# Patient Record
Sex: Male | Born: 1935 | Race: White | Hispanic: No | Marital: Married | Smoking: Former smoker
Health system: Southern US, Community
[De-identification: ages and names within clinical notes are randomized; demographics above are authoritative.]

## PROBLEM LIST (undated history)

## (undated) DIAGNOSIS — R410 Disorientation, unspecified: Secondary | ICD-10-CM

## (undated) DIAGNOSIS — J9811 Atelectasis: Secondary | ICD-10-CM

## (undated) DIAGNOSIS — I214 Non-ST elevation (NSTEMI) myocardial infarction: Secondary | ICD-10-CM

## (undated) DIAGNOSIS — R55 Syncope and collapse: Secondary | ICD-10-CM

## (undated) DIAGNOSIS — J45909 Unspecified asthma, uncomplicated: Secondary | ICD-10-CM

## (undated) DIAGNOSIS — F0390 Unspecified dementia without behavioral disturbance: Secondary | ICD-10-CM

## (undated) DIAGNOSIS — F419 Anxiety disorder, unspecified: Secondary | ICD-10-CM

## (undated) DIAGNOSIS — R0602 Shortness of breath: Secondary | ICD-10-CM

## (undated) DIAGNOSIS — Z9981 Dependence on supplemental oxygen: Secondary | ICD-10-CM

## (undated) DIAGNOSIS — J189 Pneumonia, unspecified organism: Secondary | ICD-10-CM

## (undated) DIAGNOSIS — J449 Chronic obstructive pulmonary disease, unspecified: Secondary | ICD-10-CM

## (undated) DIAGNOSIS — I10 Essential (primary) hypertension: Secondary | ICD-10-CM

## (undated) HISTORY — PX: INGUINAL HERNIA REPAIR: SUR1180

## (undated) HISTORY — DX: Essential (primary) hypertension: I10

## (undated) HISTORY — DX: Disorientation, unspecified: R41.0

## (undated) HISTORY — DX: Syncope and collapse: R55

## (undated) HISTORY — DX: Atelectasis: J98.11

## (undated) HISTORY — DX: Non-ST elevation (NSTEMI) myocardial infarction: I21.4

## (undated) HISTORY — PX: CARDIAC CATHETERIZATION: SHX172

## (undated) HISTORY — DX: Pneumonia, unspecified organism: J18.9

---

## 2003-12-18 ENCOUNTER — Inpatient Hospital Stay (HOSPITAL_COMMUNITY): Admission: EM | Admit: 2003-12-18 | Discharge: 2003-12-20 | Payer: Self-pay | Admitting: Emergency Medicine

## 2003-12-19 ENCOUNTER — Encounter: Payer: Self-pay | Admitting: Cardiology

## 2004-01-04 ENCOUNTER — Inpatient Hospital Stay (HOSPITAL_COMMUNITY): Admission: EM | Admit: 2004-01-04 | Discharge: 2004-01-11 | Payer: Self-pay | Admitting: Emergency Medicine

## 2004-01-13 ENCOUNTER — Emergency Department (HOSPITAL_COMMUNITY): Admission: AD | Admit: 2004-01-13 | Discharge: 2004-01-13 | Payer: Self-pay | Admitting: Family Medicine

## 2004-02-11 ENCOUNTER — Inpatient Hospital Stay (HOSPITAL_COMMUNITY): Admission: EM | Admit: 2004-02-11 | Discharge: 2004-02-13 | Payer: Self-pay

## 2004-06-28 ENCOUNTER — Encounter: Admission: RE | Admit: 2004-06-28 | Discharge: 2004-06-28 | Payer: Self-pay | Admitting: Family Medicine

## 2004-07-14 ENCOUNTER — Encounter: Admission: RE | Admit: 2004-07-14 | Discharge: 2004-07-14 | Payer: Self-pay | Admitting: Family Medicine

## 2004-12-06 ENCOUNTER — Encounter: Admission: RE | Admit: 2004-12-06 | Discharge: 2004-12-06 | Payer: Self-pay | Admitting: Family Medicine

## 2006-01-19 ENCOUNTER — Encounter: Admission: RE | Admit: 2006-01-19 | Discharge: 2006-01-19 | Payer: Self-pay | Admitting: Family Medicine

## 2006-06-21 ENCOUNTER — Encounter: Admission: RE | Admit: 2006-06-21 | Discharge: 2006-06-21 | Payer: Self-pay | Admitting: Family Medicine

## 2008-02-10 ENCOUNTER — Emergency Department (HOSPITAL_COMMUNITY): Admission: EM | Admit: 2008-02-10 | Discharge: 2008-02-11 | Payer: Self-pay | Admitting: Emergency Medicine

## 2008-02-16 ENCOUNTER — Ambulatory Visit: Payer: Self-pay | Admitting: Internal Medicine

## 2008-02-16 ENCOUNTER — Inpatient Hospital Stay (HOSPITAL_COMMUNITY): Admission: EM | Admit: 2008-02-16 | Discharge: 2008-02-18 | Payer: Self-pay | Admitting: Emergency Medicine

## 2009-02-03 IMAGING — CT CT ANGIO CHEST
2 of 4 series · 18 of 36 positions shown · IV contrast (100 ML OMNI 300)
Comparison: Today's plain film.  Chest CT 01/05/2004.

CLINICAL DATA: *Fever; ; DYSPNEA.  WHEEZING.  COPD.  FEVER.
PNEUMONIA.

CT Angiography of the Chest.
TECHNIQUE: Multidetector CT angiography of the chest was performed
after contrast with bolus timed to evaluate the pulmonary arteries.
Contrast:   100 ml Imnipaque-QMM

[Series 2: pe · axial · 0.70mm/px · z∈[-322,-40]mm · 15 of 256 slices shown]
[im 15/256  lung]
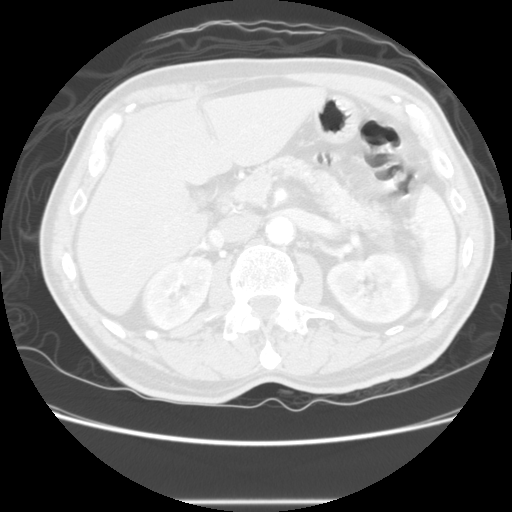
[im 29/256  mediastinal]
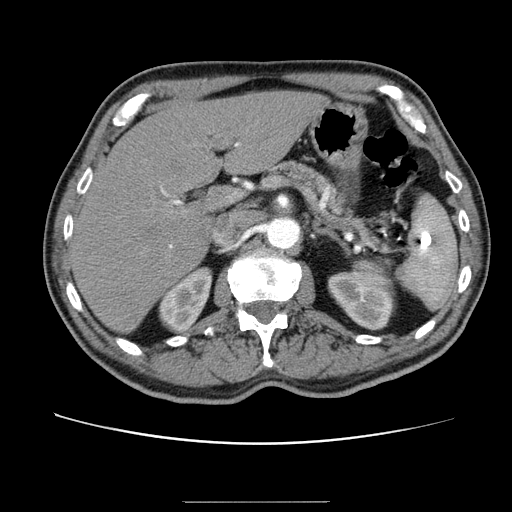
[im 43/256  lung]
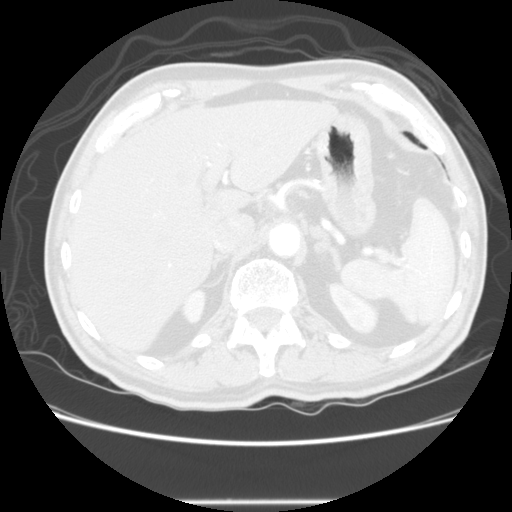
[im 57/256  mediastinal]
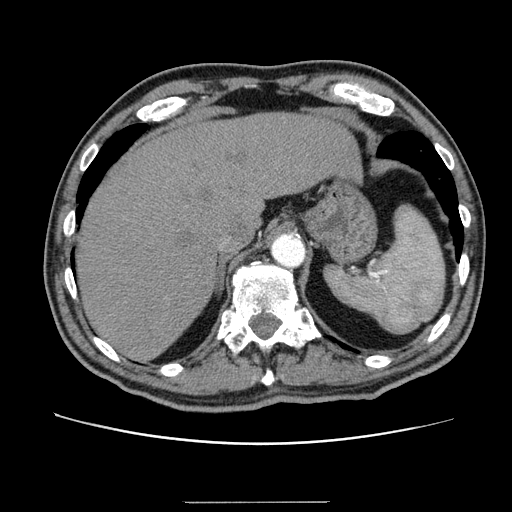
[im 86/256  lung]
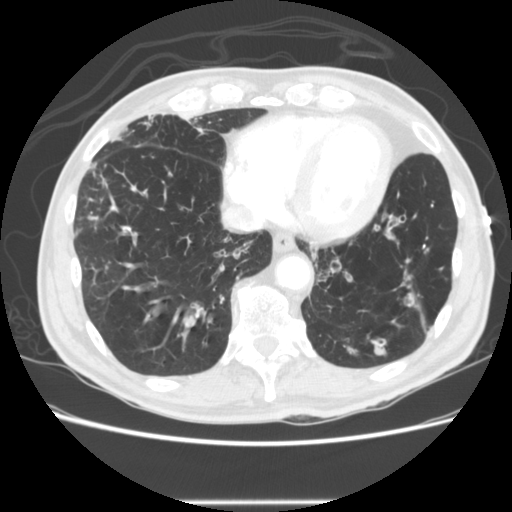
[im 100/256  mediastinal]
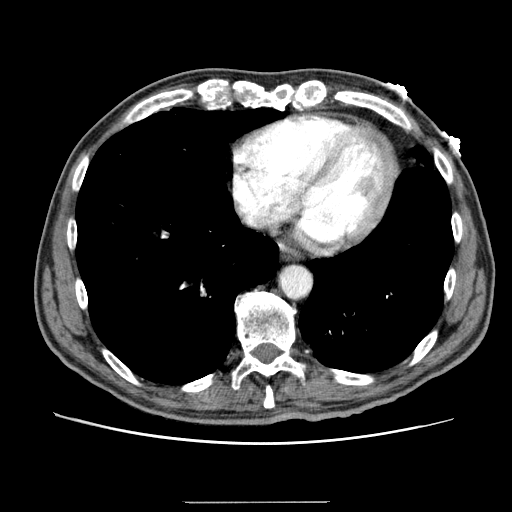
[im 114/256  lung]
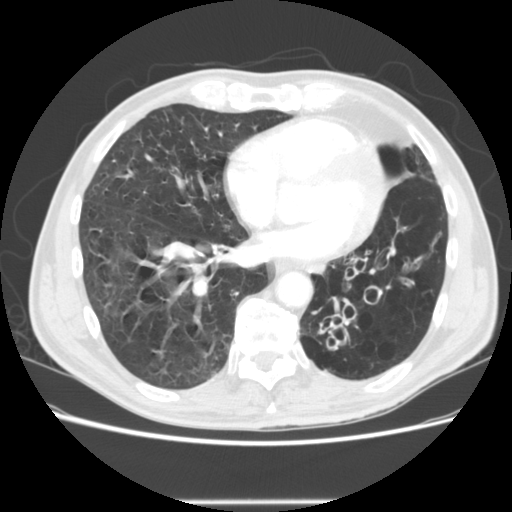
[im 128/256  mediastinal]
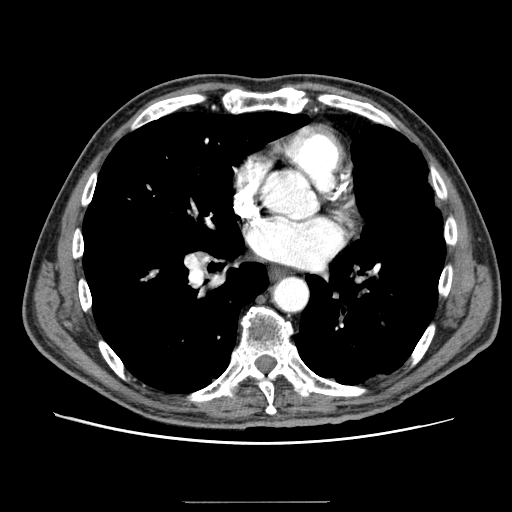
[im 142/256  lung]
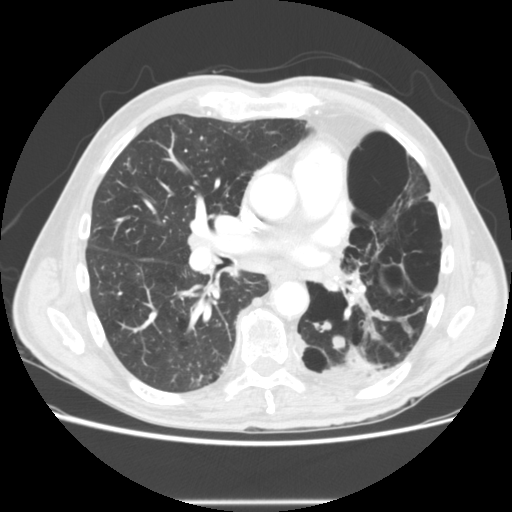
[im 156/256  mediastinal]
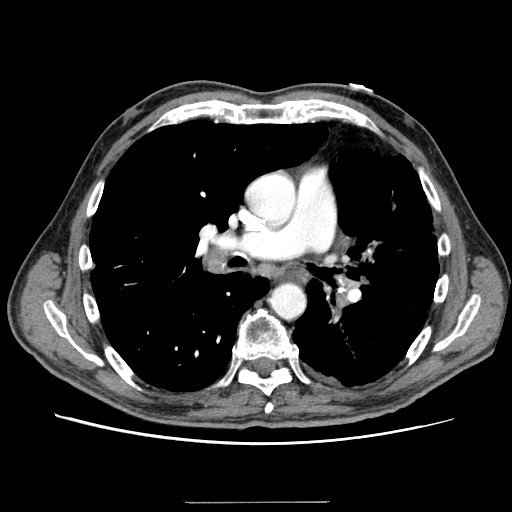
[im 171/256  lung]
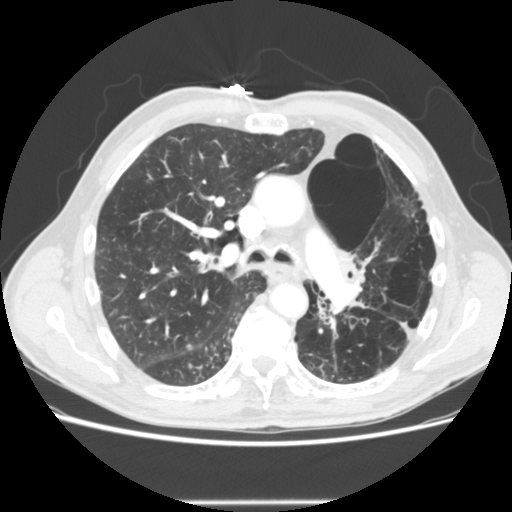
[im 199/256  mediastinal]
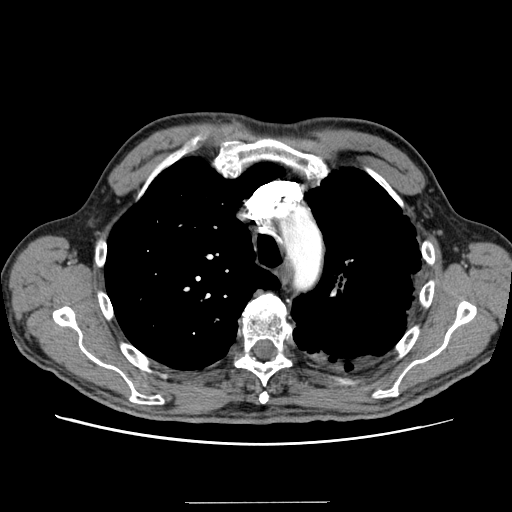
[im 213/256  lung]
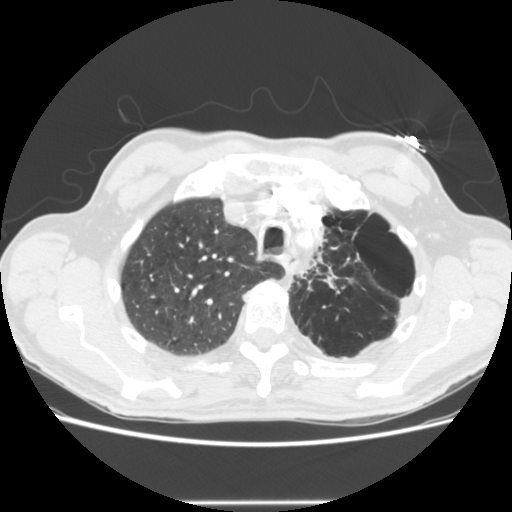
[im 227/256  mediastinal]
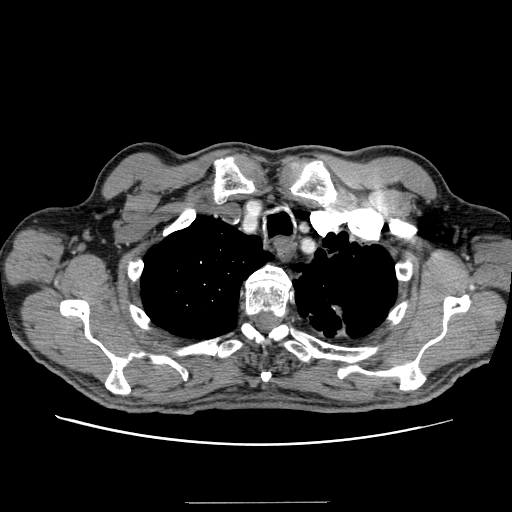
[im 241/256  lung]
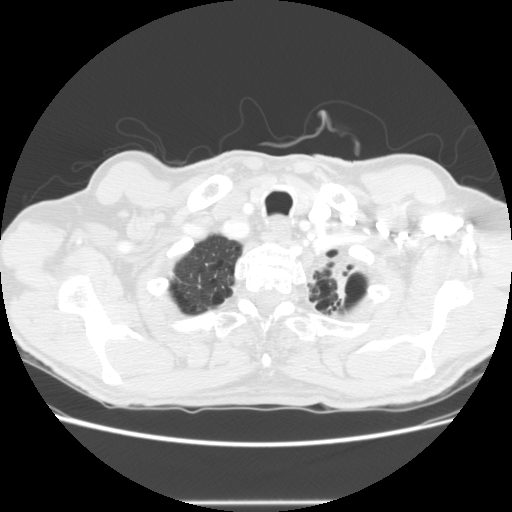

[Series 202: reformatted · coronal · 0.70mm/px · 3 of 123 slices shown]
[im 25/123  mediastinal]
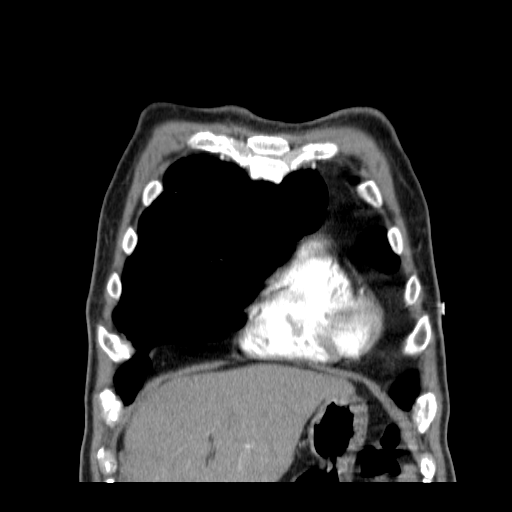
[im 49/123  mediastinal]
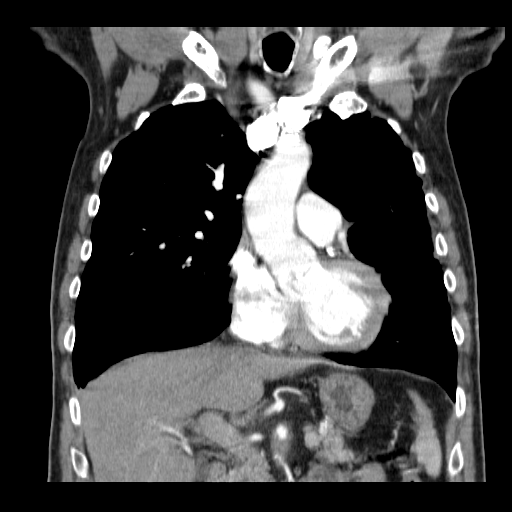
[im 74/123  mediastinal]
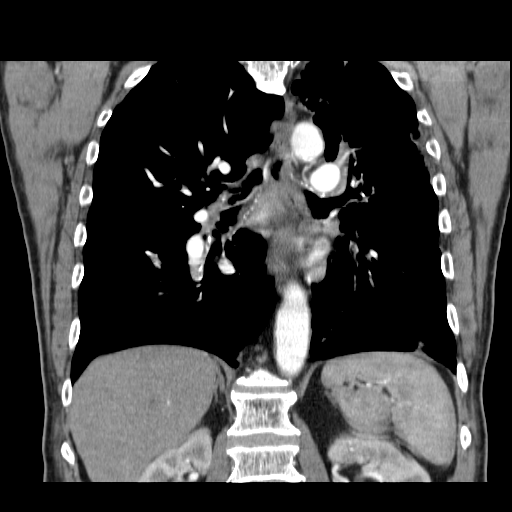

[18 of 36 positions shown; findings below may reference images not displayed]

FINDINGS: Lung windows demonstrate mild to moderate motion
artifact.  Bronchial wall thickening greater on the left than
right.

Severe central lobular emphysema.  scattered ill-defined nodules in
the right upper lobe and right middle lobe are likely infectious in
etiology.

Progressive left apical bullous disease.  Pleural parenchymal scar
with a new left apical nodular component measuring 8 mm on image
17.

A focal opacity along the posterior left upper lobe medially
adjacent to the left pulmonary artery measures 2.1 x 2.0 cm on
image 40.  Less mass-like on sagittal image 112.

Severe cylindrical bronchiectasis again identified and.  Bilateral
left greater than right lower lobe fluid-filled bronchi.

Soft tissue windows:  The quality of this exam for evaluation of
pulmonary embolism is moderate.  The primary limitation is motion
artifact as described above.  No evidence of pulmonary embolism.

 Normal aortic caliber without dissection.  Normal heart size
without pericardial or pleural effusion.  Extensive coronary artery
atherosclerosis.  Prominent mediastinal lymph nodes without
adenopathy.  1.6 cm right hilar lymph node image 60 is similar to
on the prior exam.

Limited abdominal imaging demonstrates old granulomatous disease in
the liver and spleen.  Moderate atherosclerosis at the proximal
SMA.

Normal bones.
IMPRESSION: 1.  Moderate quality exam without evidence of embolism.
2.  Severe central lobular emphysema with probable superimposed
acute infection.
3.  Bilateral bronchiectasis likely relate to chronic infection
and/or aspiration.
4.  Possible posterior left upper lobe lung nodule versus
suprahilar adenopathy.  When the patient is clinically improved,
consider further evaluation with outpatient PET.  If PET is not
performed, follow-up chest CT at 2- 3 months is recommended.
5.  Nodular opacity at left apex is likely related to pleural
parenchymal scar.  This could also be further evaluated with PET.
6.  Prominent mediastinal and hilar lymph nodes are stable and
likely reactive.

## 2011-03-22 NOTE — H&P (Signed)
[name], [name]                 ACCOUNT NO.:  [id number]   MEDICAL RECORD NO.:  [id number]          PATIENT TYPE:  INP   LOCATION:  4731                         FACILITY:  [facility]   PHYSICIAN:  Donalynn Furlong, MD      DATE OF BIRTH:  Aug 08, 1932   DATE OF ADMISSION:  02/16/2008  DATE OF DISCHARGE:                              HISTORY & PHYSICAL   PRIMARY CARE PHYSICIAN:  [name], M.D.   CHIEF COMPLAINT:  Shortness of breath.   HISTORY OF PRESENT ILLNESS:  [name] is a 77 year old male who lives  with his wife. He came to [facility] ER tonight with a complaint of  worsening shortness of breath for the last few days. He does have a  chronic cough production which is getting worse now also. He has a green  colored sputum which has increased recently. According to family, he may  have fever at home, it has not been documented. The patient has refused  to have chest pain at home. The patient was confused at the time of  encounter, so the patient was obtained from the family members. The  family members denied to have nausea, vomiting, diarrhea, abdominal  pain, blood in the stool, constipation, black color stools, urinary  complaints recently. They also denied him to have chest pain. The  patient lives with wife, and his wife was not present at the time of  encounter.   PAST MEDICAL HISTORY:  1. COPD.  2. Emphysema.  3. History of tobacco use.  4. Right-sided inguinal hernia repair.   PAST SURGICAL HISTORY:  As above.   MEDICATIONS:  Please review the reconciliation note done by nurses in  the ER.   ALLERGIES:  No known drug allergies.   REVIEW OF SYSTEMS:  Positive as per HPI. Otherwise negative. Review of  systems done for 14 systems.   FAMILY HISTORY:  Nothing contributory.   SOCIAL HISTORY:  The patient lives with his wife. He has a history of  tobacco use. No recent alcohol, drug, tobacco use.   PHYSICAL EXAMINATION:  VITAL SIGNS:  Blood pressure 154/76,  pulse 115,  respirations 18, temperature 101.4. Oxygen saturation 91% on room air.  GENERAL EXAM:  Alert, confused, sleeping in the bed.  HEAD:  Normocephalic, nontraumatic. EYES:  Pupils are equal, round, and  reactive to light and accommodation. Extraocular muscles intact. ORAL  CAVITY:  Oral mucosa dry. No thrush noted. Dental caries noted.  CARDIOVASCULAR:  S1/S2 irregular, tachycardic. No murmurs, rubs, or  gallops.  LUNGS:  The patient is tachypneic with bilateral air entry. The patient  has crepitus on the left side of the lung.  ABDOMEN:  Nontender, nondistended, bowel sounds present.  EXTREMITIES:  No clubbing, cyanosis, or edema. Pulses palpable in all  four extremities.  NECK:  No thyromegaly or JVD.  SKIN:  No rash or bruit.  NEUROLOGICAL EXAM:  Shows intact cranial nerves, muscular strength, and  sensation.   LABORATORY DATA:  EKG:  Heart rate 110 per minute, sinus tachycardia,  incomplete right bundle branch block, no acute ST-T  changes. Chest x-ray  with severe COPD and chronic bronchitis, increased density in the left  upper lung with possible soft tissue fullness in the AP window,  suspicion for infection versus adenopathy. Basic metabolic panel  unremarkable except for sodium 134, chloride 94, BUN 12, glucose 116. B-  type natriuretic peptide 40. CBC with differential unremarkable except  WBC 12.8.   ASSESSMENT AND PLAN:  1. Left-sided pneumonia with symptoms of worsening shortness of      breath, cough with venous sputum, subjective fever, leukocytosis,      and x-ray changes. Most like community acquired in origin.  2. Acute exacerbation of chronic obstructive pulmonary disease.  3. Leukocytosis.  4. History of tobacco use.  5. History of chronic obstructive pulmonary disease.   PLAN:  Will admit to telemetry bed under Dr. Rito Ehrlich with a diagnosis  of pneumonia and COPD exacerbation. Will keep him n.p.o. except for  medications, ice chips, and water. Will  check CBC, CMP, and magnesium  phosphate and EKG in the morning. We will get dysphagia consult in the  morning. Will start him on IV antibiotics, Rocephin and azithromycin,  for coverage of community-acquired pneumonia. Will also start him on IV  normal saline for hydration. Will start him on breathing treatment with  albuterol, ipratropium. Will get CT chest with and without contrast to  rule out PE and for diagnosis of pneumonia. Will start him on full-dose  Lovenox until he rules out for PE. Will place PPD. Will get fluid swab  and will get urine legionella antigen also. Will also pan culture him at  this point. Further plan and workup according to the labs and workup  pending.      Donalynn Furlong, MD  Electronically Signed     TVP/MEDQ  D:  02/16/2008  T:  02/16/2008  Job:  [id number]   cc:   [name], M.D.

## 2011-03-22 NOTE — Discharge Summary (Signed)
NAMELORRIN, NAWROT                 ACCOUNT NO.:  [id number]   MEDICAL RECORD NO.:  [id number]          PATIENT TYPE:  INP   LOCATION:  4731                         FACILITY:  [facility]   PHYSICIAN:  Hollice Espy, M.D.DATE OF BIRTH:  [birth year]   DATE OF ADMISSION:  02/16/2008  DATE OF DISCHARGE:  02/18/2008                               DISCHARGE SUMMARY   PRIMARY CARE PHYSICIAN:  Molly Maduro R. Manus Gunning, MD   DISCHARGE DIAGNOSES:  1. Chronic obstructive pulmonary disease exacerbation.  2. Underlying pneumonia, community-acquired.  3. Hypertension.  4. Mild delirium episode felt to be secondary to hypoxia and hospital      psychosis.   DISCHARGE MEDICATIONS:  The patient will continue on his oxygen 2 liters  daily.  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  Other medications for this  patient, he will continue all of his previous medications, albuterol  nebulizer respiratory treatment daily, hydrochlorothiazide 12.5 p.o.  daily, Benadryl 25 p.o. daily, and multivitamin daily.  New medications,  Avelox 400 mg p.o. daily x5 days, prednisone taper 50 mg p.o. b.i.d. on  day 1, 40 mg p.o. b.i.d. on day 2, and so on until complete and finally  albuterol inhaler 2 puffs four times a day as needed.  This is being  given to the patient so that he will have an inhaler for him at work.   The patient's overall disposition improved.  Activity will be slowly  increased.  He is not advised to return back to work until February 22, 2008.  Discharge diet is a low-sodium diet.   HISTORY OF PRESENT ILLNESS:  The patient is 77 year old white male with  past medical history of COPD on 2 liters of oxygen at times who  presented with several days of shortness of breath, wheezing, and cough  with yellowish  sputum.  When he first presented, he looked to be  markedly short of breath.  His oxygen was increased.  He was wheezing.  So, we started on IV steroids, nebulizer treatments, and IV antibiotics.  A chest x-ray showed signs of questionable infiltrate and he was put on  antibiotics for bronchitis versus pneumonia.  Over the next several days  the patient had dramatic improvement, and by February 18, 2008, he was  breathing comfortably moving much air through his Solu-Medrol and was  able to be on hospital day 2 changed from IV down to p.o. prednisone.  The patient had a brief episode of anxiety with some mild confusion,  which after an evaluation was felt to be secondary to a combination of  being briefly off his oxygen while he went to the bathroom, some  nighttime hospital psychosis and possibly from IV steroids, although by  February 18, 2008, he looked to be completely alert and oriented.  The  patient's overall disposition is improved.  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  He tells me he will indeed  do so.  His daughter is present.  She said that she will help with him  as well.   PLAN:  Will be for the patient to be discharged to home with follow up  with his PCP, Dr. Blair Heys in the next 10-14 days.      Hollice Espy, M.D.  Electronically Signed     SKK/MEDQ  D:  02/18/2008  T:  02/19/2008  Job:  [id number]   cc:   Bryan Lemma. Manus Gunning, M.D.

## 2011-03-25 NOTE — Discharge Summary (Signed)
[name], [name]                             ACCOUNT NO.:  [id number]   MEDICAL RECORD NO.:  [id number]                   PATIENT TYPE:  INP   LOCATION:  4740                                 FACILITY:  [facility]   PHYSICIAN:  Ara D. Metjian, M.D.                DATE OF BIRTH:  May 10, 1932   DATE OF ADMISSION:  12/18/2003  DATE OF DISCHARGE:  12/20/2003                                 DISCHARGE SUMMARY   PRIMARY CARE PHYSICIAN:  Dr. [name] (has not seen in 5 years).   FINAL DIAGNOSES:  1. Acute exacerbation of chronic obstructive pulmonary disease.  2. Tobacco dependent.  3. Status post right hernia repair in the 1970s.   PROCEDURE:  1. Chest x-ray performed December 18, 2003 showing probable bronchiectasis     involving the left lower lobe to the greatest degree.  2. Ultrasound of the abdomen performed December 19, 2003 showing calcified     granuloma associated with liver and spleen, otherwise completely normal     abdominal ultrasound.  3. A 2-D echocardiogram performed December 19, 2003 showing overall normal     left ventricular systolic function with left ventricular ejection     fraction 55-65%. There were no left ventricular regional wall motion     abnormalities.  Aortic valve thickness was mildly increased. Right     ventricle was mildly dilated. Right atrium was mildly dilated.   </LABORATORY DATA/TEST RESULTS>  ABG in the emergency room:  A pH 7.364, pCO2 55.3, bicarb 31.5. Sodium 140,  potassium 4.6, chloride 105, carbon dioxide 27, BUN 11, creatinine 0.8,  glucose 148, calcium 9.0, magnesium 2.5.  Hemoglobin A1c 5.1.  TSH 0.552.   SUMMARY OF HOSPITAL COURSE:  The patient is a very pleasant 77 year old  white male with past medical history as listed above who presented to the  emergency room with increased shortness of breath, increased cough and  purulent sputum.   PROBLEM NO. 1.  NEUROPSYCHIATRIC:  There were no signs of symptoms of  meningitis, encephalitis or  CVA.  There were no in-hospital episodes of  acute mania or psychosis. The patient appeared euthymic. While he was in-  house, extensive counseling was given regarding the need for tobacco  cessation, as well as the need for the patient to increase the level of  attention regarding his healthcare.   PROBLEM NO. 2.  PULMONARY:  Based upon his history and physical exam  findings, patient's admitting symptoms were consistent with acute  exacerbation of chronic obstructive pulmonary disease.  He was admitted on  telemetry, given moxifloxacin and initial dose of IV steroids followed by  p.o. dosing, aggressive nebulized treatments and supplemental oxygen.  The  patient continued to have marked improvement over the course of his  admission and on the day of discharge, the patient was able to perform a  brisk 6 minute walk with oxygen saturation of 93%.  The patient stated that  this was the most that he had been able to ambulate without symptoms in  quite sometime.   PROBLEM NO. 3.  CARDIOVASCULAR:  The patient had negative cardiac enzymes  when initially evaluated in the emergency room.  He was maintained on  telemetry without any arrhythmias appreciated.   PROBLEM NO. 4.  RENAL:  No active issues.   PROBLEM NO. 5.  GASTROINTESTINAL:  Patient does complain of increased  shortness of breath when he eats a big meal.  To evaluate for possible  organomegaly, the patient did undergo an abdominal ultrasound which was  unremarkable, except for the 4 x 4 x 6 mm granuloma seen within his liver,  and the 4 x 7 x 8 granuloma which was seen in his spleen.   PROBLEM NO. 6.  FLUIDS, ELECTROLYTES, AND NUTRITION:  Patient's IV fluids  were med-locked.  His electrolytes were within normal limits and he was  started on an AHA-1 diet.   PROBLEM NO. 7.  INFECTIOUS DISEASE:  Patient remained afebrile without  leukocytosis for the duration of his admission.  He was started on  moxifloxacin for his COPD  exacerbation.   PROBLEM NO. 8.  PROPHYLAXIS;  He was full p.o. for GI prophylaxis and was  ambulatory for DVT prophylaxis, as well being placed on low molecular weight  heparin.   DISPOSITION:  Greater than 30 minutes was spent with the patient discussing  his need for tobacco cessation and follow up with primary care medicine.  He  is being discharged home in the care of his family in much improved  condition.  The patient is a full code.   DISCHARGE MEDICATIONS:  1. Aspirin 81 mg p.o. daily.  2. Fluticasone/salmeterol 100/50 mcg 1 puff b.i.d.  3. Moxifloxacin 400 mg p.o. daily x6 days.  4. Prednisone 60 mg p.o. x2 days, then 40 mg p.o. x4 days, then 20 mg p.o.     x4 days, then 10 mg p.o. x4 days.  5. Albuterol/ipratropium metered-dose inhaler 2 puffs q.6h. p.r.n. shortness     of breath/wheeze only.   DISCHARGE INSTRUCTIONS:  1. The patient is to take his medications as prescribed.  2. He is to have a low salt, low fat, low sugar diet.  3. He is to make an appointment with his primary care physician in 2-4     weeks.  4. He is to get copies of his history and physical and discharge summary for     presentation to his primary care physician.  5. Stop all tobacco products.  6. Patient to return if he feels worse.                                                Ara D. Tammi Klippel, M.D.    ADM/MEDQ  D:  12/20/2003  T:  12/20/2003  Job:  [id number]   cc:   [name], Dr.

## 2011-03-25 NOTE — H&P (Signed)
NAMEFRIEND, DORFMAN                             ACCOUNT NO.:  [id number]   MEDICAL RECORD NO.:  [id number]                   PATIENT TYPE:  EMS   LOCATION:  MAJO                                 FACILITY:  [facility]   PHYSICIAN:  Ara D. Metjian, M.D.                DATE OF BIRTH:  May 12, 1932   DATE OF ADMISSION:  12/18/2003  DATE OF DISCHARGE:                                HISTORY & PHYSICAL   PRIMARY CARE PHYSICIAN:  Unassigned.   CHIEF COMPLAINT:  Shortness of breath, increased purulent sputum.   HISTORY OF PRESENT ILLNESS:  The patient is a pleasant 77 year old white  male who states for the past two to three weeks, he has had increasing  shortness of breath and cough of purulent sputum that has changed from clear  to green in color. He states that his dyspnea has gone to the point where he  has to sleep upright in the chair, although, he has no feelings of being  smothered or when he is lying flat.  These symptoms have been progressive  over the past two to three weeks and are exacerbated by movement and being  supine.   ALLERGIES:  No known drug allergies.   MEDICATIONS:  Multivitamin one a day.   PAST MEDICAL HISTORY:  1. COPD.  2. Tobacco dependence.   PAST SURGICAL HISTORY:  Status post right hernia repair in the 70's.   SOCIAL HISTORY:  The patient admits to greater than 125-pack-year smoking  history, none in the past 15 years, but has taken up chewing tobacco in its  place for the past 15 years.  Denies any alcohol, illicit or IV drug use. He  lives in [place] with his wife. They have no pets.  They have well water.  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   FAMILY HISTORY:  Mother deceased at age 110 from unknown causes.  Father  deceased at age 41 from an MI.  Brother deceased at age 54 from end-stage  liver disease from alcohol. Brother deceased at age 42 from lung cancer from  tobacco abuse.  Brother alive at age 19 with mild Alzheimer's.  Two  sisters  alive in their 18's, who are healthy.  Sister alive at age 14 and healthy.  One son alive at age 77, healthy.  One daughter alive at age 12 and healthy.   REVIEW OF SYSTEMS:  Positive for purulent sputum, cold intolerance, and  weight loss approximately 40 to 50 pounds in the past two years.  The  patient denies headache, visual acuity changes, situation scotomas,  diplopia, vertigo, epistaxis, oral lesions or ulcers, dysphagia, or  odynophagia, shortness of breath at rest, hemoptysis, exposure to people  with tuberculosis, wheeze, chest pain at rest or upon exertion, nausea,  vomiting, hematemesis or coffee ground emesis, bright red blood per rectum,  melena, diarrhea, [place] stools, dysuria,  polyuria, hematuria, nocturia,  myalgias, arthralgias, fevers, chills, sweats, skin rashes, palpitations,  GERD symptoms, weakness, or numbness.   PHYSICAL EXAMINATION:  VITAL SIGNS:  Temperature 97.1, pulse 64,  respirations 20, blood pressure 124/82, pulse oximetry 88 to 94% on room  air, 90% on 2 liters nasal cannula.  GENERAL:  Well-developed, well-nourished, but thin 77 year old white male  speaking in complete sentences in no acute distress.  HEENT:  Head normocephalic and atraumatic with male pattern baldness.  Eyes;  pupils equal, round, and reactive to light, extraocular muscles are intact,  anicteric. No injection and no discharge.  Normal appearing conjunctivae.  Nose; no dried blood in the nares.  Mouth; moist mucous membranes, uvula  midline. Oropharynx without erythema or exudate with dentition in awful  repair.  NECK:  Supple without lymphadenopathy, thyromegaly, bruit, or JVD. There are  no meningeal signs.  LUNGS:  Clear to percussion bilaterally with expanded lung volumes.  There  are diffuse inspiratory and expiratory wheezes in all lung fields.  There  are no rhonchi or crackles appreciated.  HEART:  Regular rate and rhythm, S1 and S2, but distant with equal and   nondelayed carotid upstrokes.  ABDOMEN:  Nondistended.  Bowel sounds present and nontender.  No guarding or  rebound.  No hepatosplenomegaly.  No pulsatile masses, no abdominal bruits.  EXTREMITIES:  No cyanosis, clubbing, or edema.  SKIN:  Face consistent with chronic sun exposure, most likely solar  keratoses. No palpable purpura, no petechiae, no rashes appreciated.  NEUROLOGY:  Cranial nerves II-XII grossly intact.  Strength is 5/5 in all  extremities and axial groups. There is no sensory or motor focal or gross  deficits appreciated.   LABORATORY DATA:  White blood cell count 13.4, H&H 14.4 and 42.7, platelet  count 319, pH 7.364, pCO2 55.3, bicarb 31.5, sodium 137, potassium 4.4,  chloride 101, carbon dioxide 9, creatinine 1.1, glucose 116.  Troponin is  less than 0.05.   EKG is normal sinus rhythm at a rate of 79 with normal PR, QRS, and QTC  intervals and normal axis at 81 degrees. There are no ST segment changes.  There are no T wave inversions.  There are no Q waves.  There is good R wave  progression throughout the precordium. There is no prior EKG available for  comparison.   Chest x-ray shows hyperinflated lungs with prominent interstitial markings  and hilar fullness consistent with the patient's diagnosis of COPD.   ASSESSMENT:  A 77 year old white male with an acute exacerbation of COPD and  tobacco dependence.   Problem 1.  Neuropsyche. There are no signs or symptoms of meningitis,  encephalitis, or CVA.  The patient appears euthymic at this time.  Will  encourage tobacco cessation.   Problem 2.  Pulmonary.  The patient's signs and symptoms are consistent with  acute exacerbation of COPD.  Will keep the patient on 2 liters nasal cannula  oxygen overnight without a wean, q.6h albuterol and ipratropium nebulizers  as well as albuterol p.r.n., encourage aggressive pulmonary toilette, and treat with moxifloxacin 400 mg IV followed by p.o.  He will receive a dose  of  125 mg of methylprednisolone followed by a steroid taper over the next  two weeks or prednisone.  The patient has been counseled extensively on the  need for tobacco cessation.  We will see whether or not the patient  qualifies for home oxygen.   Problem 3.  Cardiovascular.  Start the patient on 81 mg of  aspirin and  monitor his blood pressure and pulse.   Problem 4.  Renal.  No active issues.   Problem 5.  GI.  No active issues.   Problem 6.  Fluids, electrolytes, and nutrition.  Will med-lock the  patient's IV fluids, will monitor his potassium and magnesium, and start the  patient on a regular diet.   Problem 7.  ID.  No active issues.   Problem 8.  Hem/onc.  No active issues.   Problem 9.  Endocrine.  Check a TSH and A1C.   Problem 10.  Prophylaxis. The patient will be full p.o. for GI prophylaxis  and will start him on low molecular weight heparin for DVT prophylaxis.   DISPOSITION:  The patient is a full code.                                                Ara D. Tammi Klippel, M.D.    ADM/MEDQ  D:  12/18/2003  T:  12/18/2003  Job:  [id number]

## 2011-03-25 NOTE — Discharge Summary (Signed)
[name], [name]                             ACCOUNT NO.:  [id number]   MEDICAL RECORD NO.:  [id number]                   PATIENT TYPE:  INP   LOCATION:  5735                                 FACILITY:  [facility]   PHYSICIAN:  Corinna L. [name], MD             DATE OF BIRTH:  02-11-1932   DATE OF ADMISSION:  02/11/2004  DATE OF DISCHARGE:  02/13/2004                                 DISCHARGE SUMMARY   DIAGNOSES:  1. Chronic obstructive pulmonary disease exacerbation.  2. Bronchiectasis.  3. Hypertension.  4. Seasonal allergic rhinitis.  5. History of anemia.   DISCHARGE MEDICATIONS:  1. Albuterol, Atrovent nebulizer q.4 hours as needed for wheezing or     shortness of breath has been arranged for home use, otherwise he can use     Combivent every four hours as needed.  2. Prednisone taper.  3. Azithromycin 250 mg p.o. for three more days.  4. Allegra 60 mg p.o. b.i.d.  5. Ambien 5 mg p.o. q.h.s. as needed for insomnia while on steroids.  6. Advair 250/50 one puff b.i.d.   He is to continue his other outpatient medications which include:  1. Hydrochlorothiazide 12.5 mg p.o. daily.  2. Multivitamin.   He is to followup with Dr. Manus Gunning in 1-2 weeks.   ACTIVITY:  No heavy exertion for a week.   CONDITION AT DISCHARGE:  Stable.   DIET:  Low salt.   CONSULTATIONS:  None.   PROCEDURES:  None.   PERTINENT LABORATORIES:  Patient white blood cell count was 12,000, his  hemoglobin was 12, hematocrit 36, platelet count 438.  His BMET was  unremarkable.  Chest x-ray showed COPD with chronic bronchitis and  interstitial changes, nothing acute.  EKG showed sinus tachycardia with a  rate of 123, left anterior fascicular block, age indeterminate septal  infarct, incomplete right bundle branch block.   HISTORY AND HOSPITAL COURSE:  [name] is a 77 year old patient of Dr.  Manus Gunning who presented to the emergency room with a several day history of  worsening shortness of breath.   He had been hospitalized last month for a  COPD exacerbation.  He has a chronic cough but his sputum production has  been more copious and the color is darker.  He was having diaphoresis but no  fevers or chills.  On physical examination he had some tachycardia but  otherwise had good vital signs.  He was having diffuse wheezing and rhonchi.  Chest x-ray was negative for pneumonia.  He was given IV steroids and  admitted to the floor on oxygen.  He was given handheld nebulizer  treatments.  As this is the third hospitalization for the same problem in  the past few months, he has been set up with a home nebulizer machine.  Also, he described an allergic component to his wheezing and noted that the  shortness of breath and cough seemed  to worsen the other day when he was  exposed to a bit of pollen.  He also described some sinus congestion and  watery eyes.  I have, therefore, also started him on Allegra.  At the time  of discharge he was feeling much better, had no fevers, had normal oxygen  saturations and had no further wheezing, so he is being discharged to home.                                                Corinna L. [name], MD    CLS/MEDQ  D:  02/13/2004  T:  02/15/2004  Job:  [id number]   cc:   Bryan Lemma. Manus Gunning, M.D.  [address] [place]  [place] [address]  Fax: [phone]

## 2011-03-25 NOTE — Discharge Summary (Signed)
[name], [name]                             ACCOUNT NO.:  [id number]   MEDICAL RECORD NO.:  [id number]                   PATIENT TYPE:  INP   LOCATION:  5725                                 FACILITY:  [facility]   PHYSICIAN:  Ileana Roup, M.D.               DATE OF BIRTH:  [birth year]   DATE OF ADMISSION:  01/04/2004  DATE OF DISCHARGE:  01/11/2004                                 DISCHARGE SUMMARY   DISCHARGE DIAGNOSES:  1. Bronchiectasis.  2. Hypertension.  3. Chronic obstructive pulmonary disease.  4. Anemia.   DISCHARGE MEDICATIONS:  1. Augmentin 1 pill b.i.d. for 4 days.  2. Cipro 1 pill 2 times a day for 4 days.  3. Combivent inhaler 2 puffs b.i.d.  4. Advair  Diskus 1 puff b.i.d.  5. Albuterol inhaler 2 puffs q.4 h. p.r.n. wheeze.  6. Hydrochlorothiazide 12.5 mg 1 p.o. daily.  7. Multivitamin 1 p.o. daily.  8. Prednisone taper -- take as directed.   DISPOSITION:  Discharge to home.   FOLLOWUP:  Followup will be with Dr. Bryan Lemma. Ehinger of [facility] on Thursday, January 22, 2004, at 1:30 p.m.   PROCEDURES PERFORMED:  1. CT of the chest showing diffuse emphysema, left cystic bronchiectasis     most prominent in the left lower lobe, areas of soft tissue opacity,     mediastinal and bilateral hilar adenopathy.  2. Chest x-ray showing chronic obstructive pulmonary disease, central     peribronchial thickening and bronchiectasis, especially in the left upper     lobe segmental bronchi.   CONSULTATIONS:  Care Management.   ADMISSION HISTORY AND PHYSICAL:  This is a 77 year old gentleman recently  hospitalized for COPD exacerbation, presenting with worsening shortness of  breath and increased sputum production.  The patient had been discharged  with a prednisone taper, Avelox, Advair and Combivent, though was unable to  get these medications for 3-4 days, but after that, took them as directed.  Per the patient and his family, he felt well until 1-2 days  after discharge,  but then gradually declined until 3 days prior to his admission.   ALLERGIES:  No known drug allergies.   PAST MEDICAL HISTORY:  1. COPD.  2. Tobacco abuse.  3. Severe pneumonias as a child.   MEDICATIONS:  A prednisone taper and Avelox that were completed prior to his  admission.  Advair, Combivent and aspirin.   SOCIAL HISTORY:  He is married, a former 2-pack-per-day smoker for 30 years.  No alcohol or drugs.   PHYSICAL EXAM:  VITAL SIGNS:  Pulse 110, blood pressure 150/88, temperature  101.2, respirations 38, O2 saturation 94% on 3 L nasal cannula, but dropped  to 87% when talking.  GENERAL:  In general, lying in bed, some shortness of breath with talking,  appears comfortable.  HEENT:  Pupils equal, round and reactive to light.  Extraocular  motions  intact.  Mucous membranes moist but poor dentition.  Nares are clear.  Oropharynx is clear.  NECK:  Neck is supple with no lymphadenopathy.  LUNGS:  Poor air movement throughout.  He has expiratory wheezes and a  barrel-shaped chest.  CARDIOVASCULAR:  Tachycardia with distant heart sounds.  No murmurs, rubs or  gallops.  ABDOMEN:  Abdomen is soft and nontender with normoactive bowel sounds.  EXTREMITIES:  Extremities are without edema and 2+ distal pulses.  SKIN:  Skin is without rash.  MUSCULOSKELETAL:  Strength is 5/5 throughout.  NEUROLOGIC:  Cranial nerves II-XII grossly intact.   HOSPITAL COURSE:  PROBLEM #1 - LUNGS -- CHRONIC OBSTRUCTIVE PULMONARY  DISEASE AND BRONCHIECTASIS:  The patient was started on a course of  antibiotics for COPD exacerbation.  He did well on these medications and was  discharged home with a completion of Avelox.  He was also given nebulizer  breathing treatment and he began to do much better.  It should be noted that  he did have some increased liver function tests, an AST of 65 and ALT of 48;  no clear etiology was determined for these.  He was also treated for some  symptoms  of anxiety during his hospitalization and was doing well enough to  stop these at discharge.  His blood pressure was controlled with  hydrochlorothiazide.  The patient was discharged in improved condition.  He  will continue his flutter valve device at home, as well as his previously  mentioned breathing treatments.  It should be noted he did have a final  diagnosis of bronchopneumonia.  There is some discussion about whether the  gentleman would get home oxygen or not; reviewing his chart, I am unsure  whether or not this was provided to him and this should be discussed with  the patient by his primary care physician.      Ursula Beath, MD                     Ileana Roup, M.D.    JT/MEDQ  D:  01/29/2004  T:  02/01/2004  Job:  [id number]   cc:   Bryan Lemma. Manus Gunning, M.D.  [address] [place]  [place] [address]  Fax: [phone]

## 2011-03-25 NOTE — H&P (Signed)
NAMEVEASNA, SANTIBANEZ                             ACCOUNT NO.:  [id number]   MEDICAL RECORD NO.:  [id number]                   PATIENT TYPE:  INP   LOCATION:  5735                                 FACILITY:  [facility]   PHYSICIAN:  Sherin Quarry, MD                   DATE OF BIRTH:  05-Aug-1932   DATE OF ADMISSION:  02/11/2004  DATE OF DISCHARGE:                                HISTORY & PHYSICAL   HISTORY OF PRESENT ILLNESS:  [name] is a 77 year old man who had just  been discharged from the hospital on January 11, 2004.  Mr. 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  Over the last week, he has experienced increased cough associated  with shortness of breath.  Tonight, the patient's daughter noted that he  seemed to be particularly short of breath.  He complained of a cough  productive of yellowish phlegm; for this reason, he was brought to the  emergency room, where initially he was markedly dyspneic but improved after  receiving an albuterol nebulizer treatment.  He has not had any recent  problems with fever, syncope, headache, chest pain, nausea or vomiting.  His  chest x-ray shows evidence of COPD but no acute findings.  In light of his  respiratory difficulties, he is admitted at this time for further treatment.   PAST MEDICAL HISTORY:   ALLERGIES:  No known drug allergies.   CURRENT MEDICATIONS:  1. Combivent 3 puffs 4 times daily.  2. Advair 1 puff b.i.d.  3. Albuterol p.r.n.  4. Hydrochlorothiazide 12.5 mg daily.  5. Apparently, he is also on  home oxygen.   MEDICAL ILLNESSES:  1. COPD.  2. Hypertension.  3. Bronchiectasis.   OPERATIONS:  He has had:  1. A previous hernia repair.  2. Arthroscopic knee surgery.  3. An eye operation.   FAMILY HISTORY:  Family history is notable for his mother who died at age 46  of old age.  His father died at age 49 of an MI.  His brother died at 42 of  cirrhosis of the  liver.  He has another brother who died at the age of 54  from lung cancer.   SOCIAL HISTORY:  The patient has a 125-pack-year smoking history.  He  discontinued cigarette smoking 15 years ago but continues to chew tobacco.  He denies drug or alcohol abuse.  He lives with his wife.   REVIEW OF SYSTEMS:  HEAD:  He denies headache or dizziness.  EYES:  He  denies visual blurring or diplopia.  EARS, NOSE AND THROAT:  Denies earache,  sinus pain or sore throat.  CHEST:  See above.  CARDIOVASCULAR:  He denies  exertional chest pain, orthopnea, PND or ankle edema.  GI:  He denies  nausea, vomiting, abdominal pain, change in bowel  habits, melena or  hematochezia.  GU:  He denies dysuria or urinary frequency.  NEUROLOGIC:  Denies seizure or stroke.  ENDOCRINE:  Denies excessive thirst, urinary  frequency or nocturia, heat or cold intolerance, skin or hair changes.   PHYSICAL EXAMINATION:  HEENT:  Exam is within normal limits.  CHEST:  The chest is remarkable for diminished breath sounds diffusely.  There is mild expiratory wheezing.  Rhonchi are noted at the bases.  CARDIOVASCULAR:  Exam reveals normal S1 and S2.  There are no rubs, murmurs  or gallops.  ABDOMEN:  The abdomen is benign -- there are normal bowel sounds -- without  masses, tenderness or organomegaly.  NEUROLOGIC:  Neurologic testing is within normal limits.  EXTREMITIES:  Examination of extremities is within normal limits.   LABORATORY AND ACCESSORY CLINICAL DATA:  Chest x-ray shows signs of COPD  with no acute infiltrates.   The PO2 was 286 on arterial blood gas; this was obtained on 100%.  The  sodium is 139, potassium 4.0, chloride 108, bicarbonate 27, glucose is 120.  BUN was 11.   IMPRESSION:  1. Acute exacerbation of chronic bronchitis.  2. Bronchiectasis.  3. Hypertension.  4. Chronic obstructive pulmonary disease.   PLAN:  The plan will be to admit the patient for further monitoring and  treatment.  He will be  administered oxygen.  He will receive nebulizer  treatments with albuterol and Atrovent.  Antibiotics will be given in the  form of Zithromax and Rocephin.  He will be given intravenous steroids.  He  will be monitored closely in an inpatient setting.                                                Sherin Quarry, MD    SY/MEDQ  D:  02/11/2004  T:  02/12/2004  Job:  [id number]   cc:   Bryan Lemma. Manus Gunning, M.D.  [address] [place]  [place] [address]  Fax: [phone]

## 2011-05-05 ENCOUNTER — Emergency Department (HOSPITAL_COMMUNITY): Payer: Medicare Other

## 2011-05-05 ENCOUNTER — Inpatient Hospital Stay (HOSPITAL_COMMUNITY)
Admission: EM | Admit: 2011-05-05 | Discharge: 2011-05-14 | DRG: 177 | Disposition: A | Discharge status: Home Health | Payer: Medicare Other | Attending: Internal Medicine | Admitting: Internal Medicine

## 2011-05-05 DIAGNOSIS — J479 Bronchiectasis, uncomplicated: Secondary | ICD-10-CM

## 2011-05-05 DIAGNOSIS — R404 Transient alteration of awareness: Secondary | ICD-10-CM | POA: Diagnosis present

## 2011-05-05 DIAGNOSIS — J690 Pneumonitis due to inhalation of food and vomit: Principal | ICD-10-CM | POA: Diagnosis present

## 2011-05-05 DIAGNOSIS — I10 Essential (primary) hypertension: Secondary | ICD-10-CM | POA: Diagnosis present

## 2011-05-05 DIAGNOSIS — E876 Hypokalemia: Secondary | ICD-10-CM | POA: Diagnosis present

## 2011-05-05 DIAGNOSIS — Z7982 Long term (current) use of aspirin: Secondary | ICD-10-CM

## 2011-05-05 DIAGNOSIS — I214 Non-ST elevation (NSTEMI) myocardial infarction: Secondary | ICD-10-CM | POA: Diagnosis present

## 2011-05-05 DIAGNOSIS — F17200 Nicotine dependence, unspecified, uncomplicated: Secondary | ICD-10-CM | POA: Diagnosis present

## 2011-05-05 DIAGNOSIS — F068 Other specified mental disorders due to known physiological condition: Secondary | ICD-10-CM | POA: Diagnosis present

## 2011-05-05 DIAGNOSIS — J9819 Other pulmonary collapse: Secondary | ICD-10-CM

## 2011-05-05 DIAGNOSIS — Z9981 Dependence on supplemental oxygen: Secondary | ICD-10-CM

## 2011-05-05 DIAGNOSIS — R55 Syncope and collapse: Secondary | ICD-10-CM

## 2011-05-05 DIAGNOSIS — R040 Epistaxis: Secondary | ICD-10-CM

## 2011-05-05 DIAGNOSIS — J439 Emphysema, unspecified: Secondary | ICD-10-CM | POA: Diagnosis present

## 2011-05-05 LAB — POCT I-STAT, CHEM 8
BUN: 11 mg/dL (ref 6–23)
Chloride: 100 mEq/L (ref 96–112)
Creatinine, Ser: 0.8 mg/dL (ref 0.50–1.35)
Glucose, Bld: 122 mg/dL — ABNORMAL HIGH (ref 70–99)
Potassium: 4.8 mEq/L (ref 3.5–5.1)
Sodium: 140 mEq/L (ref 135–145)

## 2011-05-05 LAB — PROTIME-INR: Prothrombin Time: 13.8 seconds (ref 11.6–15.2)

## 2011-05-05 LAB — DIFFERENTIAL
Basophils Absolute: 0 10*3/uL (ref 0.0–0.1)
Lymphocytes Relative: 7 % — ABNORMAL LOW (ref 12–46)
Lymphs Abs: 1 10*3/uL (ref 0.7–4.0)
Monocytes Absolute: 0.8 10*3/uL (ref 0.1–1.0)
Monocytes Relative: 6 % (ref 3–12)
Neutro Abs: 12.4 10*3/uL — ABNORMAL HIGH (ref 1.7–7.7)

## 2011-05-05 LAB — GLUCOSE, CAPILLARY
Glucose-Capillary: 119 mg/dL — ABNORMAL HIGH (ref 70–99)
Glucose-Capillary: 106 mg/dL — ABNORMAL HIGH (ref 70–99)

## 2011-05-05 LAB — CBC
HCT: 41.3 % (ref 39.0–52.0)
Hemoglobin: 13.6 g/dL (ref 13.0–17.0)
MCH: 28.1 pg (ref 26.0–34.0)
MCHC: 32.9 g/dL (ref 30.0–36.0)
MCV: 85.3 fL (ref 78.0–100.0)

## 2011-05-05 LAB — CK TOTAL AND CKMB (NOT AT ARMC): CK, MB: 6.7 ng/mL (ref 0.3–4.0)

## 2011-05-05 LAB — TROPONIN I: Troponin I: 1.95 ng/mL (ref <0.30)

## 2011-05-05 MED ORDER — IOHEXOL 300 MG/ML  SOLN
80.0000 mL | Freq: Once | INTRAMUSCULAR | Status: AC | PRN
  Administered 2011-05-05: 80 mL via INTRAVENOUS

## 2011-05-06 ENCOUNTER — Inpatient Hospital Stay (HOSPITAL_COMMUNITY): Payer: Medicare Other

## 2011-05-06 LAB — CBC
Hemoglobin: 11.6 g/dL — ABNORMAL LOW (ref 13.0–17.0)
MCH: 27.4 pg (ref 26.0–34.0)
MCV: 86.1 fL (ref 78.0–100.0)
Platelets: 250 10*3/uL (ref 150–400)
RBC: 4.23 MIL/uL (ref 4.22–5.81)
WBC: 11.3 10*3/uL — ABNORMAL HIGH (ref 4.0–10.5)

## 2011-05-06 LAB — GLUCOSE, CAPILLARY
Glucose-Capillary: 102 mg/dL — ABNORMAL HIGH (ref 70–99)
Glucose-Capillary: 95 mg/dL (ref 70–99)
Glucose-Capillary: 98 mg/dL (ref 70–99)

## 2011-05-06 LAB — EXPECTORATED SPUTUM ASSESSMENT W GRAM STAIN, RFLX TO RESP C

## 2011-05-06 LAB — BASIC METABOLIC PANEL
CO2: 31 mEq/L (ref 19–32)
Calcium: 8.4 mg/dL (ref 8.4–10.5)
Chloride: 100 mEq/L (ref 96–112)
Glucose, Bld: 98 mg/dL (ref 70–99)
Sodium: 138 mEq/L (ref 135–145)

## 2011-05-06 LAB — MAGNESIUM: Magnesium: 2.4 mg/dL (ref 1.5–2.5)

## 2011-05-07 ENCOUNTER — Inpatient Hospital Stay (HOSPITAL_COMMUNITY): Payer: Medicare Other

## 2011-05-07 DIAGNOSIS — J9819 Other pulmonary collapse: Secondary | ICD-10-CM

## 2011-05-07 LAB — CBC
Hemoglobin: 11.4 g/dL — ABNORMAL LOW (ref 13.0–17.0)
MCHC: 33 g/dL (ref 30.0–36.0)
Platelets: 250 10*3/uL (ref 150–400)
RBC: 4.09 MIL/uL — ABNORMAL LOW (ref 4.22–5.81)
RDW: 13.8 % (ref 11.5–15.5)
WBC: 11.7 10*3/uL — ABNORMAL HIGH (ref 4.0–10.5)

## 2011-05-07 LAB — ABO/RH: ABO/RH(D): AB POS

## 2011-05-07 LAB — BASIC METABOLIC PANEL
Calcium: 8.7 mg/dL (ref 8.4–10.5)
GFR calc Af Amer: >60 mL/min (ref >60)
GFR calc non Af Amer: >60 mL/min (ref >60)
Potassium: 3.7 mEq/L (ref 3.5–5.1)
Sodium: 138 mEq/L (ref 135–145)

## 2011-05-07 LAB — PROTIME-INR
INR: 1.08 (ref 0.00–1.49)
Prothrombin Time: 14.2 seconds (ref 11.6–15.2)

## 2011-05-07 LAB — APTT: aPTT: 39 seconds — ABNORMAL HIGH (ref 24–37)

## 2011-05-07 LAB — GLUCOSE, CAPILLARY: Glucose-Capillary: 96 mg/dL (ref 70–99)

## 2011-05-08 ENCOUNTER — Inpatient Hospital Stay (HOSPITAL_COMMUNITY): Payer: Medicare Other

## 2011-05-08 ENCOUNTER — Other Ambulatory Visit: Payer: Self-pay | Admitting: Cardiothoracic Surgery

## 2011-05-08 DIAGNOSIS — J9819 Other pulmonary collapse: Secondary | ICD-10-CM

## 2011-05-08 LAB — BASIC METABOLIC PANEL
BUN: 8 mg/dL (ref 6–23)
Chloride: 100 mEq/L (ref 96–112)
Creatinine, Ser: 0.78 mg/dL (ref 0.50–1.35)
GFR calc Af Amer: >60 mL/min (ref >60)
Glucose, Bld: 72 mg/dL (ref 70–99)
Potassium: 3.4 mEq/L — ABNORMAL LOW (ref 3.5–5.1)

## 2011-05-08 LAB — CBC
HCT: 35.4 % — ABNORMAL LOW (ref 39.0–52.0)
Hemoglobin: 11.6 g/dL — ABNORMAL LOW (ref 13.0–17.0)
MCV: 84.3 fL (ref 78.0–100.0)
RDW: 13.8 % (ref 11.5–15.5)
WBC: 10.3 10*3/uL (ref 4.0–10.5)

## 2011-05-08 LAB — CULTURE, RESPIRATORY W GRAM STAIN

## 2011-05-09 ENCOUNTER — Inpatient Hospital Stay (HOSPITAL_COMMUNITY): Payer: Medicare Other

## 2011-05-09 DIAGNOSIS — J690 Pneumonitis due to inhalation of food and vomit: Secondary | ICD-10-CM

## 2011-05-09 DIAGNOSIS — I214 Non-ST elevation (NSTEMI) myocardial infarction: Secondary | ICD-10-CM

## 2011-05-09 DIAGNOSIS — J449 Chronic obstructive pulmonary disease, unspecified: Secondary | ICD-10-CM

## 2011-05-09 DIAGNOSIS — E876 Hypokalemia: Secondary | ICD-10-CM

## 2011-05-09 DIAGNOSIS — J9819 Other pulmonary collapse: Secondary | ICD-10-CM

## 2011-05-09 LAB — CROSSMATCH
ABO/RH(D): AB POS
Antibody Screen: NEGATIVE
Unit division: 0
Unit division: 0

## 2011-05-09 LAB — CARDIAC PANEL(CRET KIN+CKTOT+MB+TROPI)
CK, MB: 4.2 ng/mL — ABNORMAL HIGH (ref 0.3–4.0)
Total CK: 369 U/L — ABNORMAL HIGH (ref 7–232)
Troponin I: <0.3 ng/mL (ref <0.30)

## 2011-05-09 LAB — CBC
HCT: 34.4 % — ABNORMAL LOW (ref 39.0–52.0)
Hemoglobin: 11.2 g/dL — ABNORMAL LOW (ref 13.0–17.0)
MCV: 84.5 fL (ref 78.0–100.0)
RBC: 4.07 MIL/uL — ABNORMAL LOW (ref 4.22–5.81)
RDW: 13.8 % (ref 11.5–15.5)
WBC: 8.5 10*3/uL (ref 4.0–10.5)

## 2011-05-09 LAB — GLUCOSE, CAPILLARY
Glucose-Capillary: 120 mg/dL — ABNORMAL HIGH (ref 70–99)
Glucose-Capillary: 169 mg/dL — ABNORMAL HIGH (ref 70–99)

## 2011-05-09 LAB — BASIC METABOLIC PANEL
Calcium: 8.8 mg/dL (ref 8.4–10.5)
GFR calc Af Amer: >60 mL/min (ref >60)
GFR calc non Af Amer: >60 mL/min (ref >60)
Potassium: 3.5 mEq/L (ref 3.5–5.1)
Sodium: 141 mEq/L (ref 135–145)

## 2011-05-10 DIAGNOSIS — J471 Bronchiectasis with (acute) exacerbation: Secondary | ICD-10-CM

## 2011-05-10 LAB — BASIC METABOLIC PANEL
BUN: 9 mg/dL (ref 6–23)
CO2: 32 mEq/L (ref 19–32)
Chloride: 99 mEq/L (ref 96–112)
Creatinine, Ser: 0.64 mg/dL (ref 0.50–1.35)
GFR calc Af Amer: >60 mL/min (ref >60)
Potassium: 3.3 mEq/L — ABNORMAL LOW (ref 3.5–5.1)

## 2011-05-10 LAB — GLUCOSE, CAPILLARY
Glucose-Capillary: 98 mg/dL (ref 70–99)
Glucose-Capillary: 92 mg/dL (ref 70–99)

## 2011-05-10 LAB — MAGNESIUM: Magnesium: 2.6 mg/dL — ABNORMAL HIGH (ref 1.5–2.5)

## 2011-05-10 NOTE — Consult Note (Signed)
NAMEBALDOMERO, MIRARCHI                 ACCOUNT NO.:  [id number]  MEDICAL RECORD NO.:  [id number]  LOCATION:  2110                         FACILITY:  [facility]  PHYSICIAN:  Salvatore Decent. Dorris Fetch, M.D.DATE OF BIRTH:  [birth year]  DATE OF CONSULTATION:  05/05/2011 DATE OF DISCHARGE:                                CONSULTATION   REASON FOR CONSULTATION:  Atelectasis, left lung.  Mr. Kader is a 77 year old gentleman who presented to the emergency room after a syncopal episode at home.  He had the episode after going to the bathroom.  He had some dried blood noted on his face.  He was brought to the emergency room by his wife.  He was found to have some mild external signs of trauma.  A CT of the head was performed which was essentially negative.  His cardiac enzymes were mildly elevated with a CK of 270, MB of 6.7, and troponin of 0.93.  Chest x-ray showed volume loss in the left lung, the right lung was hyperexpanded.  The patient has chronic shortness of breath and cough due to COPD and those have not been particularly worse.  He has not had any hemoptysis. He currently denies any breathing difficulty or lying down at rest.  PAST MEDICAL HISTORY:  Significant for COPD and hypertension.  CURRENT MEDICATIONS: 1. Albuterol MDIs. 2. Aspirin 325 mg daily. 3. Multivitamin one daily. 4. Oxygen at 4 liters nasal cannula.  He has no known drug allergies.  FAMILY HISTORY:  Unknown.  SOCIAL HISTORY:  History of heavy smoking, not currently smoking.  REVIEW OF SYSTEMS:  Positive for frequent cough which is productive, typically clear white sputum.  He has not had any hemoptysis.  No fevers, chills, or sweats.  He has had a 20-pound weight loss over the past 6 months.  All other systems are negative.  PHYSICAL EXAMINATION:  GENERAL:  Mr. Nardozzi is a thin, chronically ill appearing 77 year old gentleman, in no acute distress. VITAL SIGNS:  Blood pressure is 117/69, pulse 85, respirations are  18. He is afebrile on arrival.  He is on nasal cannula oxygen with 100% oxygen saturation. NEUROLOGIC:  He is awake.  He is alert, appropriate, and oriented x3. LUNGS:  Diminished breath sounds on the left side but they are present. There is no stridor. CARDIAC:  He has a regular rate and rhythm.  Normal S1 and S2.  No rubs, murmurs, or gallops.  LABORATORY DATA:  Head CT, chest x-ray, and cardiac enzymes as previously noted. Sodium is 140, potassium 4.8, BUN 11, creatinine 0.8.  Hematocrit 44, white count 14.5, platelets 251.  IMPRESSION:  Mr. Kumpf as a 77 year old gentleman who had a syncopal episode this morning.  It is unclear exactly what the etiology of that was.  During his workup, he was found to have volume loss on the left side in the setting of severe emphysema.  This most likely is due to centrally obstructing lung cancer.  RECOMMENDATIONS:  Admit to hospital service for completion of syncope workup, CT of the chest which has already been ordered.  He will likely need a bronchoscopy but the need for that will be determined after his CT scan  is reviewed.     Salvatore Decent Dorris Fetch, M.D.     SCH/MEDQ  D:  05/05/2011  T:  05/06/2011  Job:  [id number]  Electronically Signed by Charlett Lango M.D. on 05/10/2011 01:53:17 PM

## 2011-05-11 ENCOUNTER — Inpatient Hospital Stay (HOSPITAL_COMMUNITY): Payer: Medicare Other

## 2011-05-11 LAB — CBC
Hemoglobin: 12.3 g/dL — ABNORMAL LOW (ref 13.0–17.0)
MCH: 27.8 pg (ref 26.0–34.0)
MCHC: 32.7 g/dL (ref 30.0–36.0)
MCV: 85.1 fL (ref 78.0–100.0)
RBC: 4.42 MIL/uL (ref 4.22–5.81)

## 2011-05-11 LAB — BASIC METABOLIC PANEL
BUN: 12 mg/dL (ref 6–23)
CO2: 28 mEq/L (ref 19–32)
Calcium: 9.1 mg/dL (ref 8.4–10.5)
Creatinine, Ser: 0.62 mg/dL (ref 0.50–1.35)
GFR calc non Af Amer: >60 mL/min (ref >60)
Glucose, Bld: 100 mg/dL — ABNORMAL HIGH (ref 70–99)

## 2011-05-11 LAB — BODY FLUID CULTURE: Culture: NO GROWTH

## 2011-05-12 ENCOUNTER — Inpatient Hospital Stay (HOSPITAL_COMMUNITY): Payer: Medicare Other

## 2011-05-12 DIAGNOSIS — F05 Delirium due to known physiological condition: Secondary | ICD-10-CM

## 2011-05-12 DIAGNOSIS — R040 Epistaxis: Secondary | ICD-10-CM

## 2011-05-12 LAB — CBC
HCT: 36 % — ABNORMAL LOW (ref 39.0–52.0)
MCHC: 32.2 g/dL (ref 30.0–36.0)
Platelets: 311 10*3/uL (ref 150–400)
RDW: 13.9 % (ref 11.5–15.5)

## 2011-05-12 LAB — GLUCOSE, CAPILLARY
Glucose-Capillary: 99 mg/dL (ref 70–99)
Glucose-Capillary: 98 mg/dL (ref 70–99)

## 2011-05-12 LAB — BASIC METABOLIC PANEL
BUN: 14 mg/dL (ref 6–23)
GFR calc Af Amer: >60 mL/min (ref >60)
GFR calc non Af Amer: >60 mL/min (ref >60)
Potassium: 4 mEq/L (ref 3.5–5.1)

## 2011-05-13 ENCOUNTER — Inpatient Hospital Stay (HOSPITAL_COMMUNITY): Payer: Medicare Other

## 2011-05-13 DIAGNOSIS — I2510 Atherosclerotic heart disease of native coronary artery without angina pectoris: Secondary | ICD-10-CM

## 2011-05-13 LAB — GLUCOSE, CAPILLARY

## 2011-05-14 LAB — GLUCOSE, CAPILLARY: Glucose-Capillary: 90 mg/dL (ref 70–99)

## 2011-05-18 NOTE — Discharge Summary (Addendum)
[name], [name]                 ACCOUNT NO.:  [id number]  MEDICAL RECORD NO.:  [id number]  LOCATION:  6527                         FACILITY:  [facility]  PHYSICIAN:  Keith Dalton. Sherene Sires, MD, FCCPDATE OF BIRTH:  07[birth year]1934  DATE OF ADMISSION:  05/05/2011 DATE OF DISCHARGE:  05/14/2011                              DISCHARGE SUMMARY   DATE OF ADMISSION:  May 05, 2011.  DATE OF DISCHARGE:  Anticipated May 14, 2011.  DISCHARGE DIAGNOSES: 1. Syncopal episode. 2. Left lung collapse in the setting of aspiration pneumonia due to     epistaxis with underlying emphysema. 3. Type 2 non-ST elevation myocardial infarction, status post left     heart cath with diffuse nonobstructive coronary artery disease. 4. Delirium. 5. Hypertension.  LABORATORY DATA:  May 12, 2011, BMP demonstrates sodium 140, potassium 4.0, chloride 100, CO2 30, glucose 90, BUN 14, creatinine 0.67, calcium 9.5.  May 12, 2011, CBC demonstrates WBC 9.3, hemoglobin 11.6, hematocrit 36.0, platelet count 311.  May 05, 2011, cardiac enzymes demonstrate a CK total of 540, CK-MB 12.6, and troponin of 1.95.  Third set of enzymes demonstrates CK of 369, CK-MB 4.2 and troponin less than 0.30.  MICRO DATA:  May 05, 2011, MRSA PCR screening is negative.  May 06, 2011, respiratory culture demonstrates normal oropharyngeal flora.  May 08, 2011, bronchial washings demonstrate WBCs.  No organisms seen.  No growth on final results.  RADIOLOGIC DATA:  Initial chest x-ray demonstrates essential total collapse of the left lung with mediastinal shift.  May 05, 2011, CT of the head demonstrates a normal CT without evidence of acute intracranial abnormality.  CT of the cervical spine demonstrates no acute fracture dislocation.  May 05, 2011, CT of the chest demonstrates marked volume loss within the left pneumothorax with pleural fluid and parenchymal opacity involving the small amount of existing left lung.  There is marked cystic  bronchiectasis.  May 12, 2011, chest x-ray demonstrates no significant interval change, left hemithorax opacification, and volume loss with mediastinal shift.  HISTORY OF PRESENT ILLNESS:  [name] is a 77 year old white male with a past medical history of presumed COPD, history of pneumonia in 2009, ongoing tobacco use, hypertension and previous hernia repair, who presented to [facility] on May 05, 2011, with acute syncopal episode.  At 82 years of old, he still continues to work daily.  On the night prior to admission he went to bed as usual and in the middle of night he got up.  He remembers feeling dizzy and falling. After he fell he did remember trying to get up off the floor, but not being able to do so and then passed out and woke up on the floor.  His family did find him in the bathroom, but he does not have any recollection of any of the other events surrounding the incident.  His family transported him to the emergency department, and during evaluation, he was noted to have essential total collapse of the left lung and mediastinal shift on chest x-ray.  He also was noted during hospital course to have elevation of troponin and CK-MB consistent with type 2 non-ST-elevation MI.  Secondary to the left lung collapse, he did undergo bronchoscopy by Dr. Donata Clay which demonstrated thick mucus in the left main bronchus and with lower lobe examination of the left and right lung without evidence of endobronchial lesions.  Bronchial washings were sent for cytology and an endobronchial biopsy was completed which were negative.  Culture data to date are negative.  Post bronchoscopy, [name] did unfortunately have an episode of severe hospital delirium which complicated his medical care.  Post resolution of the hospital delirium, he did have cardiac catheterization to complete the evaluation of syncope which demonstrated a normal left ventricle with a left  ventricular ejection fraction of 60% and diffuse nonobstructive coronary disease.  [name] was treated for aspiration pneumonia during hospitalization and given that he had a nosebleed prior to admit and likely the blood drain into the left main bronchus assisting and plugging.  Syncopal episode was thought to be related to collapse of the left lung.  At time of discharge, [name] was evaluated for home care needs and is recommended at this time that he receive a home safety evaluation.  Prior to hospitalization, he did wear oxygen at night and with activity in his work shed.  It is recommended he continue with this as well.  Prior to hospitalization, he did not have a primary pulmonologist, he will be set at time of discharge for follow up with Dr. Shan Levans in the pulmonary office.  His previous home medications for hypertension were continued.  He also was added Lopressor during hospitalization.  Given likely severe underlying COPD, he was started on Advair and was given q.6 hour albuterol and Atrovent nebulization as needed for shortness of breath or wheezing at time of discharge.  HOSPITAL COURSE BY DISCHARGE DIAGNOSIS:1. Syncopal episode, this was thought likely secondary to a near total     collapse of the left lung secondary to bloody aspiration and mucous     plugging.  [name] does have a radiographic demonstration of     severe emphysema/chronic obstructive pulmonary disease.  A complete     evaluation of syncopal episode, [name] underwent evaluation of     cardiac enzymes which were positive for a type 2 non-ST-elevation     MI and a bronchoscopy as well as cardiac catheterization.  Cardiac     cath demonstrated nonobstructive coronary artery disease,     bronchoscopy washings and brushings for cytology and endobronchial     biopsy were negative.  Cultures during hospital course were also     negative.  No further syncope during hospital admit. 2. Left lung  collapse in the setting of aspiration of pneumonia     secondary to epistaxis with underlying severe emphysema.  Mr.     [name] did undergo a bronchoscopy which was negative during     hospital course.  Chest x-ray does continue to demonstrate no     significant interval change with left hemithorax opacification and     volume loss in mediastinal shift.  Given the radiographic evidence     of COPD and home O2 use, [name] was set up at time of discharge     for pulmonary follow up with Dr. Delford Field.  He also was started on     Advair as well as nebulized bronchodilators put in place for home     use. 3. Type 2 non-ST elevation MI status.  [name] did have a peak     troponin of  1.95 and a CK-MB of 12.6 with creatine kinase of 540.     He was evaluated by [facility] during hospitalization and     underwent cardiac catheterization by Dr. Tonny Bollman, which     demonstrated nonobstructive coronary artery disease.  He did have a     normal left ventricle with left ventricular ejection fraction of     60%. 4. Delirium postbronchoscopy, [name] did have a significant     episode of hospital delirium for which I did delay evaluation for     cardiac cath initially, however, with resolution delirium  he was     able to undergo cath later during hospital course.  At time of     discharge, acute delirium has resolved. 5. Hypertension.  [name] does have a known history of hypertension     and prior to hospitalization was on hydrochlorothiazide.  During     the hospitalization, metoprolol 12.5 mg b.i.d. was added per     Cardiology recommendations.  He will continue on this     postdischarge.  DISCHARGE INSTRUCTIONS: 1. Activity.  He has been instructed to increase activity and slowly     as tolerated. 2. Diet, low-sodium heart-healthy 3. Followup, he is scheduled to follow up with Dr. Shan Levans on     May 20, 2011, at 9:45 a.m.  DISCHARGE MEDICATIONS: 1. Advair  250/50 one puff inhaled twice daily. 2. Ipratropium 0.5 mg inhaled every 6 hours as needed for shortness of     breath or wheezing. 3. Metoprolol 12.5 mg by mouth twice daily. 4. Albuterol nebulization 2.5 mg inhaled every 6 hours as needed. 5. Aspirin enteric-coated 81 mg by mouth daily. 6. Hydrochlorothiazide 25 mg tabs half tablet by mouth daily. 7. Multivitamin 1 tablet by mouth daily.  DISPOSITION AT TIME OF DISCHARGE:  [name] has met maximum benefit of inpatient therapy and is currently medically stable and cleared for discharge anticipated on May 14, 2011.  Pending MD review.  TIME SPENT ON DISPOSITION:  Greater than 35 minutes.     Canary Brim, NP   ______________________________ Keith Dalton. Sherene Sires, MD, FCCP    BO/MEDQ  D:  05/13/2011  T:  05/14/2011  Job:  [id number]  cc:   Veverly Fells. Excell Seltzer, MD Verne Carrow, MD  Electronically Signed by Sandrea Hughs MD [facility] on 05/18/2011 12:46:18 AM Electronically Signed by Canary Brim  on 05/18/2011 09:18:04 AM

## 2011-05-19 ENCOUNTER — Encounter: Payer: Self-pay | Admitting: Critical Care Medicine

## 2011-05-19 NOTE — Op Note (Signed)
  [name], [name]                 ACCOUNT NO.:  [id number]  MEDICAL RECORD NO.:  [id number]  LOCATION:  2110                         FACILITY:  [facility]  PHYSICIAN:  Kerin Perna, M.D.  DATE OF BIRTH:  04-Apr-1932  DATE OF PROCEDURE:  05/08/2011 DATE OF DISCHARGE:                              OPERATIVE REPORT   OPERATION:  Video bronchoscopy with endobronchial biopsy.  POSTOPERATIVE DIAGNOSIS:  Atelectasis of left lung.  SURGEON:  Kerin Perna, MD  ANESTHESIA:  General.  INDICATION:  The patient is a 77 year old ex-smoker who presented to the hospital with syncopal episode and was found to have complete atelectasis of his left lung.  His most recent chest radiograph was 3 years ago when he had the CT scan of the chest which showed large bullous emphysema of the left lung, 7-mm nodule in left upper lobe and some hilar adenopathy measuring 1.8 cm.  Associated with this, he had some questionable hemoptysis during a syncopal episode prior to presentation.  A repeat CT scan showed patent proximal airway of the left mainstem bronchus, but complete atelectasis, otherwise no abnormal mediastinal adenopathy noted.  The patient was not intubated and the bronchoscopy was requested by the Pulmonary Service.  Prior to surgery, I discussed the indications, benefits, and risks of general anesthesia with video bronchoscopy of the patient's daughter and informed consent was obtained.  PROCEDURE IN DETAIL:  The patient was brought to the operating room, and placed supine on the operating room table where general anesthesia was induced.  Through the endotracheal tube, a video bronchoscope was passed.  The distal trachea and carina were normal.  The bronchoscope was passed on the right mainstem bronchus on the right upper lobe, middle lobe, and lower lobe.  Endobronchial segments were visualized and no endobronchial lesions were noted.  The left mainstem bronchus had thick mucus which was  cleared.  Careful examination of the left upper lobe and left lower lobe endobronchial segments showed no endobronchial lesions or bleeding.  Brushings and washings were sent for cytology and an endobronchial biopsy of the left upper lobe also was submitted. Washings were also sent for culture.  The bronchoscope was withdrawn and the lung was bagged vigorously to try to reinflate the lung and then the patient was extubated, returned to recovery room in stable condition.    Kerin Perna, M.D.    PV/MEDQ  D:  05/08/2011  T:  05/09/2011  Job:  [id number]  Electronically Signed by Kerin Perna M.D. on 05/19/2011 02:49:23 PM

## 2011-05-20 ENCOUNTER — Encounter: Payer: Self-pay | Admitting: Critical Care Medicine

## 2011-05-20 ENCOUNTER — Ambulatory Visit (INDEPENDENT_AMBULATORY_CARE_PROVIDER_SITE_OTHER): Payer: Medicare Other | Admitting: Critical Care Medicine

## 2011-05-20 ENCOUNTER — Ambulatory Visit (INDEPENDENT_AMBULATORY_CARE_PROVIDER_SITE_OTHER)
Admission: RE | Admit: 2011-05-20 | Discharge: 2011-05-20 | Disposition: A | Discharge status: Home / Self Care | Payer: Medicare Other | Source: Ambulatory Visit | Attending: Critical Care Medicine | Admitting: Critical Care Medicine

## 2011-05-20 DIAGNOSIS — J479 Bronchiectasis, uncomplicated: Secondary | ICD-10-CM

## 2011-05-20 DIAGNOSIS — F419 Anxiety disorder, unspecified: Secondary | ICD-10-CM

## 2011-05-20 DIAGNOSIS — I214 Non-ST elevation (NSTEMI) myocardial infarction: Secondary | ICD-10-CM

## 2011-05-20 DIAGNOSIS — I219 Acute myocardial infarction, unspecified: Secondary | ICD-10-CM

## 2011-05-20 DIAGNOSIS — J438 Other emphysema: Secondary | ICD-10-CM

## 2011-05-20 DIAGNOSIS — F411 Generalized anxiety disorder: Secondary | ICD-10-CM

## 2011-05-20 DIAGNOSIS — I10 Essential (primary) hypertension: Secondary | ICD-10-CM

## 2011-05-20 NOTE — Progress Notes (Addendum)
Subjective:    Patient ID: [name], male    DOB: Dec 04, 1931, 77 y.o.   MRN: [id number]  HPI  77 y.o.WM Hx Cylindrical bronchiectasis Remains anxious.  Hx of anxiety and progressively worse.  No cough .  Dyspnea is at baseline.  Stays on oxygen  Coughs up mucus occ,   No chest pain Not sleeping.  Pt in hosp 6/12 end to 7/12.  Pt had FOB in or,  No ca seen.  Minimal mucus removed in or. All c/s data neg Past Medical History  Diagnosis Date  . Syncopal episodes   . Collapse of left lung   . Pneumonia   . Emphysema   . Delirium   . Hypertension   . Non-ST elevation MI (NSTEMI)     type 2  . Bronchiectasis      History reviewed. No pertinent family history.   History   Social History  . Marital Status: Married    Spouse Name: N/A    Number of Children: N/A  . Years of Education: N/A   Occupational History  . Not on file.   Social History Main Topics  . Smoking status: Former Smoker -- 1.5 packs/day for 20 years    Types: Cigarettes, Cigars    Quit date: 11/07/1978  . Smokeless tobacco: Current User    Types: Snuff, Chew   Comment: occas smokeless tobacco use  . Alcohol Use: Not on file  . Drug Use: Not on file  . Sexually Active: Not on file   Other Topics Concern  . Not on file   Social History Narrative  . No narrative on file     No Known Allergies   Outpatient Prescriptions Prior to Visit  Medication Sig Dispense Refill  . aspirin 81 MG tablet Take 81 mg by mouth daily.        . hydrochlorothiazide 25 MG tablet Take 12.5 mg by mouth daily.        . metoprolol tartrate (LOPRESSOR) 12.5 mg TABS Take 12.5 mg by mouth 2 (two) times daily.        . Multiple Vitamin (MULTIVITAMIN) tablet Take 1 tablet by mouth daily.        . Fluticasone-Salmeterol (ADVAIR DISKUS) 250-50 MCG/DOSE AEPB Inhale 1 puff into the lungs every 12 (twelve) hours.           Review of Systems Constitutional:   No  weight loss, night sweats,  Fevers, chills, fatigue,  lassitude. HEENT:   No headaches,  Difficulty swallowing,  Tooth/dental problems,  Sore throat,                No sneezing, itching, ear ache, nasal congestion, post nasal drip,   CV:  No chest pain,  Orthopnea, PND, swelling in lower extremities, anasarca, dizziness, palpitations  GI  No heartburn, indigestion, abdominal pain, nausea, vomiting, diarrhea, change in bowel habits, loss of appetite  Resp: Notes  shortness of breath with exertion not  at rest.  No excess mucus, notes  productive cough,  No non-productive cough,  No coughing up of blood.  No change in color of mucus.  Notes  wheezing.  No chest wall deformity  Skin: no rash or lesions.  GU: no dysuria, change in color of urine, no urgency or frequency.  No flank pain.  MS:  No joint pain or swelling.  No decreased range of motion.  No back pain.  Psych:  notes unable to sleep, occ agitation controlled with xanax   Objective:  Physical Exam Filed Vitals:   05/20/11 0950  BP: 120/64  Pulse: 65  Temp: 97.6 F (36.4 C)  TempSrc: Oral  Height: 5\' 11"  (1.803 m)  Weight: 164 lb (74.39 kg)  SpO2: 94%    Gen: Pleasant, well-nourished, in no distress,  normal affect, not as agitated as pt was in hospital setting  ENT: No lesions,  mouth clear,  oropharynx clear, no postnasal drip  Neck: No JVD, no TMG, no carotid bruits  Lungs: No use of accessory muscles, no dullness to percussion, scattered rhonchi L lung, improved aeration L lung, exp wheeze on L  Cardiovascular: RRR, heart sounds normal, no murmur or gallops, no peripheral edema  Abdomen: soft and NT, no HSM,  BS normal  Musculoskeletal: No deformities, no cyanosis or clubbing  Neuro: alert, non focal, not delirius   Skin: Warm, no lesions or rashes      05/20/11 CXR Findings: Changes of cystic bronchiectasis with associated left  lung atelectasis is again identified as demonstrated on prior CT of  05/05/2011. There has been an increase in the aeration of  the  dilated bronchi especially at the left base.  There is unchanged mediastinal shift into the left hemithorax and  heart size is poorly assessed. Hyperexpansion of the right lung  field is seen with subsequent attenuation of parenchymal markings.  The right lung parenchyma appears clear with no focal infiltrate or  signs of congestive failure. No pleural fluid is seen on the  right. No pneumothorax is identified on either side.  Splenic granulomata are again seen. Bony structures demonstrate  degenerative osteophytosis of the lower thoracic spine and are  otherwise intact.  IMPRESSION:  Some interval increase in aeration of the dilated left lower lobe  bronchi. Clear right lung. No signs of residual pneumothorax noted.   Assessment & Plan:   Cylindrical bronchiectasis Cylindrical bronchiectasis with recent pneumonia 6/12 and L lung collapse Neg FOB for CA 7/12 Improved CXR 7/12 Note no desat with exertion on RA 05/20/11 Plan Cont BD nebs bid Oxygen qhs and prn daytime   Anxiety disorder Hx of anxiety and prior episodes of delirium Exacerbated by hypoxemia and recent PNA and ATX of L lung Pt on prn xanax Case discussed with NP of Dr Manus Gunning. This pt will need geriatric psych eval and f/u Plan Per PCP office to f/u on this issue      Updated Medication List Outpatient Encounter Prescriptions as of 05/20/2011  Medication Sig Dispense Refill  . ALPRAZolam (XANAX) 0.5 MG tablet Take 0.5 mg by mouth 2 (two) times daily.        Marland Kitchen aspirin 81 MG tablet Take 81 mg by mouth daily.        . hydrochlorothiazide 25 MG tablet Take 12.5 mg by mouth daily.        Marland Kitchen ipratropium-albuterol (DUONEB) 0.5-2.5 (3) MG/3ML SOLN Take 3 mLs by nebulization 2 (two) times daily.        . metoprolol tartrate (LOPRESSOR) 12.5 mg TABS Take 12.5 mg by mouth 2 (two) times daily.        . Multiple Vitamin (MULTIVITAMIN) tablet Take 1 tablet by mouth daily.        Marland Kitchen DISCONTD: Fluticasone-Salmeterol  (ADVAIR DISKUS) 250-50 MCG/DOSE AEPB Inhale 1 puff into the lungs every 12 (twelve) hours.

## 2011-05-20 NOTE — Progress Notes (Signed)
Quick Note:  Called, spoke with pt's wife. She was informed cxr has improved and pt needs to stay of on nebulizer bid per PW. She verbalized understanding of this and stated she would inform pt of both. ______

## 2011-05-20 NOTE — Patient Instructions (Addendum)
Dr Randel Books office will contact you regarding medication for nerves and sleep Keep your 05/30/11 appt with Ehinger A chest xray will be obtained today Stay on nebulizer twice a day Use oxygen at night and as needed during the day Return 2 months

## 2011-05-21 ENCOUNTER — Encounter: Payer: Self-pay | Admitting: Critical Care Medicine

## 2011-05-21 DIAGNOSIS — F419 Anxiety disorder, unspecified: Secondary | ICD-10-CM | POA: Insufficient documentation

## 2011-05-21 DIAGNOSIS — I10 Essential (primary) hypertension: Secondary | ICD-10-CM | POA: Insufficient documentation

## 2011-05-21 DIAGNOSIS — I214 Non-ST elevation (NSTEMI) myocardial infarction: Secondary | ICD-10-CM | POA: Insufficient documentation

## 2011-05-21 DIAGNOSIS — J479 Bronchiectasis, uncomplicated: Secondary | ICD-10-CM | POA: Insufficient documentation

## 2011-05-21 NOTE — Assessment & Plan Note (Signed)
Cylindrical bronchiectasis with recent pneumonia 6/12 and L lung collapse Neg FOB for CA 7/12 Improved CXR 7/12 Note no desat with exertion on RA 05/20/11 Plan Cont BD nebs bid Oxygen qhs and prn daytime

## 2011-05-21 NOTE — Assessment & Plan Note (Signed)
Hx of anxiety and prior episodes of delirium Exacerbated by hypoxemia and recent PNA and ATX of L lung Pt on prn xanax Case discussed with NP of Dr Manus Gunning. This pt will need geriatric psych eval and f/u Plan Per PCP office to f/u on this issue

## 2011-05-26 NOTE — Cardiovascular Report (Signed)
  [name], [name]                 ACCOUNT NO.:  [id number]  MEDICAL RECORD NO.:  [id number]  LOCATION:  6527                         FACILITY:  [facility]  PHYSICIAN:  Veverly Fells. Excell Seltzer, MD  DATE OF BIRTH:  04/03/1932  DATE OF PROCEDURE:  05/13/2011 DATE OF DISCHARGE:                           CARDIAC CATHETERIZATION   PROCEDURE: 1. Left heart catheterization. 2. Selective coronary angiography. 3. Left ventricular angiography.  PROCEDURAL INDICATIONS:  [name] is a 77 year old gentleman who presented after a syncopal event.  He was found to have complete atelectasis of his left long and he has a history of large bullous emphysema.  During all of these, he had elevated cardiac enzymes in the setting of syncope and elevated cardiac markers, he was referred for cardiac cath.  Risks and indications of procedure were reviewed with the patient.  The right wrist was prepped, draped, and anesthetized with 1% lidocaine. Using modified Seldinger technique, a 5-French sheath was placed in the right radial artery.  A 3 mg of verapamil was administered through the sheath, 4000 units of unfractionated heparin was given intravenously.  A TIG 4.0, 5-French catheter was used to select the right coronary artery. It did not engage the left coronary artery.  The JL 3.5-cm guide catheter was used for the left coronary artery.  A pigtail was used for the ventriculogram.  The patient tolerated the procedure well.  There were no immediate complications.  TR band was used for radial hemostasis.  PROCEDURAL FINDINGS:  Aortic pressure 157/63 with mean of 98.  Left ventricular pressure is 154/17.  Right coronary artery:  The RCA is dominant.  It is moderately calcified with diffuse nonobstructive disease.  Proximal and mid vessel have 25- 30% stenoses.  The distal vessel was widely patent before it divides into the PDA and posterolateral branches, both of which are patent.  Left main:  The left main  has diffuse mild narrowing of about 30%.  The proximal and distal shaft of the left main both have mild narrowing and mild calcification.  There does not appear to be any hemodynamically significant left main stenosis.  LAD:  The LAD has very heavy calcification in the proximal vessel. There is 30-40% proximal LAD stenosis involving the origin of the first diagonal which is also heavily calcified and has 50% ostial stenosis. The remaining portions of the LAD just have mild nonobstructive plaque with no significant stenoses.  Left circumflex:  The left circumflex gives off a large first OM branch without significant stenosis.  The AV groove circumflex has no significant stenosis.  Left ventriculography shows normal LV function.  The ejection fraction is estimated at 60%.  FINAL ASSESSMENT: 1. Diffuse nonobstructive coronary artery disease as above. 2. Normal left ventricular systolic function.  This patient should respond well to medical therapy.  He does not have any high-grade coronary stenosis and does not require revascularization. Veverly Fells. Excell Seltzer, MD     MDC/MEDQ  D:  05/13/2011  T:  05/13/2011  Job:  [id number]  cc:   Bryan Lemma. Manus Gunning, M.D.  Electronically Signed by Tonny Bollman MD on 05/26/2011 12:40:06 AM

## 2011-06-27 DIAGNOSIS — Z9981 Dependence on supplemental oxygen: Secondary | ICD-10-CM

## 2011-06-27 DIAGNOSIS — J449 Chronic obstructive pulmonary disease, unspecified: Secondary | ICD-10-CM

## 2011-06-27 DIAGNOSIS — I509 Heart failure, unspecified: Secondary | ICD-10-CM

## 2011-06-27 DIAGNOSIS — I10 Essential (primary) hypertension: Secondary | ICD-10-CM

## 2011-07-05 NOTE — Consult Note (Signed)
[name], [name]                 ACCOUNT NO.:  [id number]  MEDICAL RECORD NO.:  [id number]  LOCATION:  2110                         FACILITY:  [facility]  PHYSICIAN:  Bevelyn Buckles. Bensimhon, MDDATE OF BIRTH:  [birth year]  DATE OF CONSULTATION:  05/05/2011 DATE OF DISCHARGE:                                CONSULTATION   PRIMARY CARE PHYSICIAN:  Bryan Lemma. Ehinger, MD  PRIMARY CARDIOLOGIST:  None.  CHIEF COMPLAINT:  Syncope.  HISTORY OF PRESENT ILLNESS:  Mr. [name] is a 77 year old male with no previous history of cardiac issues.  He was in his usual state of health yesterday.  At 81, he still works daily.  He went to bed last night as usual.  Sometime in the middle of the night, he got up, probably to go to the bathroom.  He remembers feeling dizzy and falling.  After he fell, he remembers trying to get up off the floor, but not being able to do this.  He then passed out and woke up on the floor.  His family found him in the bathroom.  He does not remember anything else about the incident.  His family brought him to the hospital and as part of his evaluation, he had a chest x-ray, which showed essential total collapse of the left lung with a mediastinal shift.  Pulmonary/critical care is involved in managing this and Cardiology was asked to consult on him because of the syncope.  Mr. [name] has never passed out before.  He never gets chest pain.  He never gets palpitations.  Currently in the emergency room, he is resting comfortably, but does have dyspnea on exertion.  PAST MEDICAL HISTORY: 1. COPD. 2. History of pneumonia in 2009. 3. Ongoing tobacco use. 4. Borderline hypertension.  SURGICAL HISTORY:  Hernia repair.  ALLERGIES:  No known drug allergies.  CURRENT MEDICATIONS: 1. Albuterol nebulizers. 2. Hydrochlorothiazide 25 mg 1/2 tablet daily. 3. Multivitamin daily. 4. Aspirin 81 mg a day.  SOCIAL HISTORY:  [place]. He has  family nearby.  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  He still works daily.  He has a greater than 100 pack year history of tobacco use, but denies alcohol or drug abuse.  FAMILY HISTORY:  His mother was in her 35s when she died with no coronary artery disease, and his father died of 73 with an MI.  He has six siblings and none of them have cardiac issues.  REVIEW OF SYSTEMS:  He coughs and wheezes occasionally.  The cough has not been productive.  He never gets palpitations.  The syncope is described above.  He has not had chest pain or shortness of breath at rest.  He has some chronic dyspnea on exertion that has not changed recently.  He has occasional arthralgias and joint pains.  He denies GI symptoms.  Full 14-point review of systems is otherwise negative except as stated in the HPI.  PHYSICAL EXAMINATION:  VITAL SIGNS:  Temperature is 94.5, blood pressure 127/64, heart rate 83, respiratory rate 20, O2 saturation 96% on room air.  GENERAL:  He is a well-developed  elderly, slender white male in no acute distress at rest. HEENT:  Normal with the exception of poor dentition. NECK:  There is no lymphadenopathy, thyromegaly, bruit, or JVD noted. CV:  His heart is regular in rate and rhythm with an S1 and S2, and no significant murmur or gallop is noted.  Distal pulses are intact in all four extremities. LUNGS:  He has significantly decreased breath sounds on the left with rales and some rhonchi.  There are also rales and decreased breath sounds on the right, but not to the same extent. SKIN:  He has some areas of abrasion on his right arm. ABDOMEN:  Soft and nontender with active bowel sounds. EXTREMITIES:  There is no cyanosis or edema noted. MUSCULOSKELETAL:  There is no joint deformity or effusions. NEURO:  He is alert and oriented with cranial nerves II through XII grossly intact.  CT of the chest showed marked volume loss within the left  hemithorax with pleural fluid and parenchymal opacity involving the small amount of existing left lung.  There is marked cystic bronchiectasis.  He has a right to left midline shift and elevation of the left hemidiaphragm. The right lung is hyperexpanded but clear.  There is mild cylindrical bronchiectasis in the right lower lobe and right middle lobe of the lung.  There is no evidence of rib fractures or pneumothorax.  There is no adenopathy.  However, the left hilar region is obscured by the adjacent collapsed lung.  CT of the head shows no acute abnormality, and CT of the spine showed no evidence of fracture or dislocation.  Two-view chest x-ray showed essential total collapse of the left lung with mediastinal shift.  EKG, sinus rhythm, rate 77 with no acute ischemic changes.  LABORATORY VALUES:  Hemoglobin 13.6, hematocrit 41.3, WBCs 14.5, platelets 251 with a left shift.  INR 1.04.  Sodium 140, potassium 4.8, chloride 100, BUN 11, creatinine 0.8, glucose 122, CK-MB 270/6.7 with an index of 2.5, and troponin I 0.93.  IMPRESSION:  Mr. [name] was seen today by Dr. Gala Romney, the patient evaluated and the data reviewed.  We suspect his syncope is likely secondary to right heart strain in the setting of left lung collapse. Now with a type 2 non-ST elevation myocardial infarction .  He denies chest pain or angina at baseline.  He still has hemoptysis.  We will check an echocardiogram.  Given the lack of angina and hemoptysis, we would favor dobutamine Myoview prior to discharge over catheter to exclude high-risk coronary artery disease.  He will not have heparin secondary to his hemoptysis.  No beta- blocker is indicated because of his chronic obstructive pulmonary disease.  He should be continued on aspirin 81 mg daily.  We will follow his rhythm closely as well.     Theodore Demark, PA-C   ______________________________ Bevelyn Buckles. Bensimhon, MD    RB/MEDQ  D:  05/05/2011   T:  05/06/2011  Job:  [id number]  Electronically Signed by Theodore Demark PA-C on 05/24/2011 05:08:57 PM Electronically Signed by Arvilla Meres MD on 07/05/2011 05:12:42 PM

## 2011-07-22 ENCOUNTER — Ambulatory Visit: Payer: Medicare Other | Admitting: Critical Care Medicine

## 2011-08-02 LAB — CBC
Hemoglobin: 12.8 — ABNORMAL LOW
Hemoglobin: 13.8
Hemoglobin: 14.1
MCHC: 33.2
MCHC: 35
MCV: 84.9
MCV: 85.8
MCV: 86.5
Platelets: 297
RBC: 4.44
RBC: 4.66
RBC: 4.74
RDW: 12.9
WBC: 12.8 — ABNORMAL HIGH
WBC: 13.3 — ABNORMAL HIGH
WBC: 13.5 — ABNORMAL HIGH
WBC: 8.8

## 2011-08-02 LAB — DIFFERENTIAL
Basophils Relative: 0
Eosinophils Absolute: 0
Eosinophils Relative: 2
Lymphocytes Relative: 9 — ABNORMAL LOW
Lymphs Abs: 0.7
Lymphs Abs: 1.2
Monocytes Absolute: 0.5
Monocytes Absolute: 0.7
Monocytes Relative: 4
Monocytes Relative: 5
Neutro Abs: 11.1 — ABNORMAL HIGH
Neutrophils Relative %: 91 — ABNORMAL HIGH

## 2011-08-02 LAB — URINALYSIS, ROUTINE W REFLEX MICROSCOPIC
Bilirubin Urine: NEGATIVE
Hgb urine dipstick: NEGATIVE
Ketones, ur: NEGATIVE
Nitrite: NEGATIVE
Protein, ur: NEGATIVE
Urobilinogen, UA: 0.2

## 2011-08-02 LAB — BASIC METABOLIC PANEL
BUN: 12
CO2: 31
Calcium: 8.7
GFR calc non Af Amer: >60
Glucose, Bld: 116 — ABNORMAL HIGH
Potassium: 4
Sodium: 134 — ABNORMAL LOW

## 2011-08-02 LAB — CULTURE, BLOOD (ROUTINE X 2)
Culture: NO GROWTH
Culture: NO GROWTH

## 2011-08-02 LAB — COMPREHENSIVE METABOLIC PANEL
ALT: 26
AST: 39 — ABNORMAL HIGH
Albumin: 3.5
Alkaline Phosphatase: 74
BUN: 11
BUN: 9
CO2: 28
Calcium: 9.5
Chloride: 98
Creatinine, Ser: 0.74
GFR calc Af Amer: >60
GFR calc non Af Amer: >60
Glucose, Bld: 152 — ABNORMAL HIGH
Potassium: 4.4
Sodium: 133 — ABNORMAL LOW
Total Bilirubin: 0.3
Total Bilirubin: 0.5
Total Protein: 7

## 2011-08-02 LAB — TROPONIN I: Troponin I: 0.03

## 2011-08-02 LAB — PHOSPHORUS: Phosphorus: 4.4

## 2011-08-02 LAB — CARDIAC PANEL(CRET KIN+CKTOT+MB+TROPI)
CK, MB: 3.7
Relative Index: 2.3
Total CK: 162

## 2011-08-02 LAB — CK TOTAL AND CKMB (NOT AT ARMC): Relative Index: 1.2

## 2011-08-02 LAB — MAGNESIUM: Magnesium: 2.5

## 2011-09-07 ENCOUNTER — Other Ambulatory Visit: Payer: Self-pay | Admitting: Internal Medicine

## 2011-09-08 DIAGNOSIS — F419 Anxiety disorder, unspecified: Secondary | ICD-10-CM

## 2011-09-08 HISTORY — DX: Anxiety disorder, unspecified: F41.9

## 2011-09-28 ENCOUNTER — Emergency Department (HOSPITAL_COMMUNITY): Payer: Medicare Other

## 2011-09-28 ENCOUNTER — Encounter (HOSPITAL_COMMUNITY): Payer: Self-pay | Admitting: Emergency Medicine

## 2011-09-28 ENCOUNTER — Inpatient Hospital Stay (HOSPITAL_COMMUNITY)
Admission: EM | Admit: 2011-09-28 | Discharge: 2011-10-05 | DRG: 552 | Disposition: A | Discharge status: Rehabilitation Facility | Payer: Medicare Other | Attending: General Surgery | Admitting: General Surgery

## 2011-09-28 ENCOUNTER — Other Ambulatory Visit: Payer: Self-pay

## 2011-09-28 DIAGNOSIS — S12600A Unspecified displaced fracture of seventh cervical vertebra, initial encounter for closed fracture: Principal | ICD-10-CM | POA: Diagnosis present

## 2011-09-28 DIAGNOSIS — J449 Chronic obstructive pulmonary disease, unspecified: Secondary | ICD-10-CM

## 2011-09-28 DIAGNOSIS — S060X0A Concussion without loss of consciousness, initial encounter: Secondary | ICD-10-CM | POA: Diagnosis present

## 2011-09-28 DIAGNOSIS — I252 Old myocardial infarction: Secondary | ICD-10-CM

## 2011-09-28 DIAGNOSIS — S129XXA Fracture of neck, unspecified, initial encounter: Secondary | ICD-10-CM

## 2011-09-28 DIAGNOSIS — Y998 Other external cause status: Secondary | ICD-10-CM

## 2011-09-28 DIAGNOSIS — S22009A Unspecified fracture of unspecified thoracic vertebra, initial encounter for closed fracture: Secondary | ICD-10-CM | POA: Diagnosis present

## 2011-09-28 DIAGNOSIS — Z87891 Personal history of nicotine dependence: Secondary | ICD-10-CM

## 2011-09-28 DIAGNOSIS — K5900 Constipation, unspecified: Secondary | ICD-10-CM | POA: Diagnosis present

## 2011-09-28 DIAGNOSIS — S0990XA Unspecified injury of head, initial encounter: Secondary | ICD-10-CM

## 2011-09-28 DIAGNOSIS — S060X9A Concussion with loss of consciousness of unspecified duration, initial encounter: Secondary | ICD-10-CM

## 2011-09-28 DIAGNOSIS — F0390 Unspecified dementia without behavioral disturbance: Secondary | ICD-10-CM | POA: Diagnosis present

## 2011-09-28 DIAGNOSIS — F411 Generalized anxiety disorder: Secondary | ICD-10-CM | POA: Diagnosis present

## 2011-09-28 DIAGNOSIS — S12601A Unspecified nondisplaced fracture of seventh cervical vertebra, initial encounter for closed fracture: Secondary | ICD-10-CM | POA: Diagnosis present

## 2011-09-28 DIAGNOSIS — J4489 Other specified chronic obstructive pulmonary disease: Secondary | ICD-10-CM | POA: Diagnosis present

## 2011-09-28 DIAGNOSIS — I10 Essential (primary) hypertension: Secondary | ICD-10-CM | POA: Diagnosis present

## 2011-09-28 DIAGNOSIS — S22008A Other fracture of unspecified thoracic vertebra, initial encounter for closed fracture: Secondary | ICD-10-CM | POA: Diagnosis present

## 2011-09-28 HISTORY — DX: Chronic obstructive pulmonary disease, unspecified: J44.9

## 2011-09-28 LAB — POCT I-STAT, CHEM 8
Glucose, Bld: 114 mg/dL — ABNORMAL HIGH (ref 70–99)
HCT: 43 % (ref 39.0–52.0)
Hemoglobin: 14.6 g/dL (ref 13.0–17.0)
Potassium: 3.5 mEq/L (ref 3.5–5.1)
Sodium: 136 mEq/L (ref 135–145)

## 2011-09-28 LAB — BASIC METABOLIC PANEL
CO2: 27 mEq/L (ref 19–32)
Chloride: 98 mEq/L (ref 96–112)
GFR calc Af Amer: >90 mL/min (ref >90)
Potassium: 3.3 mEq/L — ABNORMAL LOW (ref 3.5–5.1)
Sodium: 137 mEq/L (ref 135–145)

## 2011-09-28 LAB — PROTIME-INR
INR: 0.92 (ref 0.00–1.49)
Prothrombin Time: 12.6 seconds (ref 11.6–15.2)

## 2011-09-28 LAB — TROPONIN I: Troponin I: <0.3 ng/mL (ref <0.30)

## 2011-09-28 LAB — CBC
HCT: 41.3 % (ref 39.0–52.0)
Hemoglobin: 14.3 g/dL (ref 13.0–17.0)
RBC: 4.79 MIL/uL (ref 4.22–5.81)

## 2011-09-28 MED ORDER — ONDANSETRON HCL 4 MG/2ML IJ SOLN: 4.0000 mg | Freq: Four times a day (QID) | INTRAMUSCULAR | Status: DC | PRN

## 2011-09-28 MED ORDER — PANTOPRAZOLE SODIUM 40 MG IV SOLR
40.0000 mg | Freq: Every day | INTRAVENOUS | Status: DC
  Administered 2011-09-29: 40 mg via INTRAVENOUS
  Filled 2011-09-28 (×3): qty 40

## 2011-09-28 MED ORDER — ONDANSETRON HCL 4 MG PO TABS: 4.0000 mg | ORAL_TABLET | Freq: Four times a day (QID) | ORAL | Status: DC | PRN

## 2011-09-28 MED ORDER — DOCUSATE SODIUM 100 MG PO CAPS
100.0000 mg | ORAL_CAPSULE | Freq: Two times a day (BID) | ORAL | Status: DC
  Administered 2011-09-28 – 2011-10-05 (×11): 100 mg via ORAL
  Filled 2011-09-28 (×16): qty 1

## 2011-09-28 MED ORDER — IOHEXOL 300 MG/ML  SOLN
100.0000 mL | Freq: Once | INTRAMUSCULAR | Status: AC | PRN
  Administered 2011-09-28: 100 mL via INTRAVENOUS

## 2011-09-28 MED ORDER — FENTANYL CITRATE 0.05 MG/ML IJ SOLN
50.0000 ug | Freq: Once | INTRAMUSCULAR | Status: AC
  Administered 2011-09-28: 50 ug via INTRAVENOUS
  Filled 2011-09-28: qty 2

## 2011-09-28 MED ORDER — PANTOPRAZOLE SODIUM 40 MG PO TBEC
40.0000 mg | DELAYED_RELEASE_TABLET | Freq: Every day | ORAL | Status: DC
  Administered 2011-09-28 – 2011-10-05 (×6): 40 mg via ORAL
  Filled 2011-09-28 (×7): qty 1

## 2011-09-28 MED ORDER — OXYCODONE HCL 5 MG PO TABS
5.0000 mg | ORAL_TABLET | ORAL | Status: DC | PRN
  Administered 2011-09-28 – 2011-09-30 (×6): 5 mg via ORAL
  Filled 2011-09-28 (×6): qty 1

## 2011-09-28 MED ORDER — ALPRAZOLAM 0.5 MG PO TABS
0.5000 mg | ORAL_TABLET | Freq: Two times a day (BID) | ORAL | Status: DC | PRN
  Administered 2011-09-28 – 2011-09-29 (×2): 0.5 mg via ORAL
  Filled 2011-09-28 (×2): qty 1

## 2011-09-28 MED ORDER — ONDANSETRON HCL 8 MG PO TABS
4.0000 mg | ORAL_TABLET | Freq: Four times a day (QID) | ORAL | Status: DC | PRN
  Filled 2011-09-28: qty 0.5

## 2011-09-28 MED ORDER — BISACODYL 10 MG RE SUPP: 10.0000 mg | Freq: Every day | RECTAL | Status: DC | PRN

## 2011-09-28 MED ORDER — SODIUM CHLORIDE 0.9 % IV SOLN
INTRAVENOUS | Status: DC
  Administered 2011-09-28: 18:00:00 via INTRAVENOUS
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
## 2011-09-28 NOTE — ED Notes (Signed)
Dr Wyatt at bedside.  

## 2011-09-28 NOTE — ED Notes (Signed)
Pt to CT via stretcher.  Family remains at bedside.  Informed by daughter of pt that patient becomes severely disoriented at night.  She stated the last hospital stay of her father, he required restraints and she did not want him restrained.  She requested he be given medication instead.  This nurse asked daughter if there was any family who could sit with him during hospital stay;  She replied there was not any family members who could sit with him at this time.

## 2011-09-28 NOTE — ED Notes (Signed)
Attempted to get urine from pt. Pt stated he did not have to go.

## 2011-09-28 NOTE — ED Notes (Signed)
Report given to Clydie Braun, RN on 5000.  Informed Clydie Braun that sign and held orders have already been released by physician; informed that protonix has not been verified by pharmacy yet and has not been given due to this.  Updated patient on plan of care; informed patient that a bed is ready, report has been called, and he is going to be transported to 5038.  Patient has no other questions or concerns at this time.  RN at bedside preparing patient for transport and placing patient on Zoll monitor.  Will continue to monitor.

## 2011-09-28 NOTE — ED Notes (Signed)
Admitting MD now at bedside talking with family and patient.  Hematoma noted to the left temporal/parietal area.  Right shoulder contusion noted.  Patient currently denies pain.  Will continue to monitor.

## 2011-09-28 NOTE — ED Notes (Signed)
Received report from [place], Charity fundraiser.  Patient moved to exam room 06.  Currently sitting up in bed; no respiratory or acute distress noted.  Family present at bedside.  Patient has no questions or concerns at this time.  Dr. Lindie Spruce at bedside.  Will continue to monitor.

## 2011-09-28 NOTE — ED Notes (Signed)
Patient given urinal and informed that we need a urine specimen.
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
## 2011-09-28 NOTE — ED Notes (Signed)
Calling report now. 
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
## 2011-09-28 NOTE — ED Notes (Signed)
Informed by patient's family that pt with chronic collapsed lung on right side.  Pt requested to be put on O2.  O2 via [place] placed at 2l/MIN.

## 2011-09-28 NOTE — ED Notes (Signed)
Patient being transported on portable cardiac monitor with RN.

## 2011-09-28 NOTE — ED Notes (Signed)
Called to Radiology by tech.  Stated pt was c/o of not being able to breath and was becoming cyanotic.  This nurse went to radiology.  Pt was not Cyanotic;  breathing was normal.

## 2011-09-28 NOTE — ED Notes (Signed)
Pt to hallway from radiology.  NAD noted.  Pain relieved with medication.

## 2011-09-29 DIAGNOSIS — S060X0A Concussion without loss of consciousness, initial encounter: Secondary | ICD-10-CM | POA: Diagnosis present

## 2011-09-29 DIAGNOSIS — S12601A Unspecified nondisplaced fracture of seventh cervical vertebra, initial encounter for closed fracture: Secondary | ICD-10-CM | POA: Diagnosis present

## 2011-09-29 LAB — CBC
Hemoglobin: 13 g/dL (ref 13.0–17.0)
MCHC: 32.6 g/dL (ref 30.0–36.0)
WBC: 11 10*3/uL — ABNORMAL HIGH (ref 4.0–10.5)

## 2011-09-29 MED ORDER — ALBUTEROL SULFATE (5 MG/ML) 0.5% IN NEBU
INHALATION_SOLUTION | RESPIRATORY_TRACT | Status: AC
  Administered 2011-09-29: 2.5 mg via RESPIRATORY_TRACT
  Filled 2011-09-29: qty 0.5

## 2011-09-29 MED ORDER — IPRATROPIUM-ALBUTEROL 18-103 MCG/ACT IN AERO
2.0000 | INHALATION_SPRAY | Freq: Four times a day (QID) | RESPIRATORY_TRACT | Status: DC
  Administered 2011-09-29 – 2011-10-05 (×17): 2 via RESPIRATORY_TRACT
  Filled 2011-09-29: qty 14.7

## 2011-09-29 MED ORDER — ZOLPIDEM TARTRATE 5 MG PO TABS
5.0000 mg | ORAL_TABLET | Freq: Once | ORAL | Status: AC
Start: 2011-09-29 — End: 2011-09-29
  Administered 2011-09-29: 5 mg via ORAL
  Filled 2011-09-29: qty 1

## 2011-09-29 MED ORDER — IPRATROPIUM BROMIDE 0.02 % IN SOLN
RESPIRATORY_TRACT | Status: AC
  Administered 2011-09-29: 11:00:00
  Filled 2011-09-29: qty 2.5

## 2011-09-29 MED ORDER — ALPRAZOLAM 0.5 MG PO TABS
0.5000 mg | ORAL_TABLET | Freq: Four times a day (QID) | ORAL | Status: DC | PRN
  Administered 2011-09-29 – 2011-10-05 (×14): 0.5 mg via ORAL
  Filled 2011-09-29 (×15): qty 1

## 2011-09-29 MED ORDER — ALBUTEROL SULFATE (5 MG/ML) 0.5% IN NEBU
2.5000 mg | INHALATION_SOLUTION | RESPIRATORY_TRACT | Status: DC | PRN
  Administered 2011-09-29 – 2011-10-02 (×3): 2.5 mg via RESPIRATORY_TRACT
  Filled 2011-09-29 (×2): qty 0.5
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
## 2011-09-30 DIAGNOSIS — S22008A Other fracture of unspecified thoracic vertebra, initial encounter for closed fracture: Secondary | ICD-10-CM | POA: Diagnosis present

## 2011-09-30 MED ORDER — OXYCODONE HCL 5 MG PO TABS
5.0000 mg | ORAL_TABLET | ORAL | Status: DC | PRN
  Administered 2011-09-30 – 2011-10-02 (×6): 10 mg via ORAL
  Filled 2011-09-30 (×6): qty 2

## 2011-09-30 MED ORDER — HALOPERIDOL LACTATE 5 MG/ML IJ SOLN
5.0000 mg | Freq: Once | INTRAMUSCULAR | Status: AC
  Administered 2011-09-30: 5 mg via INTRAVENOUS
  Filled 2011-09-30: qty 1

## 2011-09-30 MED ORDER — ZOLPIDEM TARTRATE 10 MG PO TABS
10.0000 mg | ORAL_TABLET | Freq: Once | ORAL | Status: AC
  Administered 2011-09-30: 10 mg via ORAL
  Filled 2011-09-30: qty 1

## 2011-09-30 NOTE — Plan of Care (Signed)
Problem: Phase II Progression Outcomes Goal: Progress activity as tolerated unless otherwise ordered Outcome: Not Progressing Pt not following instructions.  He does not understand simple commands to walk to right or back.  He needs to be reminded of which leg to move, when to sit down.

## 2011-09-30 NOTE — Progress Notes (Signed)
Pt has been attempting to get out of bed multiple times between 1830-2030.  When he got up, he was gait was very unsteady.  Pt was adamant that his gait was fine and for staff to leave him alone.  Pt refused to answer questions to assess his orientation.  His response was I'm tired of people asking me the same questions over and over.  He added that he wanted to go to the porch to get some fresh air.  MD called, Dr. Donell Beers, orders received.
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
## 2011-09-30 NOTE — Progress Notes (Signed)
Agree with above.   Was out of bed in chair and had walked in hall before I saw pt. PT/OT

## 2011-10-01 LAB — URINALYSIS, ROUTINE W REFLEX MICROSCOPIC
Glucose, UA: NEGATIVE mg/dL
Ketones, ur: NEGATIVE mg/dL
Leukocytes, UA: NEGATIVE
Nitrite: NEGATIVE
Specific Gravity, Urine: 1.017 (ref 1.005–1.030)
pH: 6 (ref 5.0–8.0)

## 2011-10-01 LAB — URINE MICROSCOPIC-ADD ON

## 2011-10-01 MED ORDER — MORPHINE SULFATE 4 MG/ML IJ SOLN
4.0000 mg | INTRAMUSCULAR | Status: DC | PRN
  Administered 2011-10-01 (×3): 4 mg via INTRAVENOUS
  Filled 2011-10-01 (×3): qty 1

## 2011-10-01 MED ORDER — HALOPERIDOL LACTATE 5 MG/ML IJ SOLN
2.0000 mg | Freq: Once | INTRAMUSCULAR | Status: AC
  Administered 2011-10-01: 2 mg via INTRAVENOUS
  Filled 2011-10-01: qty 1

## 2011-10-01 NOTE — Progress Notes (Signed)
Covering Clinical Child psychotherapist (CSW) attempted to complete SBIRT however pt. Was being seen by PT. CSW will return at a later time. Theresia Bough, MSW, Theresia Majors [phone]

## 2011-10-01 NOTE — Progress Notes (Signed)
Physical Therapy Treatment Patient Details Name: [name] MRN: [id number] DOB: 1931/11/22 Today's Date: 10/01/2011  PT Assessment/Plan  PT - Assessment/Plan Comments on Treatment Session: Pt. processing with more difficulty today. Wife reports pt. had bad night with little sleep and increased anxiety.   PT Plan: Discharge plan remains appropriate PT Frequency: Min 6X/week Follow Up Recommendations: Inpatient Rehab Equipment Recommended: Defer to next venue PT Goals  Acute Rehab PT Goals PT Transfer Goal: Sit to Stand/Stand to Sit - Progress: Progressing toward goal PT Goal: Ambulate - Progress: Progressing toward goal  PT Treatment Precautions/Restrictions  Precautions Precautions: Fall Restrictions Weight Bearing Restrictions: No Mobility (including Balance) Bed Mobility Bed Mobility: No Transfers Sit to Stand: 4: Min assist Sit to Stand Details (indicate cue type and reason): persistent cues for hand placement and to complete task Stand to Sit: 4: Min assist Stand to Sit Details: persistent cues to initiate sitting and for hand placement Ambulation/Gait Ambulation/Gait: Yes Ambulation/Gait Assistance: 3: Mod assist Ambulation/Gait Assistance Details (indicate cue type and reason): mod assist for advancing RW, constant cues to continue forward with ambulation Ambulation Distance (Feet): 25 Feet Assistive device: Rolling walker Gait Pattern: Decreased stride length;Trunk flexed Gait velocity: occasionally takes hands off RW , needs max safety cues Stairs: No  Dynamic Standing Balance Dynamic Standing - Level of Assistance: 4: Min assist (legs shaking and unsteady.  Pt. reports weakness.) Exercise    End of Session PT - End of Session Equipment Utilized During Treatment: Gait belt Activity Tolerance: Patient limited by fatigue (limited by weakness and cognitive status) Nurse Communication: Mobility status for transfers;Mobility status for ambulation (informed RN Olu  of decrease in pt. status since  11/23) General Behavior During Session: Lethargic Cognition: Impaired Cognitive Impairment: poor attention, needs max cueing for tasks, continues to keep eyes closed most of session  Ferman Hamming 10/01/2011, 10:49 AM Acute Rehabilitation Services [phone] (pager)

## 2011-10-01 NOTE — Progress Notes (Signed)
OT Cancellation Note Treatment cancelled today due to OT unable to rouse patient from sleep.   Patient just received IV morphine and sleeping heavily.  RN reports patient had a rough night and has not slept at all.  OT to check back for OT tx as time allows.  Ebony Hail, OT Pager [phone] Office [phone]

## 2011-10-01 NOTE — Progress Notes (Signed)
Clinical Social Worker could not complete assessment as pt kept dosing off and sitter reported pt had some confusion. Theresia Bough, MSW, Theresia [facility]ors [phone]

## 2011-10-01 NOTE — Progress Notes (Signed)
Subjective: This is a late entry/saw the patient on the afternoon of the 23rd and he was doing very well sitting in a chair with no complaints of headaches or neck pain.2  Objective: Vital signs in last 24 hours: Temp:  [97.5 F (36.4 C)] 97.5 F (36.4 C) (11/24 1610) Pulse Rate:  [88-92] 92  (11/24 0652) Resp:  [20] 20  (11/24 0652) BP: (148-153)/(77-79) 148/79 mmHg (11/24 0652) SpO2:  [96 %] 96 % (11/24 0652) FiO2 (%):  [96 %] 96 % (11/23 1520)  Intake/Output from previous day: 11/23 0701 - 11/24 0700 In: -  Out: 225 [Urine:225] Intake/Output this shift:    Neurologically intact without of 5 strength in his upper and lower extremities Notes pronator drift  Lab Results:  [facility] 09/29/11 0530 09/28/11 1537 09/28/11 1446  WBC 11.0* -- 10.4  HGB 13.0 14.6 --  HCT 39.9 43.0 --  PLT 227 -- 217   BMET  Basename 09/28/11 1537 09/28/11 1446  NA 136 137  K 3.5 3.3*  CL 100 98  CO2 -- 27  GLUCOSE 114* 117*  BUN 14 13  CREATININE 0.90 0.79  CALCIUM -- 9.3    Studies/Results: No results found.  Assessment/Plan: Continue mobilization with physical and occupational therapy. No new recommendations. Should the patient be discharged he'll need to be followed up with Dr. Jeral Fruit in approximately one to 2 weeks with an x-ray.  LOS: 3 days     Cervando Durnin P 10/01/2011, 7:27 AM
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
## 2011-10-02 MED ORDER — THERA M PLUS PO TABS
1.0000 | ORAL_TABLET | Freq: Every day | ORAL | Status: DC
  Administered 2011-10-03 – 2011-10-05 (×3): 1 via ORAL
  Filled 2011-10-02 (×3): qty 1

## 2011-10-02 MED ORDER — FOLIC ACID 1 MG PO TABS
1.0000 mg | ORAL_TABLET | Freq: Every day | ORAL | Status: DC
  Administered 2011-10-03 – 2011-10-05 (×3): 1 mg via ORAL
  Filled 2011-10-02 (×3): qty 1

## 2011-10-02 MED ORDER — BISACODYL 10 MG RE SUPP
10.0000 mg | Freq: Every day | RECTAL | Status: DC
  Administered 2011-10-02 – 2011-10-03 (×2): 10 mg via RECTAL
  Filled 2011-10-02 (×2): qty 1

## 2011-10-02 MED ORDER — LORAZEPAM 1 MG PO TABS
1.0000 mg | ORAL_TABLET | Freq: Four times a day (QID) | ORAL | Status: DC | PRN
  Administered 2011-10-04: 1 mg via ORAL
  Filled 2011-10-02: qty 1

## 2011-10-02 MED ORDER — ZOLPIDEM TARTRATE 10 MG PO TABS
10.0000 mg | ORAL_TABLET | Freq: Once | ORAL | Status: AC
  Administered 2011-10-02: 10 mg via ORAL
  Filled 2011-10-02: qty 1

## 2011-10-02 MED ORDER — LORAZEPAM 2 MG/ML IJ SOLN
INTRAMUSCULAR | Status: AC
  Filled 2011-10-02: qty 1

## 2011-10-02 MED ORDER — LORAZEPAM 2 MG/ML IJ SOLN
2.0000 mg | Freq: Once | INTRAMUSCULAR | Status: AC
  Administered 2011-10-03: 2 mg via INTRAVENOUS
  Filled 2011-10-02: qty 1

## 2011-10-02 MED ORDER — HALOPERIDOL LACTATE 5 MG/ML IJ SOLN
5.0000 mg | Freq: Three times a day (TID) | INTRAMUSCULAR | Status: DC
  Administered 2011-10-02 – 2011-10-05 (×6): 5 mg via INTRAVENOUS
  Filled 2011-10-02 (×9): qty 1

## 2011-10-02 MED ORDER — VITAMIN B-1 100 MG PO TABS
100.0000 mg | ORAL_TABLET | Freq: Every day | ORAL | Status: DC
  Administered 2011-10-03 – 2011-10-05 (×3): 100 mg via ORAL
  Filled 2011-10-02 (×3): qty 1

## 2011-10-02 MED ORDER — NICOTINE 21 MG/24HR TD PT24
21.0000 mg | MEDICATED_PATCH | Freq: Every day | TRANSDERMAL | Status: DC
  Administered 2011-10-03 – 2011-10-05 (×4): 21 mg via TRANSDERMAL
  Filled 2011-10-02 (×4): qty 1

## 2011-10-02 MED ORDER — THIAMINE HCL 100 MG/ML IJ SOLN
100.0000 mg | Freq: Every day | INTRAMUSCULAR | Status: DC
  Filled 2011-10-02 (×3): qty 1

## 2011-10-02 MED ORDER — POLYETHYLENE GLYCOL 3350 17 G PO PACK
17.0000 g | PACK | Freq: Every day | ORAL | Status: DC
  Administered 2011-10-02 – 2011-10-05 (×4): 17 g via ORAL
  Filled 2011-10-02 (×4): qty 1

## 2011-10-02 MED ORDER — LORAZEPAM 2 MG/ML IJ SOLN
1.0000 mg | Freq: Four times a day (QID) | INTRAMUSCULAR | Status: DC | PRN
  Filled 2011-10-02: qty 1

## 2011-10-02 MED ORDER — ALPRAZOLAM 0.5 MG PO TABS
1.0000 mg | ORAL_TABLET | Freq: Once | ORAL | Status: AC
  Administered 2011-10-02: 1 mg via ORAL
  Filled 2011-10-02: qty 2

## 2011-10-02 MED ORDER — HALOPERIDOL LACTATE 5 MG/ML IJ SOLN
INTRAMUSCULAR | Status: AC
  Administered 2011-10-02: 5 mg via INTRAVENOUS
  Filled 2011-10-02: qty 1

## 2011-10-02 NOTE — Progress Notes (Signed)
CSW met with pt, pt's wife, and pt's dtr RE: SBIRT completion and d/c planning. Consult for inpt rehab noted. Pt lives with his wife and his dtr and son live close by and are able to assist as needed. Pt's wife and dtr report if pt not candidate for inpt rehab, they plan to take him home and will not consider SNF. Pt's dtr, Inetta Fermo, can be reached at [phone]. No other CSW needs reported or noted. CSW signing off.  Dellie Burns, MSW, [place] [phone]

## 2011-10-02 NOTE — Progress Notes (Signed)
Pt still unsteady and uncooperative with direction.   Continues to loosen collar. Sitter, wife in room redirecting and pt continues to be uncooperative.  Agree with PA note.
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
## 2011-10-03 ENCOUNTER — Other Ambulatory Visit: Payer: Self-pay

## 2011-10-03 ENCOUNTER — Inpatient Hospital Stay (HOSPITAL_COMMUNITY): Payer: Medicare Other

## 2011-10-03 DIAGNOSIS — F0390 Unspecified dementia without behavioral disturbance: Secondary | ICD-10-CM | POA: Diagnosis present

## 2011-10-03 MED ORDER — ENOXAPARIN SODIUM 40 MG/0.4ML ~~LOC~~ SOLN
40.0000 mg | Freq: Every day | SUBCUTANEOUS | Status: DC
Start: 2011-10-03 — End: 2011-10-05
  Administered 2011-10-03 – 2011-10-05 (×3): 40 mg via SUBCUTANEOUS
  Filled 2011-10-03 (×4): qty 0.4

## 2011-10-03 MED ORDER — ACETAMINOPHEN 325 MG PO TABS
650.0000 mg | ORAL_TABLET | Freq: Four times a day (QID) | ORAL | Status: DC | PRN
  Administered 2011-10-03 – 2011-10-04 (×3): 650 mg via ORAL
  Filled 2011-10-03 (×3): qty 2

## 2011-10-03 MED ORDER — LORAZEPAM 2 MG/ML IJ SOLN
2.0000 mg | Freq: Once | INTRAMUSCULAR | Status: AC
  Administered 2011-10-03: 2 mg via INTRAVENOUS

## 2011-10-03 NOTE — Progress Notes (Signed)
SLP Cancellation Note  Unable to complete SLP evaluation/treatment secondary to patient lethargic/difficult to arouse. RN aware. Will f/u 10/04/11.    Therapist:  Ferdinand Lango MA, CCC-SLP [phone]

## 2011-10-03 NOTE — Progress Notes (Signed)
OT Cancellation Note  Treatment cancelled today due to patient receiving xray with multiple sedating medications. Pt currently unable to participate with therapy. OT to re attempt 10/04/11.   Harrel Carina Kingsley   OTR/L Pager: [phone] Office: [phone] .

## 2011-10-03 NOTE — Progress Notes (Signed)
I spoke with family.  They hope for CIR then to take patient home. F/U CT head no acute finding but motion limited study Patient examined and I agree with the assessment and plan  [name] E
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
## 2011-10-04 DIAGNOSIS — S069XAA Unspecified intracranial injury with loss of consciousness status unknown, initial encounter: Secondary | ICD-10-CM

## 2011-10-04 DIAGNOSIS — S12600A Unspecified displaced fracture of seventh cervical vertebra, initial encounter for closed fracture: Secondary | ICD-10-CM

## 2011-10-04 DIAGNOSIS — S069X9A Unspecified intracranial injury with loss of consciousness of unspecified duration, initial encounter: Secondary | ICD-10-CM

## 2011-10-04 DIAGNOSIS — S22009A Unspecified fracture of unspecified thoracic vertebra, initial encounter for closed fracture: Secondary | ICD-10-CM
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
## 2011-10-04 NOTE — Progress Notes (Signed)
Physical Therapy Treatment Patient Details Name: Kenric Ginger MRN: [id number] DOB: 20-Jul-1932 Today's Date: 10/04/2011  PT Assessment/Plan  PT - Assessment/Plan Comments on Treatment Session: Pt's mentation is improving, along with mobility.  Remains great CIR candidate. PT Plan: Discharge plan remains appropriate PT Frequency: Min 6X/week Follow Up Recommendations: Inpatient Rehab Equipment Recommended: Defer to next venue PT Goals  Acute Rehab PT Goals PT Goal: Supine/Side to Sit - Progress: Progressing toward goal PT Transfer Goal: Sit to Stand/Stand to Sit - Progress: Progressing toward goal PT Goal: Ambulate - Progress: Progressing toward goal  PT Treatment Precautions/Restrictions  Precautions Precautions: Fall Restrictions Weight Bearing Restrictions: No Other Position/Activity Restrictions: no weight bearing restricitons noted Mobility (including Balance) Bed Mobility Bed Mobility: Yes Rolling Right: 4: Min assist Rolling Right Details (indicate cue type and reason): cues for rolling technique and hand placement Right Sidelying to Sit: 3: Mod assist Right Sidelying to Sit Details (indicate cue type and reason): cues for UE use to rise to sit at EOB Transfers Transfers: Yes Sit to Stand: 3: Mod assist Sit to Stand Details (indicate cue type and reason): cues for hand placement, safe technique Stand to Sit: 4: Min assist Stand to Sit Details: cues for safe technique to approach chair and for hand placement Ambulation/Gait Ambulation/Gait Assistance: 4: Min assist (second person to manage equipment) Ambulation/Gait Assistance Details (indicate cue type and reason): min assist for safe use of RW, steady assist for balance Ambulation Distance (Feet): 100 Feet Assistive device: Rolling walker Stairs: No  Balance Balance Assessed: Yes Static Standing Balance Static Standing - Level of Assistance: 5: Stand by assistance (for safety at sink for brushing teeth) Dynamic  Standing Balance Dynamic Standing - Level of Assistance: 5: Stand by assistance Exercise    End of Session PT - End of Session Equipment Utilized During Treatment: Gait belt Activity Tolerance: Patient tolerated treatment well Patient left: in chair;with call bell in reach;with family/visitor present (wife present) General Behavior During Session: [facility] for tasks performed (able to keep eyes open today most of session) Cognition: Impaired Cognitive Impairment: able to follow diredtions more consistently today; persistent memory issues  Ferman Hamming 10/04/2011, 10:43 AM

## 2011-10-04 NOTE — Progress Notes (Signed)
Speech Language/Pathology Clinical/Bedside Swallow Evaluation Patient Details  Name: [name] MRN: [id number] DOB: [birth year] Today's Date: 10/04/2011  Past Medical History:  Past Medical History  Diagnosis Date  . Syncopal episodes   . Collapse of left lung   . Pneumonia   . Emphysema   . Delirium   . Hypertension   . Non-ST elevation MI (NSTEMI)     type 2  . Bronchiectasis   . COPD (chronic obstructive pulmonary disease)    Past Surgical History:  Past Surgical History  Procedure Date  . Cardiac catheterization     Assessment/Recommendations/Treatment Plan    SLP Assessment Clinical Impression Statement: Patient presents with a primary oral phase dysphagia characterized by suspected decreased left sided facial strength combined with AMS (secondary to TBI) resulting in left sided buccal pocketing and delayed oral transit of bolus. Despite these deficits however, patient appears to be protecting the airway at this time. No overt s/s of aspiration noted.  Risk for Aspiration: Mild Other Related Risk Factors: Cognitive impairment  Recommendations Solid Consistency: Regular Liquid Consistency: Thin Liquid Administration via: Cup;Straw Medication Administration: Whole meds with liquid Supervision: Intermittent supervision to cue for compensatory strategies Compensations: Slow rate;Small sips/bites;Check for pocketing Postural Changes and/or Swallow Maneuvers: Seated upright 90 degrees Oral Care Recommendations: Oral care BID  Prognosis Barriers to Reach Goals: Cognitive deficits  Individuals Consulted Consulted and Agree with Results and Recommendations: Family member/caregiver;Patient Family Member Consulted: wife  Swallow Study Goals  SLP Swallowing Goals Patient will consume recommended diet without observed clinical signs of aspiration with: Minimal assistance Swallow Study Goal #1 - Progress: Progressing toward goal Patient will utilize recommended  strategies during swallow to increase swallowing safety with: Minimal assistance Swallow Study Goal #2 - Progress: Progressing toward goal    General  Date of Onset: 09/28/11 Type of Study: Bedside swallow evaluation Diet Prior to this Study: Regular;Thin liquids Temperature Spikes Noted: No Respiratory Status: Room air Behavior/Cognition: Cooperative;Pleasant mood;Lethargic (decreased sustained attention) Oral Cavity - Dentition: Adequate natural dentition Vision: Functional for self-feeding Patient Positioning: Upright in chair Baseline Vocal Quality: Normal Volitional Cough: Strong Volitional Swallow: Able to elicit Ice chips: Not tested  Oral Motor/Sensory Function  Labial ROM: Within Functional Limits Labial Symmetry: Within Functional Limits Labial Strength: Within Functional Limits Labial Sensation: Within Functional Limits Lingual ROM: Within Functional Limits Lingual Symmetry: Within Functional Limits Lingual Strength: Within Functional Limits Lingual Sensation: Within Functional Limits Facial ROM: Reduced left Facial Symmetry: Within Functional Limits Facial Strength: Reduced (? midly reduced on left) Facial Sensation: Within Functional Limits Velum: Within Functional Limits Mandible: Impaired (mildy decreased ROM secondary to cervical collar)  Consistency Results  Ice Chips Ice chips: Not tested  Thin Liquid Thin Liquid: Within functional limits Presentation: Straw  Nectar Thick Liquid Nectar Thick Liquid: Not tested  Honey Thick Liquid Honey Thick Liquid: Not tested  Puree Puree: Not tested  Solid Solid: Impaired Presentation: Self Fed Oral Phase Impairments: Impaired anterior to posterior transit;Poor awareness of bolus Oral Phase Functional Implications: Oral residue (left sided buccal pocketing)  Dontavious [name] MA, CCC-SLP [phone]  Chelsee Hosie Meryl 10/04/2011,12:59 PM

## 2011-10-04 NOTE — Progress Notes (Signed)
Occupational Therapy Evaluation Patient Details Name: [name] MRN: [id number] DOB: 07-03-1932 Today's Date: 10/04/2011  OT Assessment/Plan OT Assessment/Plan Comments on Treatment Session: pt fatigued falling asleep in chair. Pt presents with TBI symptoms OT Plan: Discharge plan remains appropriate OT Frequency: Min 2X/week Follow Up Recommendations: Inpatient Rehab Equipment Recommended: Defer to next venue OT Goals Acute Rehab OT Goals OT Goal Formulation: With patient Time For Goal Achievement: 2 weeks ADL Goals Pt Will Perform Grooming: with modified independence;Standing at sink;Unsupported ADL Goal: Grooming - Progress: Partly met Pt Will Perform Upper Body Bathing: with modified independence;Sitting, edge of bed;Unsupported ADL Goal: Upper Body Bathing - Progress: Not addressed Pt Will Perform Lower Body Bathing: with modified independence;Sit to stand from bed;Unsupported ADL Goal: Lower Body Bathing - Progress: Not addressed Pt Will Perform Upper Body Dressing: with modified independence;Sitting, chair;Unsupported ADL Goal: Upper Body Dressing - Progress: Progressing toward goals Pt Will Perform Lower Body Dressing: with modified independence;Sit to stand from chair;Unsupported ADL Goal: Lower Body Dressing - Progress: Progressing toward goals Pt Will Transfer to Toilet: with modified independence;Ambulation;with DME;3-in-1 ADL Goal: Toilet Transfer - Progress: Not addressed  OT Treatment Precautions/Restrictions  Precautions Precautions: Fall Required Braces or Orthoses: Yes Cervical Brace: Hard collar Restrictions Weight Bearing Restrictions: No Other Position/Activity Restrictions: no weight bearing restricitons noted   ADL ADL Grooming: Performed;Shaving;Wash/dry hands;Wash/dry face;Teeth care;Minimal assistance (pt attempted to apply tooth paste to face to shave) Grooming Details (indicate cue type and reason): Max A to prevent tooth paste on cheek, pt  poor recall of sequence, Pt poor hyigene due to attention Where Assessed - Grooming: Sitting, chair;Supported Product manager: Not assessed Lower Body Bathing: Not assessed Upper Body Dressing: Performed;Minimal assistance Upper Body Dressing Details (indicate cue type and reason): Don gown as robe Where Assessed - Upper Body Dressing: Supported;Sitting, bed Lower Body Dressing: Performed;Set up Lower Body Dressing Details (indicate cue type and reason): don LLE supine, Don RLE at EOB (pt sit<>supine Supervision to the R side) Where Assessed - Lower Body Dressing: Sitting, bed Equipment Used: Rolling walker ADL Comments: Pt demonstrates with cognitive deficits, decreased balance, and decreased acitvity tolerance which have decreased pt's level of independence with ADLs and functional transfers.  Mobility  Bed Mobility Bed Mobility: Yes Rolling Right: 4: Min assist Rolling Right Details (indicate cue type and reason): cues for rolling technique and hand placement Right Sidelying to Sit: 3: Mod assist;HOB elevated (comment degrees) (25) Right Sidelying to Sit Details (indicate cue type and reason): cues for UE use to rise to sit at EOB Transfers Transfers: Yes Sit to Stand: 3: Mod assist;From bed;With upper extremity assist Sit to Stand Details (indicate cue type and reason): cue for hand placement safe technique Stand to Sit: 4: Min assist Stand to Sit Details: cues for safe technique to approach chair and for hand placement Exercises    End of Session OT - End of Session Equipment Utilized During Treatment: Gait belt;Cervical collar Activity Tolerance: Patient limited by fatigue Patient left: in chair;with call bell in reach;with family/visitor present Nurse Communication: Mobility status for transfers;Mobility status for ambulation (sitting in room) General Behavior During Session: [name] for tasks performed Cognition: Impaired Cognitive Impairment: able to follow diredtions  more consistently today; persistent memory issues  Lucile Shutters  10/04/2011, 11:12 AM Pager: [phone]

## 2011-10-04 NOTE — Progress Notes (Signed)
Patient was seen up ambulating with PT Plan for CIR Patient examined and I agree with the assessment and plan  [name] E
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
## 2011-10-04 NOTE — Plan of Care (Signed)
Problem: Phase I Progression Outcomes Goal: Other Phase I Outcomes/Goals Outcome: Completed/Met Date Met:  10/04/11 Swallow and cognitive evaluations complete with SLP.

## 2011-10-04 NOTE — Progress Notes (Signed)
Less confused. Up in Chair for 45 minutes. Did not complain when returned to bed. Restraints and sitter still being used

## 2011-10-05 ENCOUNTER — Inpatient Hospital Stay (HOSPITAL_COMMUNITY)
Admission: RE | Admit: 2011-10-05 | Discharge: 2011-10-21 | DRG: 946 | Disposition: A | Discharge status: Home Health | Payer: Medicare Other | Source: Ambulatory Visit | Attending: Physical Medicine & Rehabilitation | Admitting: Physical Medicine & Rehabilitation

## 2011-10-05 DIAGNOSIS — S069X9A Unspecified intracranial injury with loss of consciousness of unspecified duration, initial encounter: Secondary | ICD-10-CM

## 2011-10-05 DIAGNOSIS — S060X0A Concussion without loss of consciousness, initial encounter: Secondary | ICD-10-CM

## 2011-10-05 DIAGNOSIS — Z7982 Long term (current) use of aspirin: Secondary | ICD-10-CM

## 2011-10-05 DIAGNOSIS — F0390 Unspecified dementia without behavioral disturbance: Secondary | ICD-10-CM

## 2011-10-05 DIAGNOSIS — Z5189 Encounter for other specified aftercare: Secondary | ICD-10-CM

## 2011-10-05 DIAGNOSIS — F17200 Nicotine dependence, unspecified, uncomplicated: Secondary | ICD-10-CM

## 2011-10-05 DIAGNOSIS — S22009A Unspecified fracture of unspecified thoracic vertebra, initial encounter for closed fracture: Secondary | ICD-10-CM

## 2011-10-05 DIAGNOSIS — F411 Generalized anxiety disorder: Secondary | ICD-10-CM

## 2011-10-05 DIAGNOSIS — Y92410 Unspecified street and highway as the place of occurrence of the external cause: Secondary | ICD-10-CM

## 2011-10-05 DIAGNOSIS — J479 Bronchiectasis, uncomplicated: Secondary | ICD-10-CM

## 2011-10-05 DIAGNOSIS — Z79899 Other long term (current) drug therapy: Secondary | ICD-10-CM

## 2011-10-05 DIAGNOSIS — K5900 Constipation, unspecified: Secondary | ICD-10-CM

## 2011-10-05 DIAGNOSIS — S12600A Unspecified displaced fracture of seventh cervical vertebra, initial encounter for closed fracture: Secondary | ICD-10-CM

## 2011-10-05 DIAGNOSIS — G4700 Insomnia, unspecified: Secondary | ICD-10-CM

## 2011-10-05 DIAGNOSIS — I10 Essential (primary) hypertension: Secondary | ICD-10-CM

## 2011-10-05 DIAGNOSIS — I252 Old myocardial infarction: Secondary | ICD-10-CM

## 2011-10-05 MED ORDER — POLYETHYLENE GLYCOL 3350 17 G PO PACK
17.0000 g | PACK | Freq: Every day | ORAL | Status: DC | PRN
  Filled 2011-10-05: qty 1

## 2011-10-05 MED ORDER — ALBUTEROL SULFATE (5 MG/ML) 0.5% IN NEBU: 2.5000 mg | INHALATION_SOLUTION | Freq: Two times a day (BID) | RESPIRATORY_TRACT | Status: DC

## 2011-10-05 MED ORDER — METOPROLOL TARTRATE 25 MG PO TABS
25.0000 mg | ORAL_TABLET | Freq: Every day | ORAL | Status: DC
  Filled 2011-10-05: qty 1

## 2011-10-05 MED ORDER — METOPROLOL TARTRATE 25 MG/10 ML ORAL SUSPENSION
25.0000 mg | Freq: Every day | ORAL | Status: DC
  Administered 2011-10-05 – 2011-10-20 (×16): 25 mg via ORAL
  Filled 2011-10-05 (×17): qty 10

## 2011-10-05 MED ORDER — THERA M PLUS PO TABS
1.0000 | ORAL_TABLET | Freq: Every day | ORAL | Status: DC
  Filled 2011-10-05: qty 1

## 2011-10-05 MED ORDER — NICOTINE 21 MG/24HR TD PT24
21.0000 mg | MEDICATED_PATCH | Freq: Every day | TRANSDERMAL | Status: DC
  Administered 2011-10-05 – 2011-10-21 (×17): 21 mg via TRANSDERMAL
  Filled 2011-10-05 (×20): qty 1

## 2011-10-05 MED ORDER — PANTOPRAZOLE SODIUM 40 MG PO TBEC
40.0000 mg | DELAYED_RELEASE_TABLET | Freq: Every day | ORAL | Status: DC
  Administered 2011-10-05 – 2011-10-20 (×16): 40 mg via ORAL
  Filled 2011-10-05 (×14): qty 1

## 2011-10-05 MED ORDER — ENOXAPARIN SODIUM 40 MG/0.4ML ~~LOC~~ SOLN
40.0000 mg | Freq: Every day | SUBCUTANEOUS | Status: DC
  Administered 2011-10-06 – 2011-10-21 (×16): 40 mg via SUBCUTANEOUS
  Filled 2011-10-05 (×17): qty 0.4

## 2011-10-05 MED ORDER — HYDROCHLOROTHIAZIDE 12.5 MG PO CAPS
12.5000 mg | ORAL_CAPSULE | Freq: Every day | ORAL | Status: DC
  Administered 2011-10-05 – 2011-10-21 (×17): 12.5 mg via ORAL
  Filled 2011-10-05 (×18): qty 1

## 2011-10-05 MED ORDER — GUAIFENESIN-DM 100-10 MG/5ML PO SYRP
5.0000 mL | ORAL_SOLUTION | Freq: Four times a day (QID) | ORAL | Status: DC | PRN
  Administered 2011-10-13: 10 mL via ORAL
  Filled 2011-10-05: qty 10

## 2011-10-05 MED ORDER — ACETAMINOPHEN 325 MG PO TABS: 650.0000 mg | ORAL_TABLET | Freq: Four times a day (QID) | ORAL | Status: DC | PRN

## 2011-10-05 MED ORDER — PROMETHAZINE HCL 12.5 MG RE SUPP: 12.5000 mg | Freq: Four times a day (QID) | RECTAL | Status: DC | PRN

## 2011-10-05 MED ORDER — SORBITOL 70 % SOLN: 30.0000 mL | Freq: Two times a day (BID) | Status: DC | PRN

## 2011-10-05 MED ORDER — IPRATROPIUM-ALBUTEROL 0.5-2.5 (3) MG/3ML IN SOLN: 3.0000 mL | Freq: Two times a day (BID) | RESPIRATORY_TRACT | Status: DC

## 2011-10-05 MED ORDER — SENNOSIDES-DOCUSATE SODIUM 8.6-50 MG PO TABS
2.0000 | ORAL_TABLET | Freq: Every day | ORAL | Status: DC
  Administered 2011-10-05 – 2011-10-20 (×16): 2 via ORAL
  Filled 2011-10-05 (×16): qty 2

## 2011-10-05 MED ORDER — HYDROCHLOROTHIAZIDE 12.5 MG PO CAPS
12.5000 mg | ORAL_CAPSULE | Freq: Every day | ORAL | Status: DC
  Administered 2011-10-05: 12.5 mg via ORAL
  Filled 2011-10-05: qty 1

## 2011-10-05 MED ORDER — ALPRAZOLAM 0.5 MG PO TABS
0.5000 mg | ORAL_TABLET | Freq: Every day | ORAL | Status: DC
  Administered 2011-10-05: 0.5 mg via ORAL
  Filled 2011-10-05: qty 1

## 2011-10-05 MED ORDER — IPRATROPIUM BROMIDE 0.02 % IN SOLN: 0.5000 mg | Freq: Two times a day (BID) | RESPIRATORY_TRACT | Status: DC

## 2011-10-05 MED ORDER — FOLIC ACID 1 MG PO TABS
1.0000 mg | ORAL_TABLET | Freq: Every day | ORAL | Status: DC
  Administered 2011-10-05 – 2011-10-21 (×17): 1 mg via ORAL
  Filled 2011-10-05 (×18): qty 1

## 2011-10-05 MED ORDER — ONE-DAILY MULTI VITAMINS PO TABS: 1.0000 | ORAL_TABLET | Freq: Every day | ORAL | Status: DC

## 2011-10-05 MED ORDER — THERA M PLUS PO TABS
1.0000 | ORAL_TABLET | Freq: Every day | ORAL | Status: DC
  Administered 2011-10-05 – 2011-10-21 (×17): 1 via ORAL
  Filled 2011-10-05 (×18): qty 1

## 2011-10-05 MED ORDER — ENOXAPARIN SODIUM 40 MG/0.4ML ~~LOC~~ SOLN
40.0000 mg | Freq: Every day | SUBCUTANEOUS | Status: DC
  Filled 2011-10-05 (×2): qty 0.4

## 2011-10-05 MED ORDER — ASPIRIN 81 MG PO CHEW
81.0000 mg | CHEWABLE_TABLET | Freq: Every day | ORAL | Status: DC
  Administered 2011-10-06 – 2011-10-21 (×16): 81 mg via ORAL
  Filled 2011-10-05 (×15): qty 1

## 2011-10-05 MED ORDER — PROMETHAZINE HCL 12.5 MG PO TABS: 12.5000 mg | ORAL_TABLET | Freq: Four times a day (QID) | ORAL | Status: DC | PRN

## 2011-10-05 MED ORDER — PROMETHAZINE HCL 25 MG/ML IJ SOLN: 12.5000 mg | Freq: Four times a day (QID) | INTRAMUSCULAR | Status: DC | PRN

## 2011-10-05 MED ORDER — BISACODYL 10 MG RE SUPP: 10.0000 mg | Freq: Every day | RECTAL | Status: DC | PRN

## 2011-10-05 MED ORDER — ALPRAZOLAM 0.5 MG PO TABS
0.5000 mg | ORAL_TABLET | Freq: Four times a day (QID) | ORAL | Status: DC | PRN
  Administered 2011-10-05 – 2011-10-19 (×18): 0.5 mg via ORAL
  Filled 2011-10-05 (×18): qty 1

## 2011-10-05 MED ORDER — ALUM & MAG HYDROXIDE-SIMETH 400-400-40 MG/5ML PO SUSP
30.0000 mL | ORAL | Status: DC | PRN
  Filled 2011-10-05: qty 30

## 2011-10-05 MED ORDER — ACETAMINOPHEN 325 MG PO TABS
325.0000 mg | ORAL_TABLET | ORAL | Status: DC | PRN
  Administered 2011-10-05 – 2011-10-06 (×2): 650 mg via ORAL
  Filled 2011-10-05 (×4): qty 2

## 2011-10-05 MED ORDER — ASPIRIN EC 81 MG PO TBEC
81.0000 mg | DELAYED_RELEASE_TABLET | Freq: Every day | ORAL | Status: DC
  Administered 2011-10-05: 81 mg via ORAL
  Filled 2011-10-05: qty 1

## 2011-10-05 MED ORDER — IPRATROPIUM-ALBUTEROL 18-103 MCG/ACT IN AERO
2.0000 | INHALATION_SPRAY | Freq: Four times a day (QID) | RESPIRATORY_TRACT | Status: DC
  Administered 2011-10-06: 2 via RESPIRATORY_TRACT
  Filled 2011-10-05: qty 14.7

## 2011-10-05 MED ORDER — HYDROCODONE-ACETAMINOPHEN 5-325 MG PO TABS
1.0000 | ORAL_TABLET | Freq: Four times a day (QID) | ORAL | Status: DC | PRN
  Administered 2011-10-06 (×2): 2 via ORAL
  Administered 2011-10-06: 1 via ORAL
  Administered 2011-10-09 – 2011-10-15 (×4): 2 via ORAL
  Administered 2011-10-17 – 2011-10-18 (×5): 1 via ORAL
  Administered 2011-10-19: 2 via ORAL
  Filled 2011-10-05 (×6): qty 1
  Filled 2011-10-05: qty 2
  Filled 2011-10-05: qty 1
  Filled 2011-10-05 (×3): qty 2
  Filled 2011-10-05 (×2): qty 1
  Filled 2011-10-05 (×3): qty 2
  Filled 2011-10-05: qty 1

## 2011-10-05 MED ORDER — ALBUTEROL SULFATE (5 MG/ML) 0.5% IN NEBU
2.5000 mg | INHALATION_SOLUTION | RESPIRATORY_TRACT | Status: DC | PRN
  Administered 2011-10-19 (×2): 2.5 mg via RESPIRATORY_TRACT
  Filled 2011-10-05 (×2): qty 0.5
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
## 2011-10-05 NOTE — Progress Notes (Signed)
This patient has been seen and I agree with the findings and treatment plan.  Daveyon Kitchings O. Rashawn Rayman, III, MD, FACS [phone] (pager) [phone] (direct pager) Trauma Surgeon  

## 2011-10-05 NOTE — Progress Notes (Addendum)
Speech Pathology: Dysphagia Treatment Note  Patient was observed with : Mechanical Soft / Ground and Thin liquids.  Patient was noted to have s/s of aspiration : No:    Lung Sounds:  Diminished bilateral breath sounds Temperature: 97.5  Patient required: independently cues to consistently follow precautions/strategies  Other: Pt observed consuming graham crackers and water through a straw. No left buccal pocketing observed. Pt independently following aspiration precautions and shows no s/s of aspiration. Pt with increased attention span, sustaining attention for 15 minutes independently. Pt oriented to person and place, however not to situation. SLP verbally educated pt and wife on aspiration precautions and compensatory strategies  Recommendations:  Continue current diet, will continue to follow up to ensure diet tolerance and for cognitive treatment.   Pain:   none Intervention Required:   No   Goals: Progressing towards goals.   Chyrel Masson, Speech Pathology Student 10/05/2011
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
## 2011-10-05 NOTE — Progress Notes (Signed)
Evaluated for possible admission.  Spoke with wife who would like inpatient rehab.  Wife currently stays with patient on cot in the room.  Dtr can also assist, but she works.  Bed available on inpatient rehab and can admit today.  Call for questions.  Pager [phone]

## 2011-10-05 NOTE — Discharge Summary (Signed)
This patient has been seen and I agree with the findings and treatment plan.  Tyson Masin O. Ernest Orr, III, MD, FACS [phone] (pager) [phone] (direct pager) Trauma Surgeon  

## 2011-10-06 DIAGNOSIS — S069X9A Unspecified intracranial injury with loss of consciousness of unspecified duration, initial encounter: Secondary | ICD-10-CM

## 2011-10-06 DIAGNOSIS — Z5189 Encounter for other specified aftercare: Secondary | ICD-10-CM

## 2011-10-06 DIAGNOSIS — F0390 Unspecified dementia without behavioral disturbance: Secondary | ICD-10-CM

## 2011-10-06 LAB — COMPREHENSIVE METABOLIC PANEL
Albumin: 3.6 g/dL (ref 3.5–5.2)
Alkaline Phosphatase: 86 U/L (ref 39–117)
BUN: 13 mg/dL (ref 6–23)
Calcium: 9.2 mg/dL (ref 8.4–10.5)
Creatinine, Ser: 0.6 mg/dL (ref 0.50–1.35)
GFR calc Af Amer: >90 mL/min (ref >90)
Glucose, Bld: 115 mg/dL — ABNORMAL HIGH (ref 70–99)
Potassium: 3.9 mEq/L (ref 3.5–5.1)
Total Protein: 7.3 g/dL (ref 6.0–8.3)

## 2011-10-06 LAB — CBC
HCT: 36.8 % — ABNORMAL LOW (ref 39.0–52.0)
MCH: 28.8 pg (ref 26.0–34.0)
MCHC: 33.2 g/dL (ref 30.0–36.0)
RDW: 13.3 % (ref 11.5–15.5)

## 2011-10-06 LAB — DIFFERENTIAL
Lymphocytes Relative: 13 % (ref 12–46)
Lymphs Abs: 1.2 10*3/uL (ref 0.7–4.0)
Monocytes Relative: 11 % (ref 3–12)
Neutro Abs: 6.8 10*3/uL (ref 1.7–7.7)
Neutrophils Relative %: 74 % (ref 43–77)

## 2011-10-06 MED ORDER — IPRATROPIUM-ALBUTEROL 18-103 MCG/ACT IN AERO
2.0000 | INHALATION_SPRAY | Freq: Three times a day (TID) | RESPIRATORY_TRACT | Status: DC
  Administered 2011-10-06 – 2011-10-12 (×18): 2 via RESPIRATORY_TRACT

## 2011-10-06 MED ORDER — QUETIAPINE FUMARATE 25 MG PO TABS
25.0000 mg | ORAL_TABLET | Freq: Every day | ORAL | Status: DC
  Administered 2011-10-06 – 2011-10-10 (×5): 25 mg via ORAL
  Filled 2011-10-06 (×6): qty 1

## 2011-10-06 NOTE — Plan of Care (Signed)
Overall Plan of Care [facility]) Patient Details Name: Rondey Fallen MRN: [id number] DOB: 01-07-1932  Diagnosis:    Primary Diagnosis:    TBI (traumatic brain injury) Co-morbidities: COPD  Functional Problem List  Patient demonstrates impairments in the following areas: Balance, Behavior, Cognition, Endurance, Motor, Pain, Perception, Safety and Vision  Basic ADL's: eating, grooming, bathing, dressing and toileting Advanced ADL's: simple meal preparation  Transfers:  bed mobility, bed to chair, toilet, tub/shower, car, furniture and floor Locomotion:  ambulation and stairs  Additional Impairments:  Social Cognition   social interaction, problem solving, memory, attention and awareness  Anticipated Outcomes Item Anticipated Outcome  Eating/Swallowing    Basic self-care  supervision  Tolieting  supervision  Bowel/Bladder  independent  Transfers  supervision  Locomotion  supervision  Communication  Supervision  Cognition  Min A  Pain   Pain manage with PRN at or less than patient stated goal.  Safety/Judgment  Min A  Other     Therapy Plan:         Team Interventions: Item RN PT OT SLP SW TR Other  Self Care/Advanced ADL Retraining   x      Neuromuscular Re-Education  x x      Therapeutic Activities  x x   x   UE/LE Strength Training/ROM  x x   x   UE/LE Coordination Activities  x x   x   Visual/Perceptual Remediation/Compensation   x      DME/Adaptive Equipment Instruction  x x   x   Therapeutic Exercise  x x x  x   Balance/Vestibular Training  x x   x   Patient/Family Education x x x x  x   Cognitive Remediation/Compensation  x x x  x   Functional Mobility Training  x x   x   Ambulation/Gait Training  x    x   Stair Training  x       Teacher, early years/pre Reintegration      x   Dysphagia/Aspiration Film/video editor         Bladder Management          Bowel Management         Disease Management/Prevention         Pain Management x x x      Medication Management x        Skin Care/Wound Management         Splinting/Orthotics         Discharge Planning x        Psychosocial Support      x                      Team Discharge Planning: Destination:  Home Projected Follow-up:  PT, OT, SLP, Home Health vs Outpatient Projected Equipment Needs:  to be determined Patient/family involved in discharge planning:  Yes  MD ELOS: 2wks Medical Rehab Prognosis:  Good Assessment: PT/OT 1-2 hours/day 5-7 days per week, SLP .5-1 hr /day 5 days per wk
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
## 2011-10-06 NOTE — Progress Notes (Signed)
Occupational Therapy Session Note  Patient Details  Name: [name] MRN: [id number] Date of Birth: [birth year]  Today's Date: 10/06/2011 Time: 1430-1500 Time Calculation (min): 30 min  Precautions: Precautions Precautions: Fall Precaution Comments: sitter and wife present Required Braces or Orthoses: Yes Cervical Brace: Applied in supine position;Hard collar Restrictions Weight Bearing Restrictions: No    Skilled Therapeutic Interventions/Progress Updates:  Pt functional ambulated with hand held assist from dayroom to ADL bathroom.   Pt oriented to self and situation.   Pt presented with difficulty recalling what was just asked.   No visual deficits noted during functional tasks.     Focus on functional ambulation, safety awareness, memory, and orientation.      Pain Pain Assessment Pain Type: Acute pain Pain Location: Neck (pt did not rate pain)   Therapy/Group: Individual Therapy  Janett Billow 10/06/2011, 3:12 PM
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
## 2011-10-06 NOTE — Progress Notes (Signed)
Physical Therapy Note  Patient Details  Name: [name] MRN: [id number] Date of Birth: [birth year] Today's Date: 10/06/2011  Time in/out:1500-1540 Individual therapy  Skilled intervention:  Therapeutic activity- addressed balance during gait with min steady assist and problem solving fitting thru tight spots as pt moved thru dayroom trying to locate and touch specific objects (in groups of 3).  After touching object, pt would return to list and and mark an X. Mod questioning cues for memory of items (working memory, able to choose correctly between 2-3 choices when he could not I recall) and mod questioning cues for alternating attention.  Pt states he is unable to read d/t decreased vision and not having glassess but could read and  state specific letters [id number] outloud but not what word the letters spelled. Increased cues as pt fatigued(thought a mirror was a door) Transfers sit to stand close S with BUE from w/c multiple times thruout session.   Pain- none till middle of session then c/o pain in chest, nurse present and aware, pt able to complete session  Michaelene Song 10/06/2011, 3:52 PM
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
## 2011-10-06 NOTE — Progress Notes (Signed)
Patient has not slept well tonight, has gotten out of bed several times and walked out to the desk (with sitter). When reminded of the time patient realizes he should lay back down and sleep and lies down for a short period of time. Received xanax 0.5mg  for restlessness/anxiety (asking to go home, etc) at 2000 and slept for around 2 hours afterward. Patient given tylenol 650mg  at 2240 and vicodin 2 tabs at 0059 for soreness in sternal area. Productive cough with yellow/green sputum noted that patient had spit into a cup. Wife and sitter at bedside. Continue with plan of care.

## 2011-10-06 NOTE — Plan of Care (Signed)
Problem: RH BOWEL ELIMINATION Goal: RH STG MANAGE BOWEL WITH ASSISTANCE moderate Goal: RH STG MANAGE BOWEL W/MEDICATION W/ASSISTANCE moderate  Problem: RH SKIN INTEGRITY Goal: RH STG ABLE TO PERFORM INCISION/WOUND CARE W/ASSISTANCE moderate  Problem: RH SAFETY Goal: RH STG DEMO UNDERSTANDING HOME SAFETY PRECAUTIONS Moderate assistance  Problem: RH PAIN MANAGEMENT Goal: RH STG PAIN MANAGED AT OR BELOW PT'S PAIN GOAL <3  Problem: RH KNOWLEDGE DEFICIT Goal: RH STG INCREASE KNOWLEDGE OF HYPERTENSION Educate patient and wife on diet, exercise, recognizes signs/symptoms of HTN. Educate patient and wife on managing stress, pain control, importance of f/u doctor appointments.

## 2011-10-06 NOTE — Progress Notes (Signed)
Assessment & Plan  Speech Language Pathology Assessment and Plan  ELOS: 2 weeks  Therapy Start Time/End Time: Time Calculation Start Time: 1430 Stop Time: 1500 Time Calculation (min): 30 min  General:  Vital Signs:  Pain:  Pain Assessment:  Denied pain Prior Functioning:  Prior Functional Status Cognitive/Linguistic Baseline: Baseline deficits Baseline deficit details: Mild Dementia per wife Type of Home: House Lives With: Spouse Receives Help From: Family;Friend(s) Vocation: Full time employment  Assessment/Plan: Assessment Clinical Impression Statement: Demonstrates a moderate-severe cognitive impairment marked by confusion overlaying confabulation, poor sustained attention, and a severe memory formation impairment, all of which requiring total-max assist level verbal/visual/semantic cues to complete basic ADL's.  Patient's wife reports a pre-morbid dementia adn she had been wanting to get him diagnosed.  Patient's current behaviors of a Rancho V (confused, inapprorpiate) appear impacted by this likely dementia. Patient will benefit from skilled SLP services to maximize and facilitate cognitive recovery, however given pre-morbid status, patient will likely need 24 hour supervision upon return to home.  Short Term Goals: 1. Patient will be oriented to place/reason with mod assitst to utilize external aids/cues.  2. Patient will demonstrate intellectual awareness of deficits in ADLs with moderate contextual cues. 3. Patient will demonstrate basic problem solving in basic ADLs in minimal verbal/contextual cues.  SLP Recommendation/Assessment: Patient will need skilled Speech Lanaguage Pathology Services in the acute care venue to address identified deficits Problem List: Auditory comprehension;Orientation;Attention;Memory;Problem Solving;Reasoning;Executive Functioning;Social interaction/pragmatics;Thought organization Therapy Diagnosis: Cognitive Impairments Plan Speech Therapy  Frequency: min 6x/week Duration: 2 weeks Treatment/Interventions: Environmental controls;Cueing hierarchy;Cognitive reorganization;Internal/external aids;Functional tasks;Multimodal communcation approach;SLP instruction and feedback;Compensatory strategies;Patient/family education Potential to Achieve Goals: Fair Potential Considerations: Ability to learn/carryover information;Previous level of function Recommendation Follow up Recommendations: Home health SLP Individuals Consulted Consulted and Agree with Results and Recommendations: Family member/caregiver Family Member Consulted : wife FIM: Comprehension Comprehension Mode: Auditory Comprehension: 4-Understands basic 75 - 89% of the time/requires cueing 10 - 24% of the time Expression Expression Mode: Verbal Expression: 5-Expresses basic needs/ideas: With extra time/assistive device Social Interaction Social Interaction: 4-Interacts appropriately 75 - 89% of the time - Needs redirection for appropriate language or to initiate interaction. Problem Solving Problem Solving: 3-Solves basic 50 - 74% of the time/requires cueing 25 - 49% of the time Memory Memory: 1-Recognizes or recalls less than 25% of the time/requires cueing greater than 75% of the time  Discharge Criteria: Patient will be discharged from SLP if patient refuses treatment 3 consecutive times without medical reason, if treatment goals not met, if there is a change in medical status, if patient makes no progress towards goals or if patient is discharged from hospital.  The above assessment, treatment plan, treatment alternatives and goals were discussed and mutually agreed upon: by family  Clinical/Bedside Swallow Evaluation Patient Details  Name: [name] MRN: [id number] DOB: 07/24/1932 Today's Date: 10/06/2011  Assessment/Recommendations/Treatment Plan    SLP Assessment Clinical Impression Statement: Demonstrates a moderate-severe cognitive impairment marked by  confusion overlaying confabulation, poor sustained attention, and a severe memory formation impairment, all of which requiring total-max assist level verbal/visual/semantic cues to complete basic ADL's.  Patient's wife reports a pre-morbid dementia adn she had been wanting to get him diagnosed.  Patient's current behaviors of a Rancho V (confused, inapprorpiate) appear impacted by this likely dementia. Patient will benefit from skilled SLP services to maximize and facilitate cognitive recovery, however given pre-morbid status, patient will likely need 24 hour supervision upon return to home. Risk for Aspiration: None Other Related Risk Factors:  Cognitive impairment  Recommendations Solid Consistency: Regular Liquid Consistency: Thin Liquid Administration via: Cup;Straw Medication Administration: Whole meds with liquid Supervision: Patient able to self feed Compensations: Slow rate;Small sips/bites;Check for pocketing Postural Changes and/or Swallow Maneuvers: Out of bed for meals;Seated upright 90 degrees;Upright 30-60 min after meal Oral Care Recommendations: Oral care BID  Prognosis Prognosis for Safe Diet Advancement: Good Barriers to Reach Goals: Cognitive deficits  Individuals Consulted Consulted and Agree with Results and Recommendations: RN;MD;Family member/caregiver Family Member Consulted: wife  Swallow Study Goals  SLP Swallowing Goals Patient will consume recommended diet without observed clinical signs of aspiration with: Modified independent assistance Patient will utilize recommended strategies during swallow to increase swallowing safety with: Supervision/safety  Myra Rude, M.S.,CCC-SLP Pager [phone]
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
## 2011-10-06 NOTE — Progress Notes (Signed)
Physical Therapy Note  Patient Details  Name: [name] MRN: [id number] Date of Birth: May 14, 1932 Today's Date: 10/06/2011  Individual treatment 1130-1155 (25 minutes) Pain - No complaint of  Vitals- Oxygen sats + 100% 2LNC Focus of treatment - Improve standing balance/safety; Gait training. Pt performed standing and reaching activity to challenge dynamic balance with close supervision maintaining base of support; Pt. Ambulated 160 feet without AD min to close supervision with decreased step length bilaterally. Oxygen Sats post ambulation 92% RA. [name],JIM 10/06/2011, 12:55 PM

## 2011-10-06 NOTE — Progress Notes (Signed)
[facility] Individual Statement of Services  Patient Name:  [name]  Date:  10/06/2011  Welcome to the [facility].  Our goal is to provide you with an individualized program based on your diagnosis and situation, designed to meet your specific needs.  With this comprehensive rehabilitation program, you will be expected to participate in at least 3 hours of rehabilitation therapies Monday-Friday, with modified therapy programming on the weekends.  Your rehabilitation program will include the following services:  Physical Therapy (PT), Occupational Therapy (OT), Speech Therapy (ST), 24 hour per day rehabilitation nursing, Therapeutic Recreaction (TR), Case Management (RN and Child psychotherapist), Rehabilitation Medicine, Nutrition Services and Pharmacy Services  Weekly team conferences will be held on  Tuesday   to discuss your progress.  Your RN Case Designer, television/film set will talk with you frequently to get your input and to update you on team discussions.  Team conferences with you and your family in attendance may also be held.  Depending on your progress and recovery, your program may change.  Your RN Case Estate agent will coordinate services and will keep you informed of any changes.  Your RN Sports coach and SW names and contact numbers are listed  below.  The following services may also be recommended but are not provided by the Inpatient [facility]:   Driving Evaluations  Home Health Rehabiltiation Services  Outpatient Rehabilitatation [facility]  Vocational Rehabilitation   Arrangements will be made to provide these services after discharge if needed.  Arrangements include referral to agencies that provide these services.  Your insurance has been verified to be:  Medicare + BCBS   [Medication coverage through [place] plan] Your primary doctor is:  Dr. Levora Dredge  Pertinent information will be shared with your doctor and  your insurance company.  Case Manager: Melanee Spry, [facility] [phone]  Social Worker:  Amada Jupiter, [place] [phone]  Information discussed with pt and wife and copy given to them by: Brock Ra, 10/06/2011, 1:45 PM

## 2011-10-07 DIAGNOSIS — S069X9A Unspecified intracranial injury with loss of consciousness of unspecified duration, initial encounter: Secondary | ICD-10-CM

## 2011-10-07 DIAGNOSIS — S069XAA Unspecified intracranial injury with loss of consciousness status unknown, initial encounter: Secondary | ICD-10-CM

## 2011-10-07 DIAGNOSIS — Z5189 Encounter for other specified aftercare: Secondary | ICD-10-CM

## 2011-10-07 LAB — URINALYSIS, ROUTINE W REFLEX MICROSCOPIC
Glucose, UA: NEGATIVE mg/dL
Hgb urine dipstick: NEGATIVE
Ketones, ur: 15 mg/dL — AB
Leukocytes, UA: NEGATIVE
pH: 5.5 (ref 5.0–8.0)

## 2011-10-07 NOTE — Progress Notes (Signed)
Patient up all night, became agitated and xanax given to help calm patient. Sitter in room with patient, and walking with patient in hallways min asst. Did not want to lay in the bed after convincing to lay down. No incont episodes this shift. Wife at the bedside also, but stayed in the lounge area due to increased agitation displayed by patient last night. No complaints of pain. Cont plan of care. Anjelo Pullman, Phill Mutter

## 2011-10-07 NOTE — Progress Notes (Signed)
Patient ID: Mattis Featherly, male   DOB: 1932-02-14, 77 y.o.   MRN: [id number] Patient ID: Jerron Niblack, male   DOB: 06/17/1932, 76 y.o.   MRN: [id number] Subjective/Complaints: Review of Systems  Psychiatric/Behavioral: Positive for memory loss. The patient has insomnia.   All other systems reviewed and are negative.   Confused. Non-agitated per staff   Objective: Vital Signs: Blood pressure 141/78, pulse 92, temperature 97.6 F (36.4 C), temperature source Oral, resp. rate 20, height 5\' 11"  (1.803 m), weight 69.088 kg (152 lb 5 oz), SpO2 95.00%. No results found.  Basename 10/06/11 0924  WBC 9.2  HGB 12.2*  HCT 36.8*  PLT 306    Basename 10/06/11 0924  NA 135  K 3.9  CL 96  CO2 28  GLUCOSE 115*  BUN 13  CREATININE 0.60  CALCIUM 9.2   CBG (last 3)  No results found for this basename: GLUCAP:3 in the last 72 hours  Wt Readings from Last 3 Encounters:  10/05/11 69.088 kg (152 lb 5 oz)  09/28/11 74.844 kg (165 lb)  05/20/11 74.39 kg (164 lb)    Physical Exam:  General appearance: alert, no distress and slowed mentation Head: multiple bruises along left side of face Eyes: conjunctivae/corneas clear. PERRL, EOM's intact. Fundi benign. Ears: normal TM's and external ear canals both ears Nose: Nares normal. Septum midline. Mucosa normal. No drainage or sinus tenderness. Throat: lips, mucosa, and tongue normal; teeth and gums normal Neck: no adenopathy, no carotid bruit, no JVD, supple, symmetrical, trachea midline and thyroid not enlarged, symmetric, no tenderness/mass/nodules Back: symmetric, no curvature. ROM normal. No CVA tenderness. Resp: clear to auscultation bilaterally Cardio: regular rate and rhythm, S1, S2 normal, no murmur, click, rub or gallop GI: soft, non-tender; bowel sounds normal; no masses,  no organomegaly Extremities: extremities normal, atraumatic, no cyanosis or edema Pulses: 2+ and symmetric Skin: Skin color, texture, turgor normal. No rashes or  lesions Neurologic: pt is confused.  Oriented to name only.  Can follow simple commands.  No insight or awareness.  Exam non focal during my exam while pt seated. Orientation to person only    Assessment/Plan: 1. Functional deficits secondary to TBI and transverse process fracture C7/T1  which require 3+ hours per day of interdisciplinary therapy in a comprehensive inpatient rehab setting. Physiatrist is providing close team supervision and 24 hour management of active medical problems listed below. Physiatrist and rehab team continue to assess barriers to discharge/monitor patient progress toward functional and medical goals.  Therapy evals underway. ?[place] bed  Mobility: Bed Mobility Bed Mobility: Yes Right Sidelying to Sit: 3: Mod assist Right Sidelying to Sit Details (indicate cue type and reason): c/o dizziness with laying down Transfers Transfers: Yes Stand Pivot Transfers: 4: Min assist Stand Pivot Transfer Details (indicate cue type and reason): steady A for balance and attention to functional task Ambulation/Gait Ambulation/Gait Assistance: 4: Min assist Ambulation/Gait Assistance Details (indicate cue type and reason): steadying assist for balance, slight LOB with turns, backward gait Ambulation Distance (Feet): 100 Feet Stairs: Yes Stairs Assistance: 4: Min assist Stairs Assistance Details (indicate cue type and reason): cues for foot placement on stairs, cues for safety Stair Management Technique: Two rails Number of Stairs: 5  Wheelchair Mobility Wheelchair Mobility: No ADL:   Cognition: Cognition Overall Cognitive Status: Impaired Arousal/Alertness: Awake/alert Orientation Level: Disoriented to place;Disoriented to time;Disoriented to situation;Oriented to person Attention: Sustained Focused Attention: Appears intact Focused Attention Impairment: Verbal basic;Functional basic Sustained Attention: Impaired Sustained Attention Impairment: Verbal basic;Verbal  complex;Functional basic;Functional complex Selective Attention: Impaired Selective Attention Impairment: Verbal basic;Verbal complex;Functional basic;Functional complex Alternating Attention: Impaired Alternating Attention Impairment: Verbal basic;Functional complex;Functional basic;Verbal complex Divided Attention: Impaired Divided Attention Impairment: Functional basic;Verbal basic;Verbal complex;Functional complex Memory: Impaired Memory Impairment: Storage deficit;Retrieval deficit;Decreased recall of new information;Decreased short term memory;Prospective memory Decreased Short Term Memory: Verbal basic;Functional basic Awareness: Impaired Awareness Impairment: Intellectual impairment Problem Solving: Impaired Problem Solving Impairment: Verbal complex;Functional basic;Functional complex Executive Function: Reasoning;Sequencing;Organizing;Decision Making;Self Correcting;Self Monitoring Reasoning: Impaired Reasoning Impairment: Verbal basic;Verbal complex Sequencing: Impaired Sequencing Impairment: Functional basic Organizing: Impaired Organizing Impairment: Verbal complex;Verbal basic;Functional basic;Functional complex Decision Making: Impaired Decision Making Impairment: Verbal basic;Verbal complex;Functional complex Initiating: Appears intact Initiating Impairment: Verbal basic;Functional basic Self Monitoring: Impaired Self Monitoring Impairment: Verbal basic;Functional basic Self Correcting: Impaired Self Correcting Impairment: Verbal complex;Functional complex;Functional basic Behaviors: Perseveration;Confabulation;Impulsive Safety/Judgment: Impaired Comments: Poor safety awareness [place] Scales of Cognitive Functioning: Confused/inappropriate/non-agitated Cognition Arousal/Alertness: Awake/alert Orientation Level: Disoriented to place;Disoriented to time;Disoriented to situation;Oriented to person   1.TBI with transverse process fracture C7/T1-PT/OT/SLP  eval and treat.Cervical collar x 6 weeks.Presently place brace supine but will confirm with neurosurgery especially given cognitive status.  1. DVT Prophylaxis/Anticoagulation: S.Q lovenox-Monitor platelet counts and any bleeding episodes.Follow up labs today.  Resumed ASA 2. Pain Management: Norco prn.Monitor mental status   3. Mood:  sleep chart.Sitter currently at bedside.Discontinue haldol.Continue xanax/ativan prn.may need scheduled med for restlessness.Patient on Xanax 0.5mg  daily PTA for H/O anxiety. There is a report of premorbid dementia, but given his functional history I would tend to downplay such reports.   -Begin low dose seroquel for sleep.  -Check U/A C and S-result pending   4.Tobacco abuse/COPD-Taper nocoderm patch.Continue proventil/combivent.Check oxygen sat every shift.   5.HTN-Currently on no present meds.Blood pressure with some increased variables.Patient on HCTZ 12.5 daily and lopressor 25mg   HS prior to admission. Noted H/O STEMI.Will resume home meds and monitor with increased activity.      LOS (Days) 2  Milliana Reddoch E 10/07/2011, 8:45 AM

## 2011-10-07 NOTE — Progress Notes (Signed)
Occupational Therapy Note  Patient Details  Name: [name] MRN: [id number] Date of Birth: 12-05-1931 Today's Date: 10/07/2011  Time: 730-830 Time No c/o pain  1:1 session: self care retraining at sink level. Focus on self feeding with focused to sustained attention, only oriented to self , despite numerous attempts with different modes, difficulty seeing today and couldn't read orientation pieces, but could scribe his name and semi copy the day "Friday." Simple problem solving related to ADLs with mod A. Had pt lay done to A him in shaving with electric razor and replace missing chin pad. Pt able to demonstrate distant supervision for standing balance at sink with grooming and steady A with functional ambulation in room and in hallway.   Melonie [place] 10/07/2011, 8:32 AM

## 2011-10-07 NOTE — Progress Notes (Signed)
Physical Therapy Session Note  Patient Details  Name: [name] MRN: [id number] Date of Birth: [birth year]  Today's Date: 10/07/2011 Time: 1330-1430 Time Calculation (min): 60 min  Precautions: Precautions Precautions: Fall Precaution Comments: sitter and wife present Required Braces or Orthoses: Yes Cervical Brace: Applied in supine position;Hard collar Restrictions Weight Bearing Restrictions: No  Short Term Goals: PT Short Term Goal 1: Pt will perform functional transfers with supervision PT Short Term Goal 2: Pt will gait in controlled environment 150' wtih supervision PT Short Term Goal 3: pt will demo emergent awareness with min A for functional tasks  Skilled Therapeutic Interventions/Progress Updates:      General   Vital Signs   Pain Pain Assessment Pain Assessment: No/denies pain Mobility   Locomotion  Ambulation Ambulation/Gait Assistance: 4: Min assist Ambulation Distance (Feet): 200 Feet Assistive device:  (no AD) Gait Gait Pattern: Decreased step length - right;Decreased step length - left Gait velocity: slow, pt wanting to hold handrail High Level Ambulation High Level Ambulation - Other Comments: catching ball while walking  Stairs / Additional Locomotion Stairs: No  Trunk/Postural Assessment     Balance performed alternating attention with balance activity and simple problem solving to change batteries in pulse ox. Pt requires supervision for simple task, and max cues for complex task of wheel chair brake parts. Also pt. Fatigued when asked to look at wheelchair brakes.     Exercises    Other Treatments    Therapy/Group: Individual Therapy  Julian Reil 10/07/2011, 4:23 PM

## 2011-10-07 NOTE — Progress Notes (Signed)
Pt is alert and oriented with confusion,  Pt denies pain.  Pt has been very cooperative and pleaasnt during shift.  Wife at bedside during shift.  Pt ambulates to the bathroom and had 2 medium bowel movementt and is continent of urine. Pt enjoys walking in the halls with staff [name]

## 2011-10-07 NOTE — Progress Notes (Signed)
Social Work   Met this afternoon with patient's wife to address need for her to have some time away from hospital in order to get some better sleep/rest.  Wife agrees that she is exhausted from getting little sleep at night due to patient's agitation, yet she feels her presence helps keep him from getting "completely out of control".  She then expresses concern that "if he keeps doing this every night then y'all are gonna kick him out of here".   Explained to wife that his behavioral issues are exactly why we have a sitter with him 24/7 and reassured her that we would not "kick him out".   Wife notes a friend would be in this afternoon to bring pt's clothes and that she would plan to have this person take her home where she can stay "and get some rest and check on things".   Alerted RN and OT to wife's plans.  RN aware that wife is afraid pt will get upset if he sees her leaving, therefore, staff will monitor pt when wife is ready to go.  Nahia Nissan

## 2011-10-07 NOTE — Progress Notes (Signed)
Speech Language Pathology Therapy Note  Patient Details  Name: [name] MRN: [id number] Date of Birth: 18-Nov-1931  Today's Date: 10/07/2011 Time: 1330-1430 Time Calculation (min): 60 min  Precautions: Precautions Precautions: Fall Precaution Comments: sitter and wife present Required Braces or Orthoses: Yes Cervical Brace: Applied in supine position;Hard collar Restrictions Weight Bearing Restrictions: No  Skilled Therapeutic Interventions: Treatment focus on sustained attention, orientation and functional problem solving. Pt with Mod A verbal and visual cues for organization and problem solving to make a calendar for the month of December and Max A verbal and visual cues for money management with counting and making change. Pt able to orient to person, place and situation independently. No confabulation or language of confusion noted throughout session. Sustained attention to tasks for ~20 minutes with Mod A verbal cues. Wife present for half of session.    Therapy/Group: Individual Therapy  [name] 10/07/2011 2:41 PM

## 2011-10-07 NOTE — Progress Notes (Signed)
Physical Therapy Note  Patient Details  Name: [name] MRN: [id number] Date of Birth: [birth year] Today's Date: 10/07/2011  Time: 845-943 58 minutes  No c/o pain. Pt found to be more confused this AM.  Unable to state where he is, reason for visit despite explanation and cues.  Pt with focused attention 5-10 seconds throughout treatment.  Gait without AD with min A for balance with obstacles or when pt is distracted.  Finding/playing horseshoe activity with focus on attention, problem solving, scanning environment, balance challenges.  Pt max A for attention to task, max cuing required for scanning both sides of environment, min A for balance challenges.  Coffee making activity with pt requiring only min cues for scanning environment, problem solving improved with familiar tasks.  Finding, gait to pt's room with total assist for locating signs to assist with search.  Pt unable to identify that room beside his room number was his room.  Pt fatigues easily with activity and requires more frequent rest breaks today.  Individual Therapy   Ashia Dehner 10/07/2011, 9:51 AM

## 2011-10-07 NOTE — Progress Notes (Addendum)
Occupational Therapy Note  Patient Details  Name: Klinton Candelas MRN: [id number] Date of Birth: [birth year] Today's Date: 10/07/2011  Time: 09-1129 Time calculation: 30 min  No c/o of pain.  Pt did c/o of being hot, face and neck warm to touch, took temp 99.6.  RN and NT aware.  1:1 cognitive re-education: focus on orientation information with contextual cues with max A.  Memory of recent events- spotty. Focus on task sequencing and organization, working memory, simple problem solving, visual scanning with pipe tree; building figure from picture with mod cuing with extra time.  Pt demonstrated sustained attention in a quiet environment for 15-45min. Pt able to attend to both sides with minimal cuing.  Melonie Florida 10/07/2011, 11:58 AM

## 2011-10-08 DIAGNOSIS — S069X9A Unspecified intracranial injury with loss of consciousness of unspecified duration, initial encounter: Secondary | ICD-10-CM

## 2011-10-08 DIAGNOSIS — Z5189 Encounter for other specified aftercare: Secondary | ICD-10-CM

## 2011-10-08 LAB — URINE CULTURE
Colony Count: NO GROWTH
Culture  Setup Time: 201211301534
Culture: NO GROWTH

## 2011-10-08 NOTE — Progress Notes (Signed)
Addendum, medicated with xanax at 1207, pt to lay down and rest at 1230 for 30 minutes with sitter at bedside

## 2011-10-08 NOTE — Progress Notes (Signed)
Speech Language Pathology Therapy Note  Patient Details  Name: Matix Henshaw MRN: [id number] Date of Birth: [birth year]  Today's Date: 10/08/2011 Time: 1535-1600 Time Calculation (min): 25 min  Short Term Goals: 1. Patient will be oriented to place/reason with mod assitst to utilize external aids/cues.  2. Patient will demonstrate intellectual awareness of deficits in ADLs with moderate contextual cues.  3. Patient will demonstrate basic problem solving in basic ADLs in minimal verbal/contextual cues.   Skilled Therapeutic Interventions/Progress Updates: Pt was seen today to address cognitive skills for attention, awareness and visual sequencing in a quiet environment in the speech office. Pt was oriented to place, required mod verbal question cues to appropriately respond to reason for hospital stay. Pt sequenced simple 6 step cards with mod to max A for order. Pt described actions in each picture with min verbal cues and attended to this activity for 10 mins with supervision. Pt identified safety issues in  picture cards with 100% accuracy given minimal cues. Basic level reasoning tasks completed today to determine similarities/differences between two objects was 2/6 with mod to max verbal question cues.    Continue speech therapy per plan of care.   Precautions/Restrictions  Precautions Precaution Comments: falls, awareness, safety attention   Pain Pain Assessment Pain Assessment: No/denies pain Pain Score: 0-No pain   Therapy/Group: Individual Therapy  Waldo Laine 10/08/2011 4:26 PM

## 2011-10-08 NOTE — Progress Notes (Signed)
Physical Therapy Session Note  Patient Details  Name: [name] MRN: [id number] Date of Birth: December 28, 1931  Today's Date: 10/08/2011 Time: 0815-0903 Time Calculation (min): 48 min No Pain  Short Term Goals: PT Short Term Goal 1: Pt will perform functional transfers with supervision PT Short Term Goal 2: Pt will gait in controlled environment 150' wtih supervision PT Short Term Goal 3: pt will demo emergent awareness with min A for functional tasks  Skilled Therapeutic Interventions/Progress Updates:  Tx focused on dynamic balance during functional activities while performing problem solving tasks and sustained attention on tasks: Pt challenged through tx to begin a task, switch tasks and recall first task, required Min/Mod A for recall. Tasks included sorting tools into categories in [facility] repair toolboxes (pt used to work in mills on machinery), carry tool boxes to different locations in the gym, find a certain tool for a specific task, and return tool box to it's place. Pt able to demonstrate sustained attention for up to 4 minutes with Min cues for continuing next task once first once complete. For instance, once pt had gathered all screw drivers into one box, he continued to fiddle with tools and did not recall the next step. When discussing the length of the // bars, pt said "there's only one way to solve that problem. Where's a tape measurer?" Pt challenged to go to day room and return to gym, which he was able to do with S, but unable to recall the next task once int he gym. Pt needed Mod cues to find room. Pt had no LOB and performed dynamic gait in gym around objects with Min A for steadying at times, S for sit<>stand without UE A.     Therapy/Group: Individual Therapy  Iona Coach, PT 10/08/2011, 9:13 AM

## 2011-10-08 NOTE — Progress Notes (Signed)
Occupational Therapy Session Note  Patient Details  Name: [name] MRN: [id number] Date of Birth: Jun 19, 1932  Today's Date: 10/08/2011 Time: 10:15 to 11am 45 minutes  Precautions: Precautions Precautions: Fall Precaution Comments: falls, awareness, safety attention  Required Braces or Orthoses: Yes Cervical Brace: Applied in supine position;Hard collar Restrictions Weight Bearing Restrictions: No  Short Term Goals: OT Short Term Goal 1: Pt will don UB clothing with setup OT Short Term Goal 2: Pt will don LB clothing with setup OT Short Term Goal 3: Pt will perform toileting with distant S OT Short Term Goal 4: Pt will perform bed mobility in prep for ADL task mod I OT Short Term Goal 5: Pt will demonstrated selective attention with min cuing during 30 min of functional activity  Skilled Therapeutic Interventions/Progress Updates: am toileting and ADL standing at sink with focus on attention and orientation.  Patient not oriented to situation;  Also completed helped work on orientation to place, date and situation quizzing patient a few times on same questions  Vital Signs Therapy Vitals Temp: 97.8 F (36.6 C) Temp src: Oral Pulse Rate: 99  Resp: 18  BP: 164/80 mmHg Patient Position, if appropriate: Sitting Oxygen Therapy SpO2: 95 % O2 Device: None (Room air) O2 Flow Rate (L/min): 2 L/min Pain none  Therapy/Group: Individual Therapy  [facility] 10/08/2011, 4:35 PM

## 2011-10-08 NOTE — Progress Notes (Signed)
Patient ID: [name], male DOB: 06-Oct-1932, 77 y.o.  MRN: [id number]  Subjective: Pleasant, cheerful. Denies pain or problems. neighbor and sitter at side  Objective: Vital signs in last 24 hours: Temp:  [97.8 F (36.6 C)-98.6 F (37 C)] 97.8 F (36.6 C) (12/01 0620) Pulse Rate:  [79-88] 79  (12/01 0620) Resp:  [18-22] 18  (12/01 0620) BP: (148-154)/(70-77) 154/70 mmHg (12/01 0620) SpO2:  [97 %-99 %] 99 % (12/01 0620) Weight change:  Last BM Date: 10/07/11  Intake/Output from previous day: 11/30 0701 - 12/01 0700 In: 840 [P.O.:840] Out: -  Last cbgs: CBG (last 3)  No results found for this basename: GLUCAP:3 in the last 72 hours   Physical Exam General: No apparent distress. Sitter at side    Lungs: Normal effort. Lungs clear to auscultation, no crackles or wheezes. Cardiovascular: Regular rate and rhythm, no edema Musculoskeletal:  Neurovascularly intact Neurological: No new neurological deficits - AAOx3    Lab Results:  [facility] 10/06/11 0924  WBC 9.2  HGB 12.2*  HCT 36.8*  PLT 306   BMET  Basename 10/06/11 0924  NA 135  K 3.9  CL 96  CO2 28  GLUCOSE 115*  BUN 13  CREATININE 0.60  CALCIUM 9.2    Studies/Results: No results found.  Medications: I have reviewed the patient's current medications.  Assessment/Plan: 1. Functional deficits secondary to TBI and transverse process fracture C7/T1 which require 3+ hours per day of interdisciplinary therapy in a comprehensive inpatient rehab setting.  Physiatrist is providing close team supervision and 24 hour management of active medical problems listed below.  Physiatrist and rehab team continue to assess barriers to discharge/monitor patient progress toward functional and medical goals.  2. DVT Prophylaxis/Anticoagulation: S.Q lovenox-Monitor platelet counts and any bleeding episodes. Follow up labs today. on ASA   3. Pain Management: Norco prn. Monitor mental status   4. Mood: sleep chart. Sitter  currently at bedside.Discontinue haldol.Continue xanax/ativan prn. Patient on Xanax 0.5mg  daily PTA for anxiety. There is a report of premorbid dementia, but given his functional history I would tend to downplay such reports.  continue low dose seroquel for sleep.   5.Tobacco abuse/COPD-Taper nocoderm patch.Continue proventil/combivent. Check oxygen sat every shift.   6. hypertension -Currently on no meds. Blood pressure with some increased variables.Patient on HCTZ 12.5 daily and lopressor 25mg  HS prior to admission. Noted hx STEMI. Will resume home meds and monitor with increased activity.      Rene Paci , MD 10/08/2011, 10:35 AM

## 2011-10-08 NOTE — Progress Notes (Signed)
Pt restless for most of the night. Slept in half hour to hour increments all night. Pt slept for a total of 2 and 1/2 hr this shift. Pt uncooperative at times and needed max cueing and distraction/redirection. Pt wandered quite a bit and did attempt to go into other pt's room. Pt does have 1:1 sitter for safety.

## 2011-10-08 NOTE — Progress Notes (Signed)
Pt alert to self, sitter at bedside for safety, pt impulsive, poor safety awareness, confusion, combative prn, aspen collar on at all times, no voiced complaints, OOB with one assistancet for stand by, steady gait noted, continent of bowel/bladder last BM 11/30, bruising noted to lt side face, right upper arm, abrasion to lt scapula with Allevyn to site, left upper extremity with small scabbed abrasion noted, size of pea, open to air, medicated with xanax for noted anxiety , pt became combative with sitter when up ambulating and attempted to get on elevator, instructed pt to only able to ambulate in 4000 area with voiced understanding, and doors shut to elevator halls at this time, will continue to monitor

## 2011-10-09 NOTE — Progress Notes (Signed)
Patient alert to self only- pleasantly confused. Patient has been anxious at times but able to be reoriented and redirected so far this shift. Patient has napped on and off today for short intervals. Patient continent bowel and bladder with last bowel movement on 12/1. Patient has Allevyn to right scapula intact. Patient has brusing to left side of face and right arm. Medium size scab noted to left arm OTA. Patient has been up walking some with sitter- close guard assist. Pt has denied any pain. No new complaints noted. Continue with plan of care.

## 2011-10-09 NOTE — Progress Notes (Signed)
Physical Therapy Session Note  Patient Details  Name: [name] MRN: [id number] Date of Birth: January 10, 1932  Today's Date: 10/09/2011 Time: 4132-4401 Time Calculation (min): 45 min  Precautions: Precautions Precautions: Fall Precaution Comments: falls, awareness, safety attention  Required Braces or Orthoses: Yes Cervical Brace: Applied in supine position;Hard collar Restrictions Weight Bearing Restrictions: No  Short Term Goals: PT Short Term Goal 1: Pt will perform functional transfers with supervision PT Short Term Goal 2: Pt will gait in controlled environment 150' wtih supervision PT Short Term Goal 3: pt will demo emergent awareness with min A for functional tasks  Skilled Therapeutic Interventions/Progress Updates: Focus of treatment: Pt. To perform dynamic standing balance activity to improve balance reactions and include cognitive activity (addition)     General   Vital Signs Oxygen Therapy O2 Device: Nasal cannula O2 Flow Rate (L/min): 2 L/min Pain Pain Assessment Pain Assessment: No/denies pain Pain Score: 0-No pain  Orientation: oriented to person only    Locomotion  Ambulation Ambulation/Gait Assistance: 5: Supervision 200 + feet/ max cues for direction finding on unit with use of direction signs.      Balance: Pt participated in dynamic standing balance activity (horseshoes) close supervision. Pt. Challenged to keep score- required max cues to add appropriate scores.     Exercises Crosstrainer (Nustep) X 10 minutes Level 4 to improve tolerance to activity monitoring oxygen sats. Oxygen sats post activity 100% 2LNC       Therapy/Group: Individual Therapy  Alyissa Whidbee,JIM 10/09/2011, 4:04 PM

## 2011-10-09 NOTE — Progress Notes (Addendum)
Occupational Therapy Session Note  Patient Details  Name: [name] MRN: [id number] Date of Birth: [birth year]  Today's Date: 10/08/2011 Time:13:30-14:30    Precautions: Precautions Precautions: Fall Precaution Comments: falls, awareness, safety attention  Required Braces or Orthoses: Yes Cervical Brace: Applied in supine position;Hard collar Restrictions Weight Bearing Restrictions: No  Short Term Goals: OT Short Term Goal 1: Pt will don UB clothing with setup OT Short Term Goal 2: Pt will don LB clothing with setup OT Short Term Goal 3: Pt will perform toileting with distant S OT Short Term Goal 4: Pt will perform bed mobility in prep for ADL task mod I OT Short Term Goal 5: Pt will demonstrated selective attention with min cuing during 30 min of functional activity  Skilled Therapeutic Interventions/Progress Updates:    Pt. Engaged in perceptual motor, problem solving, functional balance, and attention.  He used sustained attention for 20 minutes in pipe tree actvity. Had difficulty with spatial concept in the design.  Did balance activities using the bocce ball and minimal physical cues for bending over to get balls from floor.       Therapy/Group: Individual Therapy  [name] 10/09/2011, 3:53 PM

## 2011-10-09 NOTE — Progress Notes (Signed)
Patient ID: [name], male DOB: 1931-12-23, 77 y.o.  MRN: [id number]  Subjective: Pleasant, cheerful. Denies pain or problems. A bit more confused today.  Objective: Vital signs in last 24 hours: Temp:  [96.9 F (36.1 C)-97.8 F (36.6 C)] 96.9 F (36.1 C) (12/02 0637) Pulse Rate:  [86-99] 86  (12/02 0637) Resp:  [16-18] 16  (12/02 0637) BP: (164-179)/(80) 179/80 mmHg (12/02 0637) SpO2:  [94 %-100 %] 96 % (12/02 0805) Weight change:  Last BM Date: 10/07/11  Intake/Output from previous day: 12/01 0701 - 12/02 0700 In: 598 [P.O.:598] Out: -  Last cbgs: CBG (last 3)  No results found for this basename: GLUCAP:3 in the last 72 hours   Physical Exam General: No apparent distress. Sitter at side    Lungs: Normal effort. Lungs clear to auscultation, no crackles or wheezes. Cardiovascular: Regular rate and rhythm, no edema Musculoskeletal:  Neurovascularly intact Neurological: No new neurological deficits - AA but confused, requesting call to family    Lab Results: No results found for this basename: WBC:2,HGB:2,HCT:2,PLT:2 in the last 72 hours BMET No results found for this basename: NA:2,K:2,CL:2,CO2:2,GLUCOSE:2,BUN:2,CREATININE:2,CALCIUM:2 in the last 72 hours  Studies/Results: No results found.  Medications: I have reviewed the patient's current medications.  Assessment/Plan: 1. Functional deficits secondary to TBI and transverse process fracture C7/T1 which require 3+ hours per day of interdisciplinary therapy in a comprehensive inpatient rehab setting.  Physiatrist is providing close team supervision and 24 hour management of active medical problems listed below. Physiatrist and rehab team continue to assess barriers to discharge/monitor patient progress toward functional and medical goals.  2. DVT Prophylaxis/Anticoagulation: S.Q lovenox-Monitor platelet counts and any bleeding episodes. Follow up labs today. on ASA   3. Pain Management: Norco prn. Monitor mental  status   4. Mood: sleep chart. Sitter currently at bedside. Continue xanax/ativan prn. Patient on Xanax 0.5mg  daily PTA for anxiety. There is a report of premorbid dementia, but given his functional history I would tend to downplay such reports.  continue low dose seroquel for sleep.   5.Tobacco abuse/COPD-Taper nocoderm patch.Continue proventil/combivent. Check oxygen sat every shift.   6. hypertension - Blood pressure with some increased variables. Patient on HCTZ 12.5 daily and lopressor 25mg  HS. Noted hx STEMI. On home meds and monitor with increased activity. Continue to monitor. If SBP stays elevated, may need to adjust BP meds.     Lang Snow , PA-C 10/09/2011, 9:31 AM   Patient personally seen and examined by me. Agree as here.  Rene Paci, MD 10:01 AM

## 2011-10-10 DIAGNOSIS — Z5189 Encounter for other specified aftercare: Secondary | ICD-10-CM

## 2011-10-10 DIAGNOSIS — S069XAA Unspecified intracranial injury with loss of consciousness status unknown, initial encounter: Secondary | ICD-10-CM

## 2011-10-10 DIAGNOSIS — S069X9A Unspecified intracranial injury with loss of consciousness of unspecified duration, initial encounter: Secondary | ICD-10-CM

## 2011-10-10 NOTE — Progress Notes (Signed)
Speech Language Pathology Therapy Note  Patient Details  Name: [name] MRN: [id number] Date of Birth: May 21, 1932  Today's Date: 10/10/2011 Time: 1300-1325 Time Calculation (min): 25 min  Precautions: Precautions Precautions: Fall Precaution Comments: falls, awareness, safety attention  Required Braces or Orthoses: Yes Cervical Brace: Applied in supine position;Hard collar Restrictions Weight Bearing Restrictions: No   Skilled Therapeutic Interventions: Pt asleep in chair. Difficult to arouse but finally woke up due to coughing episode. Ambulated to sink to expel mucous with eyes closed and Max verbal and tactile cues needed for safety. Total A to orient to place and time. Pt speaking to clinician with eyes closed and refused to get up and responded " lets just wait and see what happens" consistently to all requests. Pt eventually fell back asleep in his chair and was unable to arouse. Spoke with RN who reported pt recently received xanax due to anxiety and mild agitation. Pt missed 35 minutes of skilled SLP services.   Pain: No/Denies Pain   Therapy/Group: Individual Therapy  Shanekqua Schaper 10/10/2011 1:30 PM

## 2011-10-10 NOTE — Progress Notes (Signed)
Patient ID: [name], male   DOB: Aug 31, 1932, 77 y.o.   MRN: [id number] Patient ID: [name], male   DOB: November 21, 1931, 77 y.o.   MRN: [id number] Patient ID: [name], male   DOB: 11-24-1931, 77 y.o.   MRN: [id number] Subjective/Complaints: Review of Systems  Psychiatric/Behavioral: Positive for memory loss. The patient has insomnia.   All other systems reviewed and are negative.   Confused. Non-agitated per staff   Objective: Vital Signs: Blood pressure 153/81, pulse 84, temperature 97.4 F (36.3 C), temperature source Oral, resp. rate 20, height 5\' 11"  (1.803 m), weight 69.088 kg (152 lb 5 oz), SpO2 93.00%. No results found. No results found for this basename: WBC:2,HGB:2,HCT:2,PLT:2 in the last 72 hours No results found for this basename: NA:2,K:2,CL:2,CO2:2,GLUCOSE:2,BUN:2,CREATININE:2,CALCIUM:2 in the last 72 hours CBG (last 3)  No results found for this basename: GLUCAP:3 in the last 72 hours  Wt Readings from Last 3 Encounters:  10/05/11 69.088 kg (152 lb 5 oz)  09/28/11 74.844 kg (165 lb)  05/20/11 74.39 kg (164 lb)    Physical Exam:  General appearance: alert, no distress and slowed mentation Head: multiple bruises along left side of face Eyes: conjunctivae/corneas clear. PERRL, EOM's intact. Fundi benign. Ears: normal TM's and external ear canals both ears Nose: Nares normal. Septum midline. Mucosa normal. No drainage or sinus tenderness. Throat: lips, mucosa, and tongue normal; teeth and gums normal Neck: no adenopathy, no carotid bruit, no JVD, supple, symmetrical, trachea midline and thyroid not enlarged, symmetric, no tenderness/mass/nodules Back: symmetric, no curvature. ROM normal. No CVA tenderness. Resp: clear to auscultation bilaterally Cardio: regular rate and rhythm, S1, S2 normal, no murmur, click, rub or gallop GI: soft, non-tender; bowel sounds normal; no masses,  no organomegaly Extremities: extremities normal, atraumatic, no cyanosis or edema Pulses:  2+ and symmetric Skin: Skin color, texture, turgor normal. No rashes or lesions Neurologic: pt is confused.  Oriented to name only.  Can follow simple commands.  No insight or awareness.  Exam non focal during my exam while pt seated. Orientation to person only    Assessment/Plan: 1. Functional deficits secondary to TBI and transverse process fracture C7/T1  which require 3+ hours per day of interdisciplinary therapy in a comprehensive inpatient rehab setting. Physiatrist is providing close team supervision and 24 hour management of active medical problems listed below. Physiatrist and rehab team continue to assess barriers to discharge/monitor patient progress toward functional and medical goals.  Therapy evals underway. ?[place] bed  Mobility: Bed Mobility Bed Mobility: Yes Right Sidelying to Sit: 3: Mod assist Right Sidelying to Sit Details (indicate cue type and reason): c/o dizziness with laying down Transfers Transfers: Yes Stand Pivot Transfers - DO NOT USE: 4: Min assist Stand Pivot Transfer Details (indicate cue type and reason): steady A for balance and attention to functional task Ambulation/Gait Ambulation/Gait Assistance: 5: Supervision Ambulation/Gait Assistance Details (indicate cue type and reason): steadying assist for balance, slight LOB with turns, backward gait Ambulation Distance (Feet): 200 Feet Assistive device:  (no AD) Gait Pattern: Decreased step length - right;Decreased step length - left Gait velocity: slow, pt wanting to hold handrail Stairs: No Stairs Assistance: 4: Min assist Stairs Assistance Details (indicate cue type and reason): cues for foot placement on stairs, cues for safety Stair Management Technique: Two rails Number of Stairs: 5  Wheelchair Mobility Wheelchair Mobility: No ADL:   Cognition: Cognition Overall Cognitive Status: Impaired Arousal/Alertness: Awake/alert Orientation Level: Oriented to person;Disoriented to person;Disoriented  to place;Disoriented to time;Disoriented to situation  Attention: Alternating Focused Attention: Appears intact Focused Attention Impairment: Verbal basic;Functional basic Sustained Attention: Impaired Sustained Attention Impairment: Verbal complex Selective Attention: Impaired Selective Attention Impairment: Verbal complex Alternating Attention: Impaired Alternating Attention Impairment: Verbal complex;Functional basic Divided Attention: Impaired Divided Attention Impairment: Functional basic;Verbal basic;Verbal complex;Functional complex Memory: Impaired Memory Impairment: Storage deficit;Retrieval deficit;Decreased recall of new information;Decreased short term memory;Prospective memory Decreased Short Term Memory: Verbal basic;Functional basic Awareness: Impaired Awareness Impairment: Intellectual impairment Problem Solving: Impaired Problem Solving Impairment: Verbal complex;Functional basic;Functional complex Executive Function: Reasoning;Sequencing;Organizing;Decision Making;Self Correcting;Self Monitoring Reasoning: Impaired Reasoning Impairment: Verbal basic;Verbal complex Sequencing: Impaired Sequencing Impairment: Functional basic Organizing: Impaired Organizing Impairment: Verbal complex;Verbal basic;Functional basic;Functional complex Decision Making: Impaired Decision Making Impairment: Verbal basic;Verbal complex;Functional complex Initiating: Appears intact Initiating Impairment: Verbal basic;Functional basic Self Monitoring: Impaired Self Monitoring Impairment: Verbal basic;Functional basic Self Correcting: Impaired Self Correcting Impairment: Verbal complex;Functional complex;Functional basic Behaviors: Perseveration;Confabulation;Impulsive Safety/Judgment: Impaired Comments: performed dynamic balance focusing on alternating attention naming cars and catching a ball while walking min assist for mobility mod assist for attention [facility] Scales of  Cognitive Functioning: Confused/inappropriate/non-agitated Cognition Arousal/Alertness: Awake/alert Orientation Level: Oriented to person;Disoriented to person;Disoriented to place;Disoriented to time;Disoriented to situation   1.TBI with transverse process fracture C7/T1-PT/OT/SLP eval and treat.Cervical collar x 6 weeks.Presently place brace supine but will confirm with neurosurgery especially given cognitive status.  1. DVT Prophylaxis/Anticoagulation: S.Q lovenox-Monitor platelet counts and any bleeding episodes.Follow up labs today.  Resumed ASA 2. Pain Management: Norco prn.Monitor mental status   3. Mood:  sleep chart.Sitter currently at bedside.Discontinue haldol.Continue xanax/ativan prn.may need scheduled med for restlessness.Patient on Xanax 0.5mg  daily PTA for H/O anxiety. There is a report of premorbid dementia, but given his functional history I would tend to downplay such reports.   -Begin low dose seroquel for sleep.  -Check U/A C and S-Negativ 4.Tobacco abuse/COPD-Taper nocoderm patch.Continue proventil/combivent.Check oxygen sat every shift.   5.HTN-Currently on no present meds.Blood pressure with some increased variables.Patient on HCTZ 12.5 daily and lopressor 25mg   HS prior to admission. Noted H/O STEMI.Will resume home meds and monitor with increased activity.      LOS (Days) 5  Shanette Tamargo E 10/10/2011, 7:57 AM

## 2011-10-10 NOTE — Progress Notes (Signed)
Occupational Therapy Note  Patient Details  Name: [name] MRN: [id number] Date of Birth: 06/22/1932 Today's Date: 10/10/2011  Time : 11:30-12:00 ( )  Pt with no c/o pain. 1:1 Pt wearing O2 on 2 liters on and off during session.  Pt desats to 80% without it and minimal activity.  Attempted to walk down to ADL apartment for functional activity and for a simulated tub shower transfer without grab bars; however pt would not walk that far down the hallway, distracted by people in hallway and became more disoriented.  Pt able to be directed to engage in game of checkers, to focus on working memory, orientation, turn taking, focused to sustained attention with max A to play again. Pt with c/o of being hot and very anxious, alerted RN and using different modalities to calm self.  Didn't become agitated.    Melonie Florida 10/10/2011, 12:27 PM

## 2011-10-10 NOTE — Progress Notes (Signed)
Physical Therapy Note  Patient Details  Name: [name] MRN: [id number] Date of Birth: Mar 26, 1932 Today's Date: 10/10/2011  Time: 812-910 58 minutes  No c/o pain.  Pt with continuing increased confusion today. Oriented to self only, total assist for place, situation.  Gait in hallways and during functional tasks with supervision to min A.  Min A required for losses of balance when distracted in busy environments.  Pt with decreased selective attention to task, focused attention about 30 seconds in quiet environment.  Standing bean bag toss game for balance, activity tolerance and mental math.  Pt able to add simple numbers in head, required cues for working memory and attention to task. Pt with increased SOB with activity today. Required frequent rest breaks and 2 L 02  throughout session. O2 90-99% on 2L O2.  Ramp and curb training and car transfers with supervision for safety awareness and technique.  Cognitive challenge to find pt's room when given room number.  Pt required cuing to follow arrows on signs, improved problem solving since last visit with this PT.  [name] toss activity for alternating attention, problem solving and increased endurance. Pt able to perform alternating attention to game with min cues.  Individual therapy  [name] 10/10/2011, 10:20 AM

## 2011-10-10 NOTE — Progress Notes (Signed)
Pt alert and oriented to self.  Pt became agitated and attempted to open the door, than attempted to kick staff, xanax given with good results.  Pt continent of bladder.  Pt ambulates with supervision to the bathroom.  Sitter at bedside through out shift Nemiah Commander

## 2011-10-10 NOTE — Progress Notes (Addendum)
Occupational Therapy Session Note  Patient Details  Name: [name] MRN: [id number] Date of Birth: [birth year]  Today's Date: 10/10/2011 Time: 2956-2130 Time Calculation (min): 60 min  Precautions: Precautions Precautions: Fall Precaution Comments: falls, awareness, safety attention  Required Braces or Orthoses: Yes Cervical Brace: Applied in supine position;Hard collar Restrictions Weight Bearing Restrictions: No  Short Term Goals: OT Short Term Goal 1: Pt will don UB clothing with setup OT Short Term Goal 2: Pt will don LB clothing with setup OT Short Term Goal 3: Pt will perform toileting with distant S OT Short Term Goal 4: Pt will perform bed mobility in prep for ADL task mod I OT Short Term Goal 5: Pt will demonstrated selective attention with min cuing during 30 min of functional activity  Skilled Therapeutic Interventions/Progress Updates:   Pt seen for ADL training of bathing and dressing at sink level with a focus on balance, attention and sequencing of tasks.   At the start of the session, pt stated he was feeling very aggravated and needed time to calm down and relax.  Pt self applied oxygen and stated that he definitely needed it.   After some time, pt was able to calm down and focus.  He did need min to mod cues to focus his attention on task.  Pt continued to not be oriented to time, place, situation.  Overall, pt only needed cues/ supervision  for basic ADL tasks and functional mobility, except for assist with his right sock.     General General Chart Reviewed: Yes  Pain Pain Assessment Pain Assessment: No/denies pain        Therapy/Group: Individual Therapy  SAGUIER,JULIA 10/10/2011, 11:23 AM

## 2011-10-10 NOTE — Progress Notes (Signed)
Patient Details  Name: [name] MRN: [id number] Date of Birth: Mar 28, 1932  Today's Date: 10/10/2011 Time: 14:00-14:23 Skilled Therapeutic Interventions/Progress Updates: Pt restless today and difficult to engage in tasks.  Attempted metal pipe tree task for problem solving,upper extremity use, and sustained attention. Pt did manipulate pieces in his hands, but was unable to perform the task given verbal instructions.  Pt was able verbally problem solve how to separate metal pieces using a vice and pliers, etc independently. Pt perserverating on "waiting til tomorrow" to actually complete task.  No c/o pain.    Therapy/Group: Individual Therapy  Activity Level: moderate  Level of assist: Min Assist  Javanni Maring 10/10/2011, 2:42 PM

## 2011-10-10 NOTE — Progress Notes (Signed)
Occupational Therapy Note  Patient Details  Name: Dedrick Heffner MRN: [id number] Date of Birth: 1932/09/22 Today's Date: 10/10/2011  Time: 1400-1430 ( - missed 30 min)  No c/o pain. 1:1 Cognitive retraining: Pt falling asleep during participation in functional task of taking metal pipes apart and reconstructing new figure.  Max to to total A to participate and attend to task. Pt disoriented and difficulty being reoriented with contextual cues.  Focused attention for seconds at a time.  Pt kept eyes closed for majority of session. Assisted pt back to bed where rested comfortably.  (pt had taken meds for anxiety prior to session )    Melonie Florida 10/10/2011, 2:41 PM

## 2011-10-10 NOTE — Progress Notes (Signed)
Per State Regulation 482.30 This chart was reviewed for medical necessity with respect to the patient's Admission/Duration of stay. Pt participating txs-progress limited by cognitive deficits.  Currently w/ need for someone at all times who can walk around w/ him, redirect him.  He does not sleep well--naps occ. Concern about wife being able to provide the assist pt will need.  Brock Ra                 Nurse Care Manager              Next Review Date: 09/14/11

## 2011-10-11 ENCOUNTER — Inpatient Hospital Stay (HOSPITAL_COMMUNITY): Payer: Medicare Other

## 2011-10-11 MED ORDER — QUETIAPINE FUMARATE 50 MG PO TABS
50.0000 mg | ORAL_TABLET | Freq: Every day | ORAL | Status: DC
  Administered 2011-10-11 – 2011-10-13 (×3): 50 mg via ORAL
  Filled 2011-10-11 (×4): qty 1

## 2011-10-11 NOTE — Progress Notes (Signed)
Late entry for 0225: patient walking about nurse's station, with sitter, refusing to sit down or go to his room.  Xanax 0.5 mg given   C. Pulliam, sitter, assists patient back to his room with minimal resistance after fifteen minutes.

## 2011-10-11 NOTE — Progress Notes (Addendum)
Occupational Therapy Note  Patient Details  Name: [name] MRN: [id number] Date of Birth: December 08, 1931 Today's Date: 10/11/2011  Time 9:30-1030 ( ) No c/o pain, just sob and fatigue with activity. Pt also with a productive (yellowish) cough- RN and MD aware.  1:1 self care retraining at shower level.  Focus on functional navigation around room to collect items for shower.  Pt on 2Liters of O2 through out session but continued to be SOB so bumped O2 up to 3 liters- RN made aware.  Focus on sequencing, orientation pieces, task organization, dynamic balance- one limb stance for washing feet, simple problem solving within functional contexts, intellectual awareness in the moment.  Laid down for collar pads to be changed and to be shaven while in supine. Pt much more calm today with moments of clarity and awareness but with increased fatigue.  Still reports of little sleeping at night.   Melonie Florida 10/11/2011, 1:33 PM

## 2011-10-11 NOTE — Progress Notes (Signed)
Physical Therapy Note  Patient Details  Name: [name] MRN: [id number] Date of Birth: September 05, 1932 Today's Date: 10/11/2011  Time:1415-1512 57 minutes  No c/o pain. Pt with more SOB and fatigue this PM. Required more frequent rest breaks.  Bowling game for attention to task, mobility with setting up pins, bowling ball.  Pt able to perform standing to/from floor to set up pins with close supervision.  Homemaking tasks for functional mobility, problem solving, balance, safety and activity tolerance.  Hanging clothes and making bed with close supervision, frequent cues for safety and sequencing.  Individual Therapy   Almir Botts 10/11/2011, 3:49 PM

## 2011-10-11 NOTE — Progress Notes (Signed)
Patient very cooperative this am. Patient easily redirected alert and oriented x1 . Patient noted to become more disoriented at 1700 and remained standing outside of room until 1830 until RN called patient's wife. c-collar intact no complaint of pain . o2 sat 91%  On room  Air at 1700 standing  Outside doorway . Patient remained continent today . Sitter required in room due to patient unaware of safety issues. Patient pleasantly confused this pm . Continue with plan of care .                                                                                                                                                Cleotilde Neer

## 2011-10-11 NOTE — Progress Notes (Signed)
Occupational Therapy Note  Patient Details  Name: [name] MRN: [id number] Date of Birth: 05-18-1932 Today's Date: 10/11/2011  Time: 1345-1415 ( ) No c/o pain  1:1 cognitive retraining with functional activity of decorating a Christmas tree. Focus on dynamic balance in standing, sustained to selective attention, monitoring his O2 tubing while walking around tree. Pt did well participating in task with distant supervision and min A for initiation and simple problem solving related to decorating.   Melonie Florida 10/11/2011, 2:46 PM

## 2011-10-11 NOTE — Progress Notes (Signed)
Patient Details  Name: [name] MRN: [id number] Date of Birth: 1932/10/31  Today's Dce: 10/11/2011 Time:8:30-9  Skilled Therapeutic Interventions/Progress Updates: Pt ambulated on the unit pushing his oxygen tank with supervision, min cues for safety with tubing.  Pt problem solved putting Christmas tree parts together with Min cues.  No c/o pain. Therapy/Group: Individual Therapy  Activity Level: Moderate:  Level of assist: Supervision  [name] 10/11/2011, 4:35 PM
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
## 2011-10-11 NOTE — Progress Notes (Signed)
Physical Therapy Note  Patient Details  Name: [name] MRN: [id number] Date of Birth: 18-Feb-1932 Today's Date: 10/11/2011  Time: 814-911 57 minutes  No c/o pain. C/o shortness of breath with activity, pt on 2L02 [place], dropped to 88% with prolonged gait, required 30-60 seconds seated deep breathing to return to >90%.  Session focused on sustained attention tasks and problem solving with building metal pipe tree and assisting to assemble christmas tree. Pt with good sustained attention to pipe tree task, 2-3 minutes without cues. Pt required frequent cuing for attention to task with christmas tree assembly including gathering supplies and finding proper pieces.  Pt attention decreases with fatigue.  Pt with one LOB during gait requiring min A to correct.  Pt continues with decreased attention to tasks requiring frequent cues.  Pt demonstrates decreased problem solving and requires max-total assist for following directions for activities.  Individual therapy   [name] 10/11/2011, 9:17 AM

## 2011-10-11 NOTE — Progress Notes (Signed)
Progress Notes  Speech Language Pathology Therapy Note  Patient Details  Name: [name] MRN: [id number] Date of Birth: 1931-11-28  Today's Date: 10/11/2011 Time: 1130-1230 Time Calculation (min): 60 min  Precautions: Precautions Precautions: Fall Precaution Comments: falls, awareness, safety attention  Required Braces or Orthoses: Yes Cervical Brace: Applied in supine position;Hard collar Restrictions Weight Bearing Restrictions: No  Skilled Therapeutic Interventions: Treatment focus on functional problem solving, sustained attention and swallowing safety with regular textures and thin liquids with utilization of swallowing strategies. Min A verbal cues to utilize small bites during meal. Min A verbal and vision cues for set-up of tray. Pt oriented to situation independently with Mod A verbal and question cues to orient to place and date with use of external aids.  Pt demonstrated perseveration on locating wife and asked to make 2 phone calls to family members. Grandson present during treatment and pt able to recall information in regards to family independently.   Vital Signs Oxygen Therapy SpO2: 98 % O2 Device: None (Room air);Nasal cannula O2 Flow Rate (L/min): 2 L/min Pain Pain Assessment Pain Assessment: No/denies pain Pain Score: 0-No pain  Therapy/Group: Individual Therapy  Adetokunbo Mccadden 10/11/2011 3:30 PM

## 2011-10-11 NOTE — Progress Notes (Signed)
Patient ID: [name], male   DOB: 1932-06-21, 77 y.o.   MRN: [id number] Patient ID: [name], male   DOB: 05/01/1932, 77 y.o.   MRN: [id number] Patient ID: [name], male   DOB: [birth year], 77 y.o.   MRN: [id number] Patient ID: [name], male   DOB: Mar 12, 1932, 77 y.o.   MRN: [id number] Subjective/Complaints: Review of Systems  Psychiatric/Behavioral: Positive for memory loss. The patient has insomnia.   All other systems reviewed and are negative.   Confused. Non-agitated per staff.  Poor sleep   Objective: Vital Signs: Blood pressure 160/72, pulse 101, temperature 97.4 F (36.3 C), temperature source Oral, resp. rate 18, height 5\' 11"  (1.803 m), weight 69.088 kg (152 lb 5 oz), SpO2 92.00%. No results found. No results found for this basename: WBC:2,HGB:2,HCT:2,PLT:2 in the last 72 hours No results found for this basename: NA:2,K:2,CL:2,CO2:2,GLUCOSE:2,BUN:2,CREATININE:2,CALCIUM:2 in the last 72 hours CBG (last 3)  No results found for this basename: GLUCAP:3 in the last 72 hours  Wt Readings from Last 3 Encounters:  10/05/11 69.088 kg (152 lb 5 oz)  09/28/11 74.844 kg (165 lb)  05/20/11 74.39 kg (164 lb)    Physical Exam:  General appearance: alert, no distress and slowed mentation Head: multiple bruises along left side of face Eyes: conjunctivae/corneas clear. PERRL, EOM's intact. Fundi benign. Ears: normal TM's and external ear canals both ears Nose: Nares normal. Septum midline. Mucosa normal. No drainage or sinus tenderness. Throat: lips, mucosa, and tongue normal; teeth and gums normal Neck: no adenopathy, no carotid bruit, no JVD, supple, symmetrical, trachea midline and thyroid not enlarged, symmetric, no tenderness/mass/nodules Back: symmetric, no curvature. ROM normal. No CVA tenderness. Resp: clear to auscultation bilaterally Cardio: regular rate and rhythm, S1, S2 normal, no murmur, click, rub or gallop GI: soft, non-tender; bowel sounds normal; no masses,  no  organomegaly Extremities: extremities normal, atraumatic, no cyanosis or edema Pulses: 2+ and symmetric Skin: Skin color, texture, turgor normal. No rashes or lesions Neurologic: pt is confused.  Oriented to name only.  Can follow simple commands.  No insight or awareness.  Exam non focal during my exam while pt seated.   Orientation to person only    Assessment/Plan: 1. Functional deficits secondary to TBI and transverse process fracture C7/T1  which require 3+ hours per day of interdisciplinary therapy in a comprehensive inpatient rehab setting. Physiatrist is providing close team supervision and 24 hour management of active medical problems listed below. Physiatrist and rehab team continue to assess barriers to discharge/monitor patient progress toward functional and medical goals.  Poor day/night schedule. Sleep during day.  Mobility: Bed Mobility Bed Mobility: Yes Right Sidelying to Sit: 3: Mod assist Right Sidelying to Sit Details (indicate cue type and reason): c/o dizziness with laying down Transfers Transfers: Yes Stand Pivot Transfers - DO NOT USE: 4: Min assist Stand Pivot Transfer Details (indicate cue type and reason): steady A for balance and attention to functional task Ambulation/Gait Ambulation/Gait Assistance: 5: Supervision Ambulation/Gait Assistance Details (indicate cue type and reason): steadying assist for balance, slight LOB with turns, backward gait Ambulation Distance (Feet): 200 Feet Assistive device:  (no AD) Gait Pattern: Decreased step length - right;Decreased step length - left Gait velocity: slow, pt wanting to hold handrail Stairs: No Stairs Assistance: 4: Min assist Stairs Assistance Details (indicate cue type and reason): cues for foot placement on stairs, cues for safety Stair Management Technique: Two rails Number of Stairs: 5  Wheelchair Mobility Wheelchair Mobility: No ADL:   Cognition:  Cognition Overall Cognitive Status:  Impaired Arousal/Alertness: Awake/alert Orientation Level: Oriented to person;Disoriented to place;Disoriented to time;Disoriented to situation Attention: Alternating Focused Attention: Appears intact Focused Attention Impairment: Verbal basic;Functional basic Sustained Attention: Impaired Sustained Attention Impairment: Verbal complex Selective Attention: Impaired Selective Attention Impairment: Verbal complex Alternating Attention: Impaired Alternating Attention Impairment: Verbal complex;Functional basic Divided Attention: Impaired Divided Attention Impairment: Functional basic;Verbal basic;Verbal complex;Functional complex Memory: Impaired Memory Impairment: Storage deficit;Retrieval deficit;Decreased recall of new information;Decreased short term memory;Prospective memory Decreased Short Term Memory: Verbal basic;Functional basic Awareness: Impaired Awareness Impairment: Intellectual impairment Problem Solving: Impaired Problem Solving Impairment: Verbal complex;Functional basic;Functional complex Executive Function: Reasoning;Sequencing;Organizing;Decision Making;Self Correcting;Self Monitoring Reasoning: Impaired Reasoning Impairment: Verbal basic;Verbal complex Sequencing: Impaired Sequencing Impairment: Functional basic Organizing: Impaired Organizing Impairment: Verbal complex;Verbal basic;Functional basic;Functional complex Decision Making: Impaired Decision Making Impairment: Verbal basic;Verbal complex;Functional complex Initiating: Appears intact Initiating Impairment: Verbal basic;Functional basic Self Monitoring: Impaired Self Monitoring Impairment: Verbal basic;Functional basic Self Correcting: Impaired Self Correcting Impairment: Verbal complex;Functional complex;Functional basic Behaviors: Perseveration;Confabulation;Impulsive Safety/Judgment: Impaired Comments: performed dynamic balance focusing on alternating attention naming cars and catching a ball while  walking min assist for mobility mod assist for attention [facility] Scales of Cognitive Functioning: Confused/inappropriate/non-agitated Cognition Arousal/Alertness: Awake/alert Orientation Level: Oriented to person;Disoriented to place;Disoriented to time;Disoriented to situation   1.TBI with transverse process fracture C7/T1-PT/OT/SLP eval and treat.Cervical collar x 6 weeks 1. DVT Prophylaxis/Anticoagulation: S.Q lovenox-Monitor platelet counts and any bleeding episodes.Follow up labs today.  Resumed ASA 2. Pain Management: Norco prn.Monitor mental status   3. Mood/sleep:  Still sundowning and sleep is poor.Ophelia Charter currently at bedside.Discontinue haldol.Continue xanax/ativan prn.may need scheduled med for restlessness.Patient on Xanax 0.5mg  daily PTA for H/O anxiety. There is a report of premorbid dementia, but given his functional history I would tend to downplay such reports.   -Titrate seroquel to 50-100mg   -Check U/A C and S-Negativ 4.Tobacco abuse/COPD-Taper nocoderm patch.Continue proventil/combivent.Check oxygen sat every shift.   5.HTN-Currently on no present meds.Blood pressure with some increased variables.Patient on HCTZ 12.5 daily and lopressor 25mg   HS prior to admission. Noted H/O STEMI.  BP's not consistently high enough to make standing changes.     LOS (Days) 6  SWARTZ,ZACHARY T 10/11/2011, 8:54 AM

## 2011-10-12 DIAGNOSIS — F0390 Unspecified dementia without behavioral disturbance: Secondary | ICD-10-CM

## 2011-10-12 DIAGNOSIS — S069X9A Unspecified intracranial injury with loss of consciousness of unspecified duration, initial encounter: Secondary | ICD-10-CM

## 2011-10-12 DIAGNOSIS — Z5189 Encounter for other specified aftercare: Secondary | ICD-10-CM

## 2011-10-12 NOTE — Progress Notes (Signed)
Per State Regulation 482.30 This chart was reviewed for medical necessity with respect to the patient's Admission/Duration of stay. Pt participating txs.  Continues needing  cues & 1:1 supervision d/t cognitive deficits.  Per report slept somewhat better last night.  Spoke w/ pt's wife yesterday evening about Team Conference report:  need for cues & supervision, working to improve sleep, rechecking chest xray.  Wife is very concerned about being able to provide at home the care pt needs.  No d/c date set -- pt to be reviewed end of week.   Brock Ra                 Nurse Care Manager              Next Review Date: 09/14/11

## 2011-10-12 NOTE — Progress Notes (Signed)
Speech Language Pathology Therapy Note  Patient Details  Name: [name] MRN: [id number] Date of Birth: 1932-03-22  Today's Date: 10/12/2011 Time: 1130-1230 Time Calculation (min): 60 min  Precautions: Precautions Precautions: Fall Precaution Comments: falls, awareness, safety attention  Required Braces or Orthoses: Yes Cervical Brace: Applied in supine position;Hard collar Restrictions Weight Bearing Restrictions: No  Pain: No/Denies Pryor Ochoa  Skilled Therapeutic Interventions: Treatment focus on utilization of swallowing strategies with regular textures and thin liquids. Pt without overt s/s of aspiration but needed supervision verbal cues for small bites and slow pace.  Oriented to place with Mod contextual cues and to time with Max verbal and visual cues. Independently oriented to situation. Demonstrated decreased safety awareness with getting up without assistance during session. Selective attention to task for ~ 20 mins with min verbal cues for redirection. Taught clinician how to play card game with Min A verbal and question cues for organization, problem solving and recall of information.    Therapy/Group: Individual Therapy  Bisma Klett 10/12/2011 1:35 PM

## 2011-10-12 NOTE — Progress Notes (Signed)
Patient ID: [name], male   DOB: 1932/06/25, 77 y.o.   MRN: [id number] Patient ID: [name], male   DOB: 1932/07/12, 77 y.o.   MRN: [id number] Patient ID: [name], male   DOB: 1932/07/24, 77 y.o.   MRN: [id number] Patient ID: [name], male   DOB: [birth year], 77 y.o.   MRN: [id number] Patient ID: [name], male   DOB: October 25, 1932, 77 y.o.   MRN: [id number] Subjective/Complaints: Review of Systems  Psychiatric/Behavioral: Positive for memory loss. The patient has insomnia.   All other systems reviewed and are negative.  More appropriate yesterday.  Slept much better last night.   Objective: Vital Signs: Blood pressure 159/70, pulse 82, temperature 98.2 F (36.8 C), temperature source Oral, resp. rate 18, height 5\' 11"  (1.803 m), weight 69.088 kg (152 lb 5 oz), SpO2 90.00%. Dg Chest 2 View  10/11/2011  *RADIOLOGY REPORT*  Clinical Data: Cough and short of breath  CHEST - 2 VIEW  Comparison: CT 09/28/2011.  Chest x-ray 09/28/2011  Findings: Chronic volume loss and scarring throughout the left lung with bronchiectasis.  Extensive pleural thickening on the left. Tubular densities in the left lung base likely represent fluid filled dilated bronchi which may be infected.  Right lung is clear without infiltrate or effusion.  Mild right lower lobe bronchiectasis.  No pneumothorax or effusion.  IMPRESSION: Severe chronic scarring throughout the left lung with extensive bronchiectasis, left greater than right. Tubular densities in the left lung base likely represent mucus filled dilated bronchi which were not present 09/28/2011.  Original Report Authenticated By: Camelia Phenes, M.D.   No results found for this basename: WBC:2,HGB:2,HCT:2,PLT:2 in the last 72 hours No results found for this basename: NA:2,K:2,CL:2,CO2:2,GLUCOSE:2,BUN:2,CREATININE:2,CALCIUM:2 in the last 72 hours CBG (last 3)  No results found for this basename: GLUCAP:3 in the last 72 hours  Wt Readings from Last 3 Encounters:    10/05/11 69.088 kg (152 lb 5 oz)  09/28/11 74.844 kg (165 lb)  05/20/11 74.39 kg (164 lb)    Physical Exam:  General appearance: alert, no distress and slowed mentation Head: multiple bruises along left side of face Eyes: conjunctivae/corneas clear. PERRL, EOM's intact. Fundi benign. Ears: normal TM's and external ear canals both ears Nose: Nares normal. Septum midline. Mucosa normal. No drainage or sinus tenderness. Throat: lips, mucosa, and tongue normal; teeth and gums normal Neck: no adenopathy, no carotid bruit, no JVD, supple, symmetrical, trachea midline and thyroid not enlarged, symmetric, no tenderness/mass/nodules Back: symmetric, no curvature. ROM normal. No CVA tenderness. Resp: Decreased sounds/ronchi/rales/wheezes in entire left lung field. Cardio: regular rate and rhythm, S1, S2 normal, no murmur, click, rub or gallop GI: soft, non-tender; bowel sounds normal; no masses,  no organomegaly Extremities: extremities normal, atraumatic, no cyanosis or edema Pulses: 2+ and symmetric Skin: Skin color, texture, turgor normal. No rashes or lesions Neurologic: More alert and appropriate today.  Oriented to name only.  Can follow simple commands.  No insight or awareness.  Exam non focal during my exam while pt seated.       Assessment/Plan: 1. Functional deficits secondary to TBI and transverse process fracture C7/T1  which require 3+ hours per day of interdisciplinary therapy in a comprehensive inpatient rehab setting. Physiatrist is providing close team supervision and 24 hour management of active medical problems listed below. Physiatrist and rehab team continue to assess barriers to discharge/monitor patient progress toward functional and medical goals.  Optimizing sleep and cognition to prepare him for d/c home with wife.  Mobility: Bed Mobility Bed Mobility: Yes Right Sidelying to Sit: 3: Mod assist Right Sidelying to Sit Details (indicate cue type and reason): c/o  dizziness with laying down Transfers Transfers: Yes Stand Pivot Transfers - DO NOT USE: 4: Min assist Stand Pivot Transfer Details (indicate cue type and reason): steady A for balance and attention to functional task Ambulation/Gait Ambulation/Gait Assistance: 5: Supervision Ambulation/Gait Assistance Details (indicate cue type and reason): steadying assist for balance, slight LOB with turns, backward gait Ambulation Distance (Feet): 200 Feet Assistive device:  (no AD) Gait Pattern: Decreased step length - right;Decreased step length - left Gait velocity: slow, pt wanting to hold handrail Stairs: No Stairs Assistance: 4: Min assist Stairs Assistance Details (indicate cue type and reason): cues for foot placement on stairs, cues for safety Stair Management Technique: Two rails Number of Stairs: 5  Wheelchair Mobility Wheelchair Mobility: No ADL:   Cognition: Cognition Overall Cognitive Status: Impaired Arousal/Alertness: Awake/alert Orientation Level: Oriented to person;Disoriented to time;Disoriented to situation Attention: Alternating Focused Attention: Appears intact Focused Attention Impairment: Verbal basic;Functional basic Sustained Attention: Impaired Sustained Attention Impairment: Verbal complex Selective Attention: Impaired Selective Attention Impairment: Verbal complex Alternating Attention: Impaired Alternating Attention Impairment: Verbal complex;Functional basic Divided Attention: Impaired Divided Attention Impairment: Functional basic;Verbal basic;Verbal complex;Functional complex Memory: Impaired Memory Impairment: Storage deficit;Retrieval deficit;Decreased recall of new information;Decreased short term memory;Prospective memory Decreased Short Term Memory: Verbal basic;Functional basic Awareness: Impaired Awareness Impairment: Intellectual impairment Problem Solving: Impaired Problem Solving Impairment: Verbal complex;Functional basic;Functional  complex Executive Function: Reasoning;Sequencing;Organizing;Decision Making;Self Correcting;Self Monitoring Reasoning: Impaired Reasoning Impairment: Verbal basic;Verbal complex Sequencing: Impaired Sequencing Impairment: Functional basic Organizing: Impaired Organizing Impairment: Verbal complex;Verbal basic;Functional basic;Functional complex Decision Making: Impaired Decision Making Impairment: Verbal basic;Verbal complex;Functional complex Initiating: Appears intact Initiating Impairment: Verbal basic;Functional basic Self Monitoring: Impaired Self Monitoring Impairment: Verbal basic;Functional basic Self Correcting: Impaired Self Correcting Impairment: Verbal complex;Functional complex;Functional basic Behaviors: Perseveration;Confabulation;Impulsive Safety/Judgment: Impaired Comments: performed dynamic balance focusing on alternating attention naming cars and catching a ball while walking min assist for mobility mod assist for attention [facility] Scales of Cognitive Functioning: Confused/inappropriate/non-agitated Cognition Arousal/Alertness: Awake/alert Orientation Level: Oriented to person;Disoriented to time;Disoriented to situation   1.TBI with transverse process fracture C7/T1-PT/OT/SLP eval and treat.Cervical collar x 6 weeks 1. DVT Prophylaxis/Anticoagulation: S.Q lovenox-Monitor platelet counts and any bleeding episodes.Follow up labs ok. Resumed ASA 2. Pain Management: Norco prn.Monitor mental status   3. Mood/sleep:  Still sundowning but sleep better yesterday.Ophelia Charter currently at bedside.Discontinue haldol.Continue xanax/ativan prn.may need scheduled med for restlessness.Patient on Xanax 0.5mg  daily PTA for H/O anxiety. There is a report of premorbid dementia, and after speaking with team/family this is likely playing a role here as well.  -Seroquel increase effective.  Continue for now  -May need to sleep in chair, as it sounds like he did that home and  doesn't like to lay down or have feet up! 4.Tobacco abuse/COPD-Taper nocoderm patch.Continue proventil/combivent.O2. See below.  5.HTN-Currently on no present meds.Blood pressure with some increased variables.Patient on HCTZ 12.5 daily and lopressor 25mg   HS prior to admission. Noted H/O STEMI.  BP's not consistently high enough to make standing changes.  6. Increased cough and secretions noted by staff.  CXR show bronchiectasis and ? Fluid/mucous filled pockets in bronchii.   I question really how much of this is new.  He has an extensive history of collapse on the left.  Will add mucinex. Continue O2 per his baseline. F/u with wife regarding prior pulmonary MD care.  LOS (Days) 7  FACE To FACE VISIT PEFORMED  Jalien Weakland T 10/12/2011, 7:12 AM

## 2011-10-12 NOTE — Progress Notes (Signed)
Occupational Therapy Note  Patient Details  Name: Jaydrian Corpening MRN: [id number] Date of Birth: 07/14/1932 Today's Date: 10/12/2011  Time: 10-11 ( ) No c/o pain   1:1 session: self care training at sink level (pt's choice). Focus on sequencing with contextual and environmental cues, organization of task with min A, pt needed mod cuing for perseveration of washing body parts, simple problem solving with familiar tasks with min A, navigation around unit with max cuing.  able to dress self with supervision- cuing only to sit and rest when dyspenia 3/4 (labored breathing).  Pt needs encouragement to sit and rest.  O2 on 2-3 liters.   Melonie Florida 10/12/2011, 3:08 PM

## 2011-10-12 NOTE — Progress Notes (Signed)
Occupational Therapy Note  Patient Details  Name: Johnryan Sao MRN: [id number] Date of Birth: 12-20-1931 Today's Date: 10/12/2011  Time:1505 - 1550 45 min  Pain - no c/o.  Skilled Intervention: Focus of session on sustained and selective attention, sequencing and organization and information in problem solving activity. Mod cues for intellectual awareness at times...Marland Kitchenorientation to place and time. Required mod questioning cues to organize baking goods in cabinet in kitchen. Mod cues to orient pt to finding destination and stay on task. Requiring frequent rest breaks. Excellent participation.  Arti Trang,HILLARY 10/12/2011, 4:21 PM

## 2011-10-12 NOTE — Progress Notes (Signed)
Patient is alert to self today. Requires sitter in room at all times. Easily distracted and needs cues for activity, medication administration, and meals. Requires assistance with meal setup and supervision with meals. Takes pills whole in water with a regular diet. Voids in BR toilet. Walks with hand held assist to supervision. Wears aspen collar at all times. Bruising to left side of face as well as right eye and cheek. Right scapula abrasion with allevyn dressing clean dry and intact. Nicotine patch left deltoid. No complaints of pain today. Continue with plan of care.

## 2011-10-12 NOTE — Progress Notes (Signed)
Physical Therapy Note  Patient Details  Name: [name] MRN: [id number] Date of Birth: 15-Oct-1932 Today's Date: 10/12/2011  Time:815-915 60 minutes  No c/o pain.  Attention, problem solving, motor planning activity with finding vending machines, choosing drink and making purchase with correct change.  Pt required mod cues for attention to task, max cues for memory of instructions, min cuing for counting change correctly, supervision for choosing correct direction on elevator, total assist to use signs to find elevators. Pt amb with supervision >300' with frequent standing rests due to pt c/o shortness of breath, O2 95% on 2LO2.  Horseshoe game with pt to keep score for balance, mobility and attention.  Pt required total assist to add score ? Due to fatigue.  Card game with simple math for attention and simple math skills with min-mod questioning cues to recall rules of game.  Pt with overall better insight into condition today, continues with impaired safety awareness, attention and problem solving.  Individual therapy   Daryll Spisak 10/12/2011, 10:49 AM

## 2011-10-12 NOTE — Progress Notes (Signed)
Physical Therapy Note  Patient Details  Name: [name] MRN: [id number] Date of Birth: [birth year] Today's Date: 10/12/2011  Time: 1300-1342 42 minutes  No c/o pain.  Attention, problem solving, following directions with pipe tree task.  Pt required frequent cues to use picture as guide for building tree.  Scavenger hunt task with pt requiring mod questioning cues to recall what he was searching for.  Pt with "tightness" in lungs. RN aware. Pt required more frequent seated rests this PM.  Cleaning task for household mobility and balance as well as organization and sequencing.  Pt required steadying assist for balance when reaching high and low items.  Individual therapy   Delphia Kaylor 10/12/2011, 3:26 PM

## 2011-10-13 NOTE — Progress Notes (Signed)
Occupational Therapy Note  Patient Details  Name: [name] MRN: [id number] Date of Birth: 14-Jan-1932 Today's Date: 10/13/2011 Time: 1130 - 1145 15 min Group [employer]  Skilled Intervention: Focus on functional problem solving, emergent awareness, socialization and functional use LUE. Good intake. Minimal redirection needed. Using LUE as nondominant.  Group session   Vamsi Apfel,HILLARY 10/13/2011, 2:41 PM

## 2011-10-13 NOTE — Progress Notes (Signed)
Occupational Therapy Session Note  Patient Details  Name: Jasmine Maceachern MRN: [id number] Date of Birth: [birth year]  Today's Date: 10/13/2011 Time: 9147-8295 Time Calculation (min): 47 min  Pain:  Patient holds right anterior chest wall after coughing episode.  Precautions: Precautions Precautions: Fall Precaution Comments: falls, awareness, safety attention  Required Braces or Orthoses: Yes Cervical Brace: Applied in supine position;Hard collar Restrictions Weight Bearing Restrictions: No   Skilled Therapeutic Interventions/Progress Updates:    OT ADL this am to address selective attention, sequencing, oreintation, awareness during basic self care task.  Patient with increased dyspnea today.  Unable to remove O2 for even brief periods without significant symptoms of shortness of breath,color change, light headedness. Dynamic stand balance with supervision.  Therapy/Group: Individual Therapy  [name] 10/13/2011, 9:19 AM

## 2011-10-13 NOTE — Progress Notes (Signed)
Occupational Therapy Note  Patient Details  Name: [name] MRN: [id number] Date of Birth: [birth year] Today's Date: 10/13/2011  Time 13:00-14:15 ( ) No c/o pain  1:1 Therapeutic Activity of assembling a christmas tree: focus on increasing activity with functional ambulation and standing tolerance, following one step directions, 2 step directions with mod A, selective attention in a min distracting environment with min A, maintaining O2 stats above 90% on 3 liters with activity, sequencing and simple problem solving.  Pt with increased SOB today desating  (>88%) quickly with minimal activity requiring rest breaks.  Pt  Required mod encouragement to sit and take a rest.   Melonie Florida 10/13/2011, 3:00 PM

## 2011-10-13 NOTE — Progress Notes (Signed)
Patient tolerating therapy patient noted sleeping at times while up in recliner. Patient refused to go to bed .  o2 sat at 91% on o2 at 2l/min [place] . Patient reported feeling more short of breath ambulating . o2 sat at 90-91% with activity with oxygen . Patient continues to require assist to recall safety awareness. Continent bowel and bladder lbm 12-6 moderate formed. Patient reports he wants to call wife to bring him clothes and shoes so he can leave.  Co-collar intact patient denies pain. Continue sitter until increased safety awareness noted. Continue with plan of care.                                                                                                                                      Cleotilde Neer

## 2011-10-13 NOTE — Progress Notes (Signed)
Physical Therapy Weekly Progress Note  Patient Details  Name: [name] MRN: [id number] Date of Birth: 12-08-1931  Today's Date: 10/13/2011 Time: 0730-0827 Time Calculation (min): 57 min  Patient has met 2 of 3 short term goals.  He has progressed to supervision level with mobility and balance.  Pt has improved focused attention and is able to sustain attention to task 2 minutes at a time without cues.  Pt has increased orientation and is able to verbalize reason for being in rehab.  Patient continues to demonstrate the following deficits: decreased balance reactions, decreased activity tolerance, decreased selective attention, decreased safety awareness, impaired memory and therefore will continue to benefit from skilled PT intervention to enhance overall performance with activity tolerance, balance, ability to compensate for deficits, attention, awareness, coordination and knowledge of precautions.  Patient progressing toward long term goals..  Continue plan of care.  PT Short Term Goals PT Short Term Goal 1: Pt will perform functional transfers with supervision PT Short Term Goal 1 - Progress: Met PT Short Term Goal 2: Pt will gait in controlled environment 150' wtih supervision PT Short Term Goal 2 - Progress: Met PT Short Term Goal 3: pt will demo emergent awareness with min A for functional tasks PT Short Term Goal 3 - Progress: Progressing toward goal PT Short Term Goal 4: pt will demonastrate dynamic standing balance for functional taks with supervision PT Short Term Goal 5: pt will demo selective attention to functional task x 5 minutes with supervision  Therapy Documentation Precautions: Precautions Precautions: Fall Precaution Comments: falls, awareness, safety attention  Required Braces or Orthoses: Yes Cervical Brace: Applied in supine position;Hard collar Restrictions Weight Bearing Restrictions: No   Pain Pain Assessment Pain Assessment: No/denies pain Pain Score:  0-No pain Mobility Transfers Sit to Stand: 5: Supervision Locomotion  Ambulation Ambulation/Gait Assistance: 5: Supervision Ambulation Distance (Feet): 200 Feet Ambulation/Gait Assistance Details (indicate cue type and reason): requires standing rest breaks due to fatigue Wheelchair Mobility Wheelchair Mobility: No  Balance Static Standing Balance Static Standing - Level of Assistance: 5: Stand by assistance Dynamic Standing Balance Dynamic Standing - Level of Assistance: 5: Stand by assistance  PT treatment Functional kitchen mobility to prepare oatmeal in microwave with min cues for sequencing and safety.  Prepare coffee with min questioning cues.  Direction finding with mod cues for following and locating signs. Pt continues to demo decreased activity tolerance and requires more frequent rest breaks with ambulation and standing.  Unable to stand greater than 2-3 minutes today without seated rest.  Attention activity with card game, pt with improved sustained attention to task.  Min cues for recall of rules of game.  Therapy/Group: Individual Therapy  India Jolin 10/13/2011, 11:50 AM

## 2011-10-13 NOTE — Progress Notes (Signed)
Speech Language Pathology Therapy Note [facility] Note  Patient Details  Name: [name] MRN: [id number] Date of Birth: 1931/12/26  Today's Date: 10/13/2011 Time: 7829-5621 Time Calculation (min): 15 min  Precautions: Precautions Precautions: Fall Precaution Comments: falls, awareness, safety attention  Required Braces or Orthoses: Yes Cervical Brace: Applied in supine position;Hard collar Restrictions Weight Bearing Restrictions: No   Skilled Therapeutic Interventions: Treatment focus on utilization of swallowing strategies with small bites and slow pace. Pt needed supervision cues throughout meal but did not demonstrate no overt s/s of aspiration. Min A verbal and tactile cues to utilize LUE and for redirection to meal in moderately distracting environment.    Therapy/Group: Dysphagia Group  [name] 10/13/2011 4:06 PM

## 2011-10-13 NOTE — Progress Notes (Signed)
Patient ID: [name], male   DOB: [birth year], 77 y.o.   MRN: [id number] Patient ID: [name], male   DOB: 1932/03/28, 77 y.o.   MRN: [id number] Patient ID: [name], male   DOB: May 31, 1932, 77 y.o.   MRN: [id number] Patient ID: [name], male   DOB: 1931-12-28, 77 y.o.   MRN: [id number] Patient ID: [name], male   DOB: [birth year], 77 y.o.   MRN: [id number] Patient ID: [name], male   DOB: [birth year], 77 y.o.   MRN: [id number] Subjective/Complaints: Review of Systems  Psychiatric/Behavioral: Positive for memory loss. The patient has insomnia.   All other systems reviewed and are negative.  More appropriate yesterday.  Slept even better last night per rn, sleeping in bed even to a degree.   Objective: Vital Signs: Blood pressure 135/84, pulse 96, temperature 97.7 F (36.5 C), temperature source Oral, resp. rate 22, height 5\' 11"  (1.803 m), weight 70.6 kg (155 lb 10.3 oz), SpO2 97.00%. Dg Chest 2 View  10/11/2011  *RADIOLOGY REPORT*  Clinical Data: Cough and short of breath  CHEST - 2 VIEW  Comparison: CT 09/28/2011.  Chest x-ray 09/28/2011  Findings: Chronic volume loss and scarring throughout the left lung with bronchiectasis.  Extensive pleural thickening on the left. Tubular densities in the left lung base likely represent fluid filled dilated bronchi which may be infected.  Right lung is clear without infiltrate or effusion.  Mild right lower lobe bronchiectasis.  No pneumothorax or effusion.  IMPRESSION: Severe chronic scarring throughout the left lung with extensive bronchiectasis, left greater than right. Tubular densities in the left lung base likely represent mucus filled dilated bronchi which were not present 09/28/2011.  Original Report Authenticated By: Camelia Phenes, M.D.   No results found for this basename: WBC:2,HGB:2,HCT:2,PLT:2 in the last 72 hours No results found for this basename: NA:2,K:2,CL:2,CO2:2,GLUCOSE:2,BUN:2,CREATININE:2,CALCIUM:2 in the last 72 hours CBG  (last 3)  No results found for this basename: GLUCAP:3 in the last 72 hours  Wt Readings from Last 3 Encounters:  10/12/11 70.6 kg (155 lb 10.3 oz)  09/28/11 74.844 kg (165 lb)  05/20/11 74.39 kg (164 lb)    Physical Exam:  General appearance: alert, no distress and slowed mentation Head: multiple bruises along left side of face Eyes: conjunctivae/corneas clear. PERRL, EOM's intact. Fundi benign. Ears: normal TM's and external ear canals both ears Nose: Nares normal. Septum midline. Mucosa normal. No drainage or sinus tenderness. Throat: lips, mucosa, and tongue normal; teeth and gums normal Neck: no adenopathy, no carotid bruit, no JVD, supple, symmetrical, trachea midline and thyroid not enlarged, symmetric, no tenderness/mass/nodules Back: symmetric, no curvature. ROM normal. No CVA tenderness. Resp: Decreased sounds/ronchi/rales/wheezes in entire left lung field. Cardio: regular rate and rhythm, S1, S2 normal, no murmur, click, rub or gallop GI: soft, non-tender; bowel sounds normal; no masses,  no organomegaly Extremities: extremities normal, atraumatic, no cyanosis or edema Pulses: 2+ and symmetric Skin: Skin color, texture, turgor normal. No rashes or lesions Neurologic: More alert and appropriate today.  Oriented to name only.  Can follow simple commands.  No insight or awareness.  Exam non focal during my exam while pt seated.       Assessment/Plan: 1. Functional deficits secondary to TBI and transverse process fracture C7/T1  which require 3+ hours per day of interdisciplinary therapy in a comprehensive inpatient rehab setting. Physiatrist is providing close team supervision and 24 hour management of active medical problems listed below. Physiatrist and rehab team continue to assess barriers  to discharge/monitor patient progress toward functional and medical goals.  Optimizing sleep and cognition to prepare him for d/c home with wife. Family ed needed.  Mobility: Bed  Mobility Bed Mobility: Yes Right Sidelying to Sit: 3: Mod assist Right Sidelying to Sit Details (indicate cue type and reason): c/o dizziness with laying down Transfers Transfers: Yes Stand Pivot Transfers - DO NOT USE: 4: Min assist Stand Pivot Transfer Details (indicate cue type and reason): steady A for balance and attention to functional task Ambulation/Gait Ambulation/Gait Assistance: 5: Supervision Ambulation/Gait Assistance Details (indicate cue type and reason): steadying assist for balance, slight LOB with turns, backward gait Ambulation Distance (Feet): 200 Feet Assistive device:  (no AD) Gait Pattern: Decreased step length - right;Decreased step length - left Gait velocity: slow, pt wanting to hold handrail Stairs: No Stairs Assistance: 4: Min assist Stairs Assistance Details (indicate cue type and reason): cues for foot placement on stairs, cues for safety Stair Management Technique: Two rails Number of Stairs: 5  Wheelchair Mobility Wheelchair Mobility: No ADL:   Cognition: Cognition Overall Cognitive Status: Impaired Arousal/Alertness: Awake/alert Orientation Level: Oriented to person;Disoriented to time;Disoriented to situation Attention: Alternating Focused Attention: Appears intact Focused Attention Impairment: Verbal basic;Functional basic Sustained Attention: Impaired Sustained Attention Impairment: Verbal complex Selective Attention: Impaired Selective Attention Impairment: Verbal complex Alternating Attention: Impaired Alternating Attention Impairment: Verbal complex;Functional basic Divided Attention: Impaired Divided Attention Impairment: Functional basic;Verbal basic;Verbal complex;Functional complex Memory: Impaired Memory Impairment: Storage deficit;Retrieval deficit;Decreased recall of new information;Decreased short term memory;Prospective memory Decreased Short Term Memory: Verbal basic;Functional basic Awareness: Impaired Awareness Impairment:  Intellectual impairment Problem Solving: Impaired Problem Solving Impairment: Verbal complex;Functional basic;Functional complex Executive Function: Reasoning;Sequencing;Organizing;Decision Making;Self Correcting;Self Monitoring Reasoning: Impaired Reasoning Impairment: Verbal basic;Verbal complex Sequencing: Impaired Sequencing Impairment: Functional basic Organizing: Impaired Organizing Impairment: Verbal complex;Verbal basic;Functional basic;Functional complex Decision Making: Impaired Decision Making Impairment: Verbal basic;Verbal complex;Functional complex Initiating: Appears intact Initiating Impairment: Verbal basic;Functional basic Self Monitoring: Impaired Self Monitoring Impairment: Verbal basic;Functional basic Self Correcting: Impaired Self Correcting Impairment: Verbal complex;Functional complex;Functional basic Behaviors: Perseveration;Confabulation;Impulsive Safety/Judgment: Impaired Comments: performed dynamic balance focusing on alternating attention naming cars and catching a ball while walking min assist for mobility mod assist for attention [facility] Scales of Cognitive Functioning: Confused/inappropriate/non-agitated Cognition Arousal/Alertness: Awake/alert Orientation Level: Oriented to person;Disoriented to time;Disoriented to situation   1.TBI with transverse process fracture C7/T1-PT/OT/SLP eval and treat.Cervical collar x 6 weeks  1. DVT Prophylaxis/Anticoagulation: S.Q lovenox-Monitor platelet counts and any bleeding episodes.Follow up labs ok. Resumed ASA 2. Pain Management: Norco prn.Monitor mental status   3. Mood/sleep:  Still sundowning but sleep better yesterday.Ophelia Charter currently at bedside.Discontinue haldol.Continue xanax/ativan prn.may need scheduled med for restlessness.Patient on Xanax 0.5mg  daily PTA for H/O anxiety. There is a report of premorbid dementia, and after speaking with team/family this is likely playing a role here as  well.  -Seroquel increase effective.  Continue for now  -May need to sleep in chair, as it sounds like he did that home and doesn't like to lay down or have feet up! Encourage "bed" sleeping as possible  4.Tobacco abuse/COPD-Taper nocoderm patch.Continue proventil/combivent.O2. See below.  5.HTN-Currently on no present meds.Blood pressure with some increased variables.Patient on HCTZ 12.5 daily and lopressor 25mg   HS prior to admission. Noted H/O STEMI.  BP's not consistently high enough to make standing changes.  6. Increased cough and secretions noted by staff.  CXR show bronchiectasis and ? Fluid/mucous filled pockets in bronchii.   I question really how much of this  is new.  He has an extensive history of collapse on the left.  Added mucinex. Continue O2 per his baseline. Seems quite comfortable today and is sleeping better.  7.Labwork scheduled for Monday.    LOS (Days) 8  FACE To FACE VISIT PEFORMED  SWARTZ,ZACHARY T 10/13/2011, 8:42 AM

## 2011-10-13 NOTE — Progress Notes (Signed)
Speech Language Pathology Progress & Therapy Note  Patient Details  Name: [name] MRN: [id number] Date of Birth: 1932/04/15  Today's Date: 10/13/2011 Time: 1430-1530  Time Calculation: 60 minutes  Precautions: Precautions Precautions: Fall Precaution Comments: falls, awareness, safety attention  Required Braces or Orthoses: Yes Cervical Brace: Applied in supine position;Hard collar Restrictions Weight Bearing Restrictions: No  Short Term Goals: 1. Patient will be oriented to place/reason with mod assitst to utilize external aids/cues. Met 2. Patient will demonstrate intellectual awareness of deficits in ADLs with moderate contextual cues. Met 3. Patient will demonstrate basic problem solving in basic ADLs in minimal verbal/contextual cues. Met  New Short Term Goals: 1. Patient will be orient to time with use of external memory aids with Min A 2. Patient will demonstrate intellectual awareness of deficits in ADLs with minimal contextual cues.  3. Patient will demonstrate basic problem solving in basic ADLs with supervision verbal/contextual cues.   Skilled Therapeutic Interventions: Treatment focus on attention, problem solving and orientation. Mod A verbal and question cues needed to orient to time and utilize calendar. Pt demonstrated lability throughout session, suspect due to increased awareness/insight into impairments. Sequenced 4-step picture cards with Min A verbal and question cues.   Progress Updates: Pt has made functional progress and has met all STG's this reporting period. Currently, pt is overall Min A for functional problem solving and selective attention, Mod A for orientation and selective attention and overall Max A for working memory. Pt demonstrating behaviors consistent with a [place] Level VI. Pt/family education ongoing. Pt would benefit from continued SLP services to maximize cognitive function and overall independence.   Pain Pain Assessment Pain  Assessment: No/denies pain  Oral/Motor: Oral Motor/Sensory Function Labial ROM: Within Functional Limits Labial Symmetry: Within Functional Limits Labial Strength: Within Functional Limits Labial Sensation: Within Functional Limits Lingual ROM: Within Functional Limits Lingual Symmetry: Within Functional Limits Lingual Strength: Within Functional Limits Lingual Sensation: Within Functional Limits Facial ROM: Within Functional Limits Facial Symmetry: Within Functional Limits Facial Strength: Within Functional Limits Facial Sensation: Within Functional Limits Velum: Within Functional Limits Mandible: Within Functional Limits Motor Speech Respiration: Within functional limits Phonation: Normal Resonance: Within functional limits Articulation: Within functional limitis Intelligibility: Intelligible Motor Planning: Witnin functional limits Motor Speech Errors: Not applicable Comprehension: Auditory Comprehension Yes/No Questions: Within Functional Limits Commands: Impaired Two Step Basic Commands: 50-74% accurate Multistep Basic Commands: 0-24% accurate Conversation: Simple Other Conversation Comments: Tangential, confabulatory and confused requiring total assist verbal redirection and therapeutic techniques to reorient. Interfering Components: Attention;Working Radio broadcast assistant: Careers information officer: Within [employer] Reading Comprehension Reading Status: Not tested Expression: Expression Primary Mode of Expression: Verbal Verbal Expression Initiation: No impairment Automatic Speech: Name;Social Response Level of Generative/Spontaneous Verbalization: Conversation Repetition: No impairment Naming: No impairment Pragmatics: Impairment Impairments: Topic appropriateness;Topic maintenance Interfering Components: Attention;Premorbid deficit Effective Techniques: Semantic cues Non-Verbal Means of  Communication: Not applicable Written Expression Dominant Hand: Right Written Expression: Not tested  Therapy/Group: Individual Therapy  [name] 10/13/2011 3:41 PM

## 2011-10-14 MED ORDER — QUETIAPINE FUMARATE 100 MG PO TABS
100.0000 mg | ORAL_TABLET | Freq: Every day | ORAL | Status: DC
  Administered 2011-10-14 – 2011-10-20 (×7): 100 mg via ORAL
  Filled 2011-10-14 (×9): qty 1

## 2011-10-14 NOTE — Progress Notes (Signed)
Pt has not slept as well tonight as previous nights this week. Has slept lightly on and off in recliner, refused to get into bed until 3am, up several times walking around room. Pt perseverating on wife bringing shoes and clothes in, seemed anxious- noted tapping feet on floor/bouncing leg. States "I'll go to bed when you get out of the room and shut the door." Given PRN xanax at 2330 with little change in patient's behavior noted. Received scheduled seroquel at 2130 as well. Per report of day shift nurse, pt did take a couple of short naps during the day. Also, pt noted with productive cough, mod amt of green colored sputum. Given PRN robitussin at 2125. Pt has kept O2 on and sat spot checked in low 90's.

## 2011-10-14 NOTE — Progress Notes (Signed)
Patient ID: Deandra Gadson, male   DOB: 07/28/1932, 77 y.o.   MRN: [id number] Patient ID: Aleksander Edmiston, male   DOB: 1932/01/24, 77 y.o.   MRN: [id number] Patient ID: Zi Newbury, male   DOB: 28-Jun-1932, 77 y.o.   MRN: [id number] Patient ID: Natnael Biederman, male   DOB: 09-Nov-1931, 77 y.o.   MRN: [id number] Patient ID: Billy Turvey, male   DOB: [birth year], 77 y.o.   MRN: [id number] Patient ID: Dannon Nguyenthi, male   DOB: Apr 22, 1932, 77 y.o.   MRN: [id number] Patient ID: Kaulana Brindle, male   DOB: [birth year], 77 y.o.   MRN: [id number] Subjective/Complaints: Review of Systems  Psychiatric/Behavioral: Positive for memory loss. The patient has insomnia.   All other systems reviewed and are negative.  More appropriate yesterday but didn't sleep as well last night   Objective: Vital Signs: Blood pressure 135/78, pulse 87, temperature 98.4 F (36.9 C), temperature source Oral, resp. rate 20, height 5\' 11"  (1.803 m), weight 70.6 kg (155 lb 10.3 oz), SpO2 99.00%. No results found. No results found for this basename: WBC:2,HGB:2,HCT:2,PLT:2 in the last 72 hours No results found for this basename: NA:2,K:2,CL:2,CO2:2,GLUCOSE:2,BUN:2,CREATININE:2,CALCIUM:2 in the last 72 hours CBG (last 3)  No results found for this basename: GLUCAP:3 in the last 72 hours  Wt Readings from Last 3 Encounters:  10/12/11 70.6 kg (155 lb 10.3 oz)  09/28/11 74.844 kg (165 lb)  05/20/11 74.39 kg (164 lb)    Physical Exam:  General appearance: alert, no distress and slowed mentation Head: multiple bruises along left side of face Eyes: conjunctivae/corneas clear. PERRL, EOM's intact. Fundi benign. Ears: normal TM's and external ear canals both ears Nose: Nares normal. Septum midline. Mucosa normal. No drainage or sinus tenderness. Throat: lips, mucosa, and tongue normal; teeth and gums normal Neck: no adenopathy, no carotid bruit, no JVD, supple, symmetrical, trachea midline and thyroid not enlarged, symmetric, no  tenderness/mass/nodules Back: symmetric, no curvature. ROM normal. No CVA tenderness. Resp: Decreased sounds/ronchi/rales/wheezes in entire left lung field. Cardio: regular rate and rhythm, S1, S2 normal, no murmur, click, rub or gallop GI: soft, non-tender; bowel sounds normal; no masses,  no organomegaly Extremities: extremities normal, atraumatic, no cyanosis or edema Pulses: 2+ and symmetric Skin: Skin color, texture, turgor normal. No rashes or lesions bruising improving. Neurologic: More alert and appropriate today.  Oriented to name only.  Can follow simple commands.  Insight remains limited. Exam non focal during my exam while pt seated.       Assessment/Plan: 1. Functional deficits secondary to TBI and transverse process fracture C7/T1  which require 3+ hours per day of interdisciplinary therapy in a comprehensive inpatient rehab setting. Physiatrist is providing close team supervision and 24 hour management of active medical problems listed below. Physiatrist and rehab team continue to assess barriers to discharge/monitor patient progress toward functional and medical goals.  Optimizing sleep and cognition to prepare him for d/c home with wife. Family ed needed.  Mobility: Bed Mobility Bed Mobility: Yes Right Sidelying to Sit: 3: Mod assist Right Sidelying to Sit Details (indicate cue type and reason): c/o dizziness with laying down Transfers Transfers: Yes Sit to Stand: 5: Supervision Stand Pivot Transfers - DO NOT USE: 4: Min assist Stand Pivot Transfer Details (indicate cue type and reason): steady A for balance and attention to functional task Ambulation/Gait Ambulation/Gait Assistance: 5: Supervision Ambulation/Gait Assistance Details (indicate cue type and reason): requires standing rest breaks due to fatigue Ambulation Distance (Feet): 200 Feet Assistive device:  (no AD) Gait Pattern:  Decreased step length - right;Decreased step length - left Gait velocity: slow,  pt wanting to hold handrail Stairs: No Stairs Assistance: 4: Min assist Stairs Assistance Details (indicate cue type and reason): cues for foot placement on stairs, cues for safety Stair Management Technique: Two rails Number of Stairs: 5  Wheelchair Mobility Wheelchair Mobility: No ADL:   Cognition: Cognition Overall Cognitive Status: Impaired Arousal/Alertness: Awake/alert Orientation Level: Oriented to person;Oriented to place;Disoriented to time Attention: Sustained Focused Attention: Appears intact Focused Attention Impairment: Verbal basic;Functional basic Sustained Attention: Appears intact Sustained Attention Impairment: Verbal basic;Functional basic Selective Attention: Impaired Selective Attention Impairment: Functional basic;Verbal basic Alternating Attention: Impaired Alternating Attention Impairment: Verbal basic;Functional basic Divided Attention: Impaired Divided Attention Impairment: Verbal basic;Functional basic Memory: Impaired Memory Impairment: Storage deficit;Decreased recall of new information;Decreased short term memory Decreased Short Term Memory: Verbal basic;Functional basic Awareness: Impaired Awareness Impairment: Intellectual impairment;Emergent impairment;Anticipatory impairment Problem Solving: Impaired Problem Solving Impairment: Verbal basic;Functional basic Executive Function: Reasoning;Sequencing;Organizing;Decision Making;Initiating;Self Monitoring;Self Correcting Reasoning: Impaired Reasoning Impairment: Verbal basic;Functional basic Sequencing: Impaired Sequencing Impairment: Verbal basic;Functional basic Organizing: Impaired Organizing Impairment: Verbal basic;Functional basic Decision Making: Impaired Decision Making Impairment: Verbal basic;Functional basic Initiating: Impaired Initiating Impairment: Verbal basic;Functional basic Self Monitoring: Impaired Self Monitoring Impairment: Verbal basic;Functional basic Self Correcting:  Impaired Self Correcting Impairment: Verbal basic;Functional basic Behaviors: Impulsive;Lability;Confabulation Safety/Judgment: Impaired Comments: performed dynamic balance focusing on alternating attention naming cars and catching a ball while walking min assist for mobility mod assist for attention [facility] Scales of Cognitive Functioning: Confused/appropriate Cognition Arousal/Alertness: Awake/alert Orientation Level: Oriented to person;Oriented to place;Disoriented to time   1.TBI with transverse process fracture C7/T1-PT/OT/SLP eval and treat.Cervical collar x 6 weeks  1. DVT Prophylaxis/Anticoagulation: S.Q lovenox-Monitor platelet counts and any bleeding episodes.Follow up labs ok. Resumed ASA 2. Pain Management: Norco prn.Monitor mental status   3. Mood/sleep:  Still sundowning but sleep better yesterday.Ophelia Charter currently at bedside.Discontinue haldol.Continue xanax/ativan prn.may need scheduled med for restlessness.Patient on Xanax 0.5mg  daily PTA for H/O anxiety. There is a report of premorbid dementia, and after speaking with team/family this is likely playing a role here as well.  -Seroquel: increase to 100mg  scheduled  -May need to sleep in chair, as it sounds like he did that home and doesn't like to lay down or have feet up! Encourage "bed" sleeping as possible  4.Tobacco abuse/COPD-Taper nocoderm patch.Continue proventil/combivent.O2. See below.  5.HTN-Currently on no present meds.Blood pressure with some increased variables.Patient on HCTZ 12.5 daily and lopressor 25mg   HS prior to admission. Noted H/O STEMI.  BP's not consistently high enough to make standing changes.  6. Pulmonary:  CXR show bronchiectasis and ? Fluid/mucous filled pockets in bronchii.   I question really how much of this is new.  He has an extensive history of collapse on the left.  Added mucinex. Continue O2 per his baseline.  I've seen no changes in respiratory status.  7.Labwork scheduled  for Monday.    LOS (Days) 9  FACE To FACE VISIT PEFORMED  Shakedra Beam T 10/14/2011, 8:22 AM

## 2011-10-14 NOTE — Progress Notes (Signed)
Physical Therapy Session Note  Patient Details  Name: [name] MRN: [id number] Date of Birth: 06-17-1932  Today's Date: 10/14/2011 Time: 1000-1100 Time Calculation (min): 60 min  Precautions: Precautions Precautions: Fall Precaution Comments: falls, awareness, safety attention  Required Braces or Orthoses: Yes Cervical Brace: Applied in supine position;Hard collar Restrictions Weight Bearing Restrictions: No  Short Term Goals: PT Short Term Goal 1: Pt will perform functional transfers with supervision PT Short Term Goal 1 - Progress: Met PT Short Term Goal 2: Pt will gait in controlled environment 150' wtih supervision PT Short Term Goal 2 - Progress: Met PT Short Term Goal 3: pt will demo emergent awareness with min A for functional tasks PT Short Term Goal 3 - Progress: Progressing toward goal PT Short Term Goal 4: pt will demonastrate dynamic standing balance for functional taks with supervision PT Short Term Goal 5: pt will demo selective attention to functional task x 5 minutes with supervision  Skilled Therapeutic Interventions: Pt reported not feeling well today, and also sleepy and pale.  Discussed with PA; who will check UA; chest Xrays have been improving.  Tx focused on w/c mobility, gait and therapeutic activities for strengthening, balance and  activity tolerance.  Gait training room to gym: 45' with S, pt stopping to cough; VCs for scanning ahead.  Therex for balance: 10 x 1 each calf raises with R or LUE reach overhead; foot taps on 4" high bench; squats.  Dynamic balance in standing involving squatting, reaching within base of support with L or R hand, trunk rotation to retrieve and place objects, 10 x 1 each L and R hand on tile floor and 10 x 1 each L and R hand on dynamic surface of foam mat on floor, with S.  Pt exhibited 1/20 LOB, backwards, regained independently. Ankle strategy evident for recovering balance.  Due to patient's fatigue this AM, he required multiple  rest breaks in sitting.  Pt was too fatigued to walk back to room; he propelled w/c 40' with S.     General Chart Reviewed: Yes Vital Signs Therapy Vitals Pulse Rate: 99  (during tx) Oxygen Therapy SpO2: 79 % (during tx; slowly rising back to 92% with deep breathing) O2 Flow Rate (L/min): 3 L/min (increased briefly to 4L during tx ) Pulse Oximetry Type: Intermittent Pain Pain Assessment Pain Assessment: No/denies pain       Therapy/Group: Individual Therapy  Nashay Brickley 10/14/2011, 2:50 PM

## 2011-10-14 NOTE — Progress Notes (Signed)
Physical Therapy Session Note  Patient Details  Name: [name] MRN: [id number] Date of Birth: October 27, 1932  Today's Date: 10/14/2011  General Amount of Missed PT Time (min): 30 Minutes Missed Time Reason: Patient fatigue;Patient unwilling/refused to participate without medical reason (Patient just finished with OT, fatigued, agitated)   Edman Circle Faucette 10/14/2011, 4:31 PM

## 2011-10-14 NOTE — Progress Notes (Signed)
Speech Language Pathology Therapy Note   Today's Date: 10/14/2011 Time: 0900-1000 Time Calculation (min): 60 min   Short Term Goals: 1. Patient will be orient to time with use of external memory aids with Min A  2. Patient will demonstrate intellectual awareness of deficits in ADLs with minimal contextual cues.  3. Patient will demonstrate basic problem solving in basic ADLs with supervision verbal/contextual cues.      Skilled Therapeutic Interventions/Progress Updates:  Pt with no lability today.  Completed basic problem-solving and organization tasks using nuts/bolts/washers with overall min assist; problem-solving and sequencing with pictured unsafe scenarios required min assistance to identify solutions.  Mod redirection to maintain topic and sustain attention to tasks.  Pt talking to dogs he thought were in room with him.  Mod assist for orientation to place and situation.  Pt pleasant and friendly and only minimally restless.  Progressing toward goals.   Pain Pain Assessment Pain Assessment: No/denies pain Pain Score: 0-No pain   Therapy/Group: Individual Therapy  Blenda Mounts Laurice 10/14/2011 10:25 AM

## 2011-10-14 NOTE — Progress Notes (Signed)
Occupational Therapy Note  Patient Details  Name: [name] MRN: [id number] Date of Birth: March 02, 1932 Today's Date: 10/14/2011  Time 7:30-8:20 ( )  No/co pain  1:1 Attempted self retraining at sink or shower level however pt could not be directed or redirected to complete task, did help shave himself with max A.  Pt with more confusion with orientation including current situation- total A to help organize thoughts; unable to be fully oriented.  Wanted to call [name](wife) to find out where his car was and to fix therapist's.  Assisted pt to call wife to assist with settling pt down.  Pt kept stating "I'm so confused."  Total A today for cognition (comprehension, memory, problem solving and mod A with social interaction.  Melonie Florida 10/14/2011, 11:28 AM

## 2011-10-14 NOTE — Progress Notes (Signed)
Occupational Therapy Session Note  Patient Details  Name: [name] MRN: [id number] Date of Birth: 22-Jan-1932  Today's Date: 10/14/2011 Time: 11:30-12:00Time Calculation (min): 50 min  Precautions: Precautions Precautions: Fall Precaution Comments: falls, awareness, safety attention  Required Braces or Orthoses: Yes Cervical Brace: Applied in supine position;Hard collar Restrictions Weight Bearing Restrictions: No  Short Term Goals: OT Short Term Goal 1: Pt will don UB clothing with setup OT Short Term Goal 2: Pt will don LB clothing with setup OT Short Term Goal 3: Pt will perform toileting with distant S OT Short Term Goal 4: Pt will perform bed mobility in prep for ADL task mod I OT Short Term Goal 5: Pt will demonstrated selective attention with min cuing during 30 min of functional activity  Skilled Therapeutic Interventions/Progress Updates:    Pt. Engaged in feeding group.  He was mMin A verbal and tactile cues to utilize LUE and for redirection to meal in moderately distracting environment.  He answered questions appropriated and indicated toileting needs.      Pain: none    Therapy/Group: Group Therapy  [name] 10/14/2011, 1:11 PM

## 2011-10-14 NOTE — Progress Notes (Signed)
Occupational Therapy Weekly Progress Note  Patient Details  Name: [name] MRN: [id number] Date of Birth: 07-Jan-1932  Today's Date: 10/14/2011  Patient has met 5 of 5 short term goals.  Pt has made great gains with functional mobility however his cognition is progressing slowly.  Pt's behavior are consistent with a Rancho Level V- VI.  Pt is only oriented to self, however he can perform basic and familiar tasks with mod A for basic problem solving, attention and awareness, etc.  Pt also has not been sleeping at night and has increased SOB with minimal activity requiring 3 liters in therapy.  Patient continues to demonstrate the following deficits: muscle weakness, decreased cardiorespiratoy endurance and decreased oxygen support and decreased attention, decreased awareness, decreased problem solving, decreased safety awareness, decreased memory and delayed processing and therefore will continue to benefit from skilled OT intervention to enhance overall performance with ADL.  Patient progressing toward long term goals..  Continue plan of care.  OT Short Term Goals OT Short Term Goal 1: Pt will don UB clothing with setup   MET OT Short Term Goal 2: Pt will don LB clothing with setup  MET OT Short Term Goal 3: Pt will perform toileting with distant S   MET OT Short Term Goal 4: Pt will perform bed mobility in prep for ADL task mod I  MET OT Short Term Goal 5: Pt will demonstrated selective attention with min cuing during 30 min of functional activity  MET  Therapy Documentation Precautions: Precautions Precautions: Fall Precaution Comments: falls, awareness, safety attention  Required Braces or Orthoses: Yes Cervical Brace: Applied in supine position;Hard collar Restrictions Weight Bearing Restrictions: No    Vital Signs Therapy Vitals Temp: 98.4 F (36.9 C) Temp src: Oral Pulse Rate: 95  Resp: 19  BP: 141/71 mmHg Patient Position, if appropriate: Standing Oxygen Therapy SpO2:  98 % O2 Device: None (Room air) O2 Flow Rate (L/min): 3 L/min (increased briefly to 4L during tx ) Pulse Oximetry Type: Intermittent Pain Pain Assessment Pain Assessment: No/denies pain ADL ADL Grooming: min a for shaving Where Assessed-Grooming: Standing at sink Upper Body Bathing: Moderate cueing;supervision Where Assessed-Upper Body Bathing: Standing at sink Lower Body Bathing: supervision; Moderate cueing Where Assessed-Lower Body Bathing: Standing at sink Upper Body Dressing: supervision Lower Body Dressing: supervision Toileting: supervision Toilet transfer: supervision Shower stall: supervision  Therapy/Group: Individual Therapy  Melonie Florida 10/14/2011, 4:12 PM

## 2011-10-14 NOTE — Progress Notes (Addendum)
Speech Pathology: Dysphagia Treatment Note  Group Session  [phone]  Patient was observed with : Regular and Thin liquids.  Patient was noted to have s/s of aspiration : No  Clinical Impression: SLP facilitated group treatment with supervision semantic cues to problem solve with sequencing of tray set up.  Recommendations:  Continue with current plan of care  Pain:   none Intervention Required:   No   Goals: Progressing  Fae Pippin, M.A., CCC-SLP [phone]

## 2011-10-15 NOTE — Progress Notes (Signed)
Patient went back to sleep in the chair since 3 am. Leland Johns, RN

## 2011-10-15 NOTE — Progress Notes (Signed)
Patient pleasant and cooperative with treatments. Has sitter 24 hours due to implusiveness and ambulating around the room. No unsafe issues noted, does have moments of unsteadiness at times when standing still. Does not like to sit very much and ambulates a lot. Continent of bowel and bladder. Wears O2 @ 2-3 l/min, has shortness of breath at times with activity. Continue with plan of care.

## 2011-10-15 NOTE — Progress Notes (Signed)
Patient was given serequel 100mg  and 2 vicodine at bed time.   Patient slept throughout the night in the chair until 3am, patient was agitated and pulling on the collar.   Leland Johns, RN

## 2011-10-15 NOTE — Progress Notes (Signed)
Patient ID: [name], male   DOB: 06/04/1932, 77 y.o.   MRN: [id number] Patient ID: [name], male   DOB: 01-30-1932, 77 y.o.   MRN: [id number] Patient ID: [name], male   DOB: [birth year], 77 y.o.   MRN: [id number] Patient ID: [name], male   DOB: 06/09/1932, 77 y.o.   MRN: [id number] Patient ID: [name], male   DOB: [birth year], 77 y.o.   MRN: [id number] Patient ID: [name], male   DOB: 07-Aug-1932, 77 y.o.   MRN: [id number] Patient ID: [name], male   DOB: Sep 21, 1932, 77 y.o.   MRN: [id number] Patient ID: [name], male   DOB: 1932-03-26, 77 y.o.   MRN: [id number] Subjective/Complaints: Review of Systems  Psychiatric/Behavioral: Positive for memory loss. The patient has insomnia.   All other systems reviewed and are negative.  Slept the majority of the night.  Still sleeps in chair generally.   Objective: Vital Signs: Blood pressure 153/67, pulse 72, temperature 97.3 F (36.3 C), temperature source Oral, resp. rate 20, height 5\' 11"  (1.803 m), weight 70.6 kg (155 lb 10.3 oz), SpO2 100.00%. No results found. No results found for this basename: WBC:2,HGB:2,HCT:2,PLT:2 in the last 72 hours No results found for this basename: NA:2,K:2,CL:2,CO2:2,GLUCOSE:2,BUN:2,CREATININE:2,CALCIUM:2 in the last 72 hours CBG (last 3)  No results found for this basename: GLUCAP:3 in the last 72 hours  Wt Readings from Last 3 Encounters:  10/12/11 70.6 kg (155 lb 10.3 oz)  09/28/11 74.844 kg (165 lb)  05/20/11 74.39 kg (164 lb)    Physical Exam:  General appearance: alert, no distress and slowed mentation Head: multiple bruises along left side of face Eyes: conjunctivae/corneas clear. PERRL, EOM's intact. Fundi benign. Ears: normal TM's and external ear canals both ears Nose: Nares normal. Septum midline. Mucosa normal. No drainage or sinus tenderness. Throat: lips, mucosa, and tongue normal; teeth and gums normal Neck: no adenopathy, no carotid bruit, no JVD, supple, symmetrical,  trachea midline and thyroid not enlarged, symmetric, no tenderness/mass/nodules Back: symmetric, no curvature. ROM normal. No CVA tenderness. Resp: Decreased sounds/ronchi/rales/wheezes in entire left lung field. Cardio: regular rate and rhythm, S1, S2 normal, no murmur, click, rub or gallop GI: soft, non-tender; bowel sounds normal; no masses,  no organomegaly Extremities: extremities normal, atraumatic, no cyanosis or edema Pulses: 2+ and symmetric Skin: Skin color, texture, turgor normal. No rashes or lesions bruising improving. Neurologic: More alert and appropriate today.  Oriented to name only.  Insight remains limited. Exam non focal during my exam while pt seated.  Oriented to name and place given cues.     Assessment/Plan: 1. Functional deficits secondary to TBI and transverse process fracture C7/T1  which require 3+ hours per day of interdisciplinary therapy in a comprehensive inpatient rehab setting. Physiatrist is providing close team supervision and 24 hour management of active medical problems listed below. Physiatrist and rehab team continue to assess barriers to discharge/monitor patient progress toward functional and medical goals.  Optimizing sleep and cognition to prepare him for d/c home with wife. Family ed needed.  Mobility: Bed Mobility Bed Mobility: Yes Right Sidelying to Sit: 3: Mod assist Right Sidelying to Sit Details (indicate cue type and reason): c/o dizziness with laying down Transfers Transfers: Yes Sit to Stand: 5: Supervision Stand Pivot Transfers - DO NOT USE: 4: Min assist Stand Pivot Transfer Details (indicate cue type and reason): steady A for balance and attention to functional task Ambulation/Gait Ambulation/Gait Assistance: 5: Supervision Ambulation/Gait Assistance Details (indicate cue type and reason): requires  standing rest breaks due to fatigue Ambulation Distance (Feet): 200 Feet Assistive device:  (no AD) Gait Pattern: Decreased step  length - right;Decreased step length - left Gait velocity: slow, pt wanting to hold handrail Stairs: No Stairs Assistance: 4: Min assist Stairs Assistance Details (indicate cue type and reason): cues for foot placement on stairs, cues for safety Stair Management Technique: Two rails Number of Stairs: 5  Wheelchair Mobility Wheelchair Mobility: No ADL:   Cognition: Cognition Overall Cognitive Status: Impaired Arousal/Alertness: Awake/alert Orientation Level: Oriented to person;Oriented to place;Oriented to time Attention: Sustained Focused Attention: Appears intact Focused Attention Impairment: Verbal basic;Functional basic Sustained Attention: Appears intact Sustained Attention Impairment: Verbal basic;Functional basic Selective Attention: Impaired Selective Attention Impairment: Functional basic;Verbal basic Alternating Attention: Impaired Alternating Attention Impairment: Verbal basic;Functional basic Divided Attention: Impaired Divided Attention Impairment: Verbal basic;Functional basic Memory: Impaired Memory Impairment: Storage deficit;Decreased recall of new information;Decreased short term memory Decreased Short Term Memory: Verbal basic;Functional basic Awareness: Impaired Awareness Impairment: Intellectual impairment;Emergent impairment;Anticipatory impairment Problem Solving: Impaired Problem Solving Impairment: Verbal basic;Functional basic Executive Function: Reasoning;Sequencing;Organizing;Decision Making;Initiating;Self Monitoring;Self Correcting Reasoning: Impaired Reasoning Impairment: Verbal basic;Functional basic Sequencing: Impaired Sequencing Impairment: Verbal basic;Functional basic Organizing: Impaired Organizing Impairment: Verbal basic;Functional basic Decision Making: Impaired Decision Making Impairment: Verbal basic;Functional basic Initiating: Impaired Initiating Impairment: Verbal basic;Functional basic Self Monitoring: Impaired Self  Monitoring Impairment: Verbal basic;Functional basic Self Correcting: Impaired Self Correcting Impairment: Verbal basic;Functional basic Behaviors: Impulsive;Lability;Confabulation Safety/Judgment: Impaired Comments: performed dynamic balance focusing on alternating attention naming cars and catching a ball while walking min assist for mobility mod assist for attention [facility] Scales of Cognitive Functioning: Confused/appropriate Cognition Arousal/Alertness: Awake/alert Orientation Level: Oriented to person;Oriented to place;Oriented to time   1.TBI with transverse process fracture C7/T1-PT/OT/SLP eval and treat.Cervical collar x 6 weeks  1. DVT Prophylaxis/Anticoagulation: S.Q lovenox-Monitor platelet counts and any bleeding episodes.Follow up labs ok. Resumed ASA 2. Pain Management: Norco prn.Monitor mental status   3. Mood/sleep:  Still sundowning but sleep better yesterday.Ophelia Charter currently at bedside.Discontinue haldol.Continue xanax/ativan prn.may need scheduled med for restlessness.Patient on Xanax 0.5mg  daily PTA for H/O anxiety. There is a report of premorbid dementia, and after speaking with team/family this is likely playing a role here as well.  -Seroquel: increase to 100mg  scheduled--observe for continued response  -May need to sleep in chair, as it sounds like he did that home and doesn't like to lay down or have feet up! Encourage "bed" sleeping as possible  4.Tobacco abuse/COPD-Taper nocoderm patch.Continue proventil/combivent.O2. See below.  5.HTN-Currently on no present meds.Blood pressure with some increased variables.Patient on HCTZ 12.5 daily and lopressor 25mg   HS prior to admission. Noted H/O STEMI.  BP's not consistently high enough to make standing changes.  6. Pulmonary:    Added mucinex. Continue O2 per his baseline.  I've seen no changes in respiratory status. Has baseline bronchiectasis and collapse on left  7.Labwork scheduled for Monday.    LOS  (Days) 10  FACE To FACE VISIT PEFORMED  Starling Jessie T 10/15/2011, 7:18 AM

## 2011-10-15 NOTE — Progress Notes (Signed)
Occupational Therapy Note  Patient Details  Name: [name] MRN: [id number] Date of Birth: May 18, 1932 Today's Date: 10/15/2011  Time in/out: 3-4pm Total Minutes: 60  Pain: 0/10; no report of pain  Skilled Intervention: Therapeutic activities with emphasis on sustained attention, memory, fine motor control, safety, energy conservation.   Patient completed inspection of 3 wheelchairs seated, with OT supervision, testing braking systems and flip-back armrests.   Patient sustained attention in a distraction free environment for 7 minutes before requesting to call his wife; he was returned to his room and satisfied with attempted call (busy signal) and then resumed supervised activity in w/c storage room.   Patient resumed activity for 33 minutes until end of treatment session.   Patient was most limited by poor vision, stating he did not know where his glasses were (not found in room) and he could not see parts or sizes of sockets on socket set.   Patient required constant redirection and assist with finding correct tools and identifying components of wheelchair.   He was not able to recall or generalize steps and processes from one w/c to another.   He successfully tightened one bolt and adjusted tension of 1 brake on w/c wheel.   Patient demo'd impulsivity 1X, attempting to stand w/o locking his brakes or awareness of his wheelchair footrests; fall risk remains d/t poor orientation and impulsivity.  Individual Session.   Georgeanne Nim 10/15/2011, 4:29 PM

## 2011-10-16 NOTE — Progress Notes (Signed)
Patient ID: [name], male   DOB: 18-Jun-1932, 77 y.o.   MRN: [id number] Patient ID: [name], male   DOB: 15-Dec-1931, 77 y.o.   MRN: [id number] Patient ID: [name], male   DOB: 1932-03-18, 77 y.o.   MRN: [id number] Patient ID: [name], male   DOB: 15-Jul-1932, 77 y.o.   MRN: [id number] Patient ID: [name], male   DOB: 17-May-1932, 77 y.o.   MRN: [id number] Patient ID: [name], male   DOB: 1932/03/06, 77 y.o.   MRN: [id number] Patient ID: [name], male   DOB: 22-Dec-1931, 77 y.o.   MRN: [id number] Patient ID: [name], male   DOB: [birth year], 77 y.o.   MRN: [id number] Patient ID: [name], male   DOB: 07-27-1932, 77 y.o.   MRN: [id number] Subjective/Complaints: Review of Systems  Psychiatric/Behavioral: Positive for memory loss. The patient has insomnia.   All other systems reviewed and are negative.  Slept the majority of the night again.  Still sleeps in chair generally.   Objective: Vital Signs: Blood pressure 181/75, pulse 82, temperature 98 F (36.7 C), temperature source Oral, resp. rate 17, height 5\' 11"  (1.803 m), weight 70.6 kg (155 lb 10.3 oz), SpO2 100.00%. No results found. No results found for this basename: WBC:2,HGB:2,HCT:2,PLT:2 in the last 72 hours No results found for this basename: NA:2,K:2,CL:2,CO2:2,GLUCOSE:2,BUN:2,CREATININE:2,CALCIUM:2 in the last 72 hours CBG (last 3)  No results found for this basename: GLUCAP:3 in the last 72 hours  Wt Readings from Last 3 Encounters:  10/12/11 70.6 kg (155 lb 10.3 oz)  09/28/11 74.844 kg (165 lb)  05/20/11 74.39 kg (164 lb)    Physical Exam:  General appearance: alert, no distress and slowed mentation Head: multiple bruises along left side of face Eyes: conjunctivae/corneas clear. PERRL, EOM's intact. Fundi benign. Ears: normal TM's and external ear canals both ears Nose: Nares normal. Septum midline. Mucosa normal. No drainage or sinus tenderness. Throat: lips, mucosa, and tongue normal; teeth and gums  normal Neck: no adenopathy, no carotid bruit, no JVD, supple, symmetrical, trachea midline and thyroid not enlarged, symmetric, no tenderness/mass/nodules Back: symmetric, no curvature. ROM normal. No CVA tenderness. Resp: Decreased sounds/ronchi/rales/wheezes in entire left lung field. Cardio: regular rate and rhythm, S1, S2 normal, no murmur, click, rub or gallop GI: soft, non-tender; bowel sounds normal; no masses,  no organomegaly Extremities: extremities normal, atraumatic, no cyanosis or edema Pulses: 2+ and symmetric Skin: Skin color, texture, turgor normal. No rashes or lesions bruising improving. Neurologic: More alert and appropriate today.  Oriented to name only.  Insight remains limited. Exam non focal during my exam while pt seated.  Oriented to name and place given cues only.     Assessment/Plan: 1. Functional deficits secondary to TBI and transverse process fracture C7/T1  which require 3+ hours per day of interdisciplinary therapy in a comprehensive inpatient rehab setting. Physiatrist is providing close team supervision and 24 hour management of active medical problems listed below. Physiatrist and rehab team continue to assess barriers to discharge/monitor patient progress toward functional and medical goals.  Optimizing sleep and cognition to prepare him for d/c home with wife. Family ed.  Mobility: Bed Mobility Bed Mobility: Yes Right Sidelying to Sit: 3: Mod assist Right Sidelying to Sit Details (indicate cue type and reason): c/o dizziness with laying down Transfers Transfers: Yes Sit to Stand: 5: Supervision Stand Pivot Transfers - DO NOT USE: 4: Min assist Stand Pivot Transfer Details (indicate cue type and reason): steady A for balance and attention to  functional task Ambulation/Gait Ambulation/Gait Assistance: 5: Supervision Ambulation/Gait Assistance Details (indicate cue type and reason): requires standing rest breaks due to fatigue Ambulation Distance  (Feet): 200 Feet Assistive device:  (no AD) Gait Pattern: Decreased step length - right;Decreased step length - left Gait velocity: slow, pt wanting to hold handrail Stairs: No Stairs Assistance: 4: Min assist Stairs Assistance Details (indicate cue type and reason): cues for foot placement on stairs, cues for safety Stair Management Technique: Two rails Number of Stairs: 5  Wheelchair Mobility Wheelchair Mobility: No ADL:   Cognition: Cognition Overall Cognitive Status: Impaired Arousal/Alertness: Awake/alert Orientation Level: Oriented to person;Oriented to place;Oriented to time Attention: Sustained Focused Attention: Appears intact Focused Attention Impairment: Verbal basic;Functional basic Sustained Attention: Appears intact Sustained Attention Impairment: Verbal basic;Functional basic Selective Attention: Impaired Selective Attention Impairment: Functional basic;Verbal basic Alternating Attention: Impaired Alternating Attention Impairment: Verbal basic;Functional basic Divided Attention: Impaired Divided Attention Impairment: Verbal basic;Functional basic Memory: Impaired Memory Impairment: Storage deficit;Decreased recall of new information;Decreased short term memory Decreased Short Term Memory: Verbal basic;Functional basic Awareness: Impaired Awareness Impairment: Intellectual impairment;Emergent impairment;Anticipatory impairment Problem Solving: Impaired Problem Solving Impairment: Verbal basic;Functional basic Executive Function: Reasoning;Sequencing;Organizing;Decision Making;Initiating;Self Monitoring;Self Correcting Reasoning: Impaired Reasoning Impairment: Verbal basic;Functional basic Sequencing: Impaired Sequencing Impairment: Verbal basic;Functional basic Organizing: Impaired Organizing Impairment: Verbal basic;Functional basic Decision Making: Impaired Decision Making Impairment: Verbal basic;Functional basic Initiating: Impaired Initiating  Impairment: Verbal basic;Functional basic Self Monitoring: Impaired Self Monitoring Impairment: Verbal basic;Functional basic Self Correcting: Impaired Self Correcting Impairment: Verbal basic;Functional basic Behaviors: Impulsive;Lability;Confabulation Safety/Judgment: Impaired Comments: performed dynamic balance focusing on alternating attention naming cars and catching a ball while walking min assist for mobility mod assist for attention [facility] Scales of Cognitive Functioning: Confused/appropriate Cognition Arousal/Alertness: Awake/alert Orientation Level: Oriented to person;Oriented to place;Oriented to time   1.TBI with transverse process fracture C7/T1-PT/OT/SLP eval and treat.Cervical collar x 6 weeks  1. DVT Prophylaxis/Anticoagulation: S.Q lovenox-Monitor platelet counts and any bleeding episodes.Follow up labs ok. Resumed ASA 2. Pain Management: Norco prn.Monitor mental status   3. Mood/sleep:  Still sundowning but sleep better yesterday.Ophelia Charter currently at bedside.Discontinue haldol.Continue xanax/ativan prn.may need scheduled med for restlessness.Patient on Xanax 0.5mg  daily PTA for H/O anxiety. There is a report of premorbid dementia, and after speaking with team/family this is likely playing a role here as well.  -Seroquel: increase to 100mg  scheduled--observe for continued response  -May need to sleep in chair, as it sounds like he did that home and doesn't like to lay down or have feet up! Encourage "bed" sleeping as possible  4.Tobacco abuse/COPD-Taper nocoderm patch.Continue proventil/combivent.O2  5.HTN-Currently on no present meds.Blood pressure with some increased variables.Patient on HCTZ 12.5 daily and lopressor 25mg   HS prior to admission. Noted H/O STEMI. Improved  6. Pulmonary:    Added mucinex. Continue O2 per his baseline.  I've seen no changes in respiratory status. Has baseline bronchiectasis and collapse on left.    7.Labwork scheduled for  Monday.    LOS (Days) 11  FACE To FACE VISIT PEFORMED  SWARTZ,ZACHARY T 10/16/2011, 7:18 AM

## 2011-10-16 NOTE — Progress Notes (Signed)
Physical Therapy Note  Patient Details  Name: [name] MRN: [id number] Date of Birth: 1932-06-26 Today's Date: 10/16/2011  Individual therapy 1300-1345 (45 minutes) No c/o pain, "I just have been a little disoriented lately" Able to identify that he was at [facility]. 3L of O2 via [place] during therapy session. Treatment session focused on gait in home environment, functional task and problem solving putting away items in their appropriate locations, mod cueing needed to stay on task. Pt distracted that his wife and granddaughter were supposed to be coming to visit and wanted to continue to check back in his room if they arrived. Pt tried to call them but did not get an answer, encouraged pt that they were probably on their way. Pt requiring frequent rest breaks and cues for deep breathing, sats remained between 92-97% on 3L despite pt reporting it was hard to breathe.   Karolee Stamps [facility] 10/16/2011, 1:49 PM

## 2011-10-16 NOTE — Progress Notes (Signed)
Patient cooperating well with therapies, and continues to be confused at times. Ambulates with a sitter in place. Follows commands appropriately. No unsafe issues noted. Had one compliant of shortness of breath after using the bathroom, O2 sats were between 93-95%, no other complaints noted afterward. No complaints of pain today, tolerates therapies well. Has had multiple visitors today and wife at the bedside this afternoon. Cont plan of care.

## 2011-10-17 LAB — CBC
MCH: 27.9 pg (ref 26.0–34.0)
MCV: 88.2 fL (ref 78.0–100.0)
Platelets: 444 10*3/uL — ABNORMAL HIGH (ref 150–400)
RDW: 13 % (ref 11.5–15.5)
WBC: 9.9 10*3/uL (ref 4.0–10.5)

## 2011-10-17 LAB — BASIC METABOLIC PANEL
Calcium: 9.3 mg/dL (ref 8.4–10.5)
Creatinine, Ser: 0.62 mg/dL (ref 0.50–1.35)
GFR calc Af Amer: >90 mL/min (ref >90)
Sodium: 138 mEq/L (ref 135–145)

## 2011-10-17 NOTE — Progress Notes (Signed)
Speech Pathology: Dysphagia Treatment Note  Group Session [phone]  Patient was observed with : Mechanical Soft / Ground and Thin liquids.  Patient was noted to have s/s of aspiration : No:  However, appeared SOB at times, O2 monitor used to chech range and patient cued to breathe in through nose  Patient required: no cues to consistently follow precautions/strategies  Clinical Impression: SLP facilitated session with increased vocal intensity and supervision semantic cues to organize and set up tray.   Recommendations:  Continue with current orders and plan of care.  Pain:   none Intervention Required:   No  Goals: Progressing  Fae Pippin, M.A., CCC-SLP [phone]

## 2011-10-17 NOTE — Progress Notes (Signed)
Occupational Therapy Note  Patient Details  Name: [name] MRN: [id number] Date of Birth: June 17, 1932 Today's Date: 10/17/2011  13:00 - 14:00 Pt with no complaint of pain Individual treatment session.  Worked with pt on sequencing and problem  Solving using a 3 dimensional puzzle.  Pt required min questioning cues to sequence through the task and identify parts of varying size using a visual reference.  Transitioned to therapeutic activity with focus on balance and simple money management.  Pt continues to need max questioning cues to perform simple addition and subtraction correctly.  Pt also continues to need mod demonstrational cueing to keep track of his oxygen line.  O2 sats remained at 95- 97 % on 2ls nasal cannula.  Pt did however exhibit dyspnea of 3/4 requiring frequent rest breaks after standing.   Melonie Florida 10/17/2011, 1:27 PM

## 2011-10-17 NOTE — Progress Notes (Signed)
Occupational Therapy Note  Patient Details  Name: [name] MRN: [id number] Date of Birth: Apr 04, 1932 Today's Date: 10/17/2011  Time 10-11(38min) No c/o pain just SOB  1:1 self care retraining at shower level with mod directive cuing for sequencing and saftey with O2 tubing. Focus on activity tolerance, sustained to selective attention with mod cuing, standing tolerance, procedural reasoning for basic and familiar ADL tasks, managing O2 stats.  Pt with decreased activity tolerance and SOB on 3 liters however stats remained >90%.  Pt needed frequent rest breaks in sitting and extra time for all tasks. Pt also with a productive cough (yellowish).  Melonie [place] 10/17/2011, 11:26 AM

## 2011-10-17 NOTE — Progress Notes (Signed)
Speech Pathology: Individual Treatment Note  Subjective:  Upright in recliner; eyes closed with sitter at his side.  Objective: Patient kept his eyes closed the majority of the session, stating "I can't think and talk at the same time, I need to keep them closed to concentrate."  SLP utilized various methods to facilitate sustained attention and patient engagement via assisting patient with making menu choices with dietary staff (maximum assist to recall 3 choices; reduced them to 2 at a time due to poor working memory), ambulating to office to facilitate wakefulness/attention (required 2 rest breaks, leaning against the wall) as well as numerous functional tasks (meds, money, newspaper) with poor ability to sustain his attention, open his eyes, or engage in a meaningful way for benefit of therapeutic environment.  Review of his status with nursing revealed that patient did not sleep last night and was awake, slightly restless the majority of the night.   *Missed the remaining 15 minutes of treatment due to need to return to room to sleep.  Assessment: Baseline dementia (undiagnosed) with superimposed TBI impacts functional cognitive-linguistic abilities to complete basic ADLs and engage in a meaningful way.  Sleep/wake cycle issues further impede his function.  Short Term Goals:  1. Patient will be orient to time with use of external memory aids with Min A  2. Patient will demonstrate intellectual awareness of deficits in ADLs with minimal contextual cues.  3. Patient will demonstrate basic problem solving in basic ADLs with supervision verbal/contextual cues.   Recommendations:  1.  Continue with POC  Pain:   none Intervention Required:   No  Goals: No Goals Met  Myra Rude, M.S.,CCC-SLP Pager [phone]

## 2011-10-17 NOTE — Progress Notes (Signed)
Subjective/Complaints: Review of Systems  Psychiatric/Behavioral: Positive for memory loss. The patient has insomnia.   All other systems reviewed and are negative.  Slept the majority of the night again.  Still sleeps in chair generally.  Needed extra seroquel last night.  Apparently got riled up by family in the PM.   Objective: Vital Signs: Blood pressure 139/62, pulse 68, temperature 97.7 F (36.5 C), temperature source Oral, resp. rate 20, height 5\' 11"  (1.803 m), weight 70.6 kg (155 lb 10.3 oz), SpO2 99.00%. No results found.  Basename 10/17/11 0725  WBC 9.9  HGB 11.1*  HCT 35.1*  PLT 444*   No results found for this basename: NA:2,K:2,CL:2,CO2:2,GLUCOSE:2,BUN:2,CREATININE:2,CALCIUM:2 in the last 72 hours CBG (last 3)  No results found for this basename: GLUCAP:3 in the last 72 hours  Wt Readings from Last 3 Encounters:  10/12/11 70.6 kg (155 lb 10.3 oz)  09/28/11 74.844 kg (165 lb)  05/20/11 74.39 kg (164 lb)    Physical Exam:  General appearance: alert, no distress and slowed mentation Head: multiple bruises along left side of face Eyes: conjunctivae/corneas clear. PERRL, EOM's intact. Fundi benign. Ears: normal TM's and external ear canals both ears Nose: Nares normal. Septum midline. Mucosa normal. No drainage or sinus tenderness. Throat: lips, mucosa, and tongue normal; teeth and gums normal Neck: no adenopathy, no carotid bruit, no JVD, supple, symmetrical, trachea midline and thyroid not enlarged, symmetric, no tenderness/mass/nodules Back: symmetric, no curvature. ROM normal. No CVA tenderness. Resp: Decreased sounds/ronchi/rales/wheezes in entire left lung field. Cardio: regular rate and rhythm, S1, S2 normal, no murmur, click, rub or gallop GI: soft, non-tender; bowel sounds normal; no masses,  no organomegaly Extremities: extremities normal, atraumatic, no cyanosis or edema Pulses: 2+ and symmetric Skin: Skin color, texture, turgor normal. No rashes or  lesions bruising improving. Neurologic: Alert. Oriented to name only.  Insight remains limited. Exam non focal during my exam while pt seated.  Oriented to name and place given cues only. No progress in neuro exam.     Assessment/Plan: 1. Functional deficits secondary to TBI and transverse process fracture C7/T1  which require 3+ hours per day of interdisciplinary therapy in a comprehensive inpatient rehab setting. Physiatrist is providing close team supervision and 24 hour management of active medical problems listed below. Physiatrist and rehab team continue to assess barriers to discharge/monitor patient progress toward functional and medical goals.  Optimizing sleep and cognition to prepare him for d/c home with wife. Family ed.  Mobility: Bed Mobility Bed Mobility: Yes Right Sidelying to Sit: 3: Mod assist Right Sidelying to Sit Details (indicate cue type and reason): c/o dizziness with laying down Transfers Transfers: Yes Sit to Stand: 5: Supervision Stand Pivot Transfers - DO NOT USE: 4: Min assist Stand Pivot Transfer Details (indicate cue type and reason): steady A for balance and attention to functional task Ambulation/Gait Ambulation/Gait Assistance: 5: Supervision Ambulation/Gait Assistance Details (indicate cue type and reason): requires standing rest breaks due to fatigue Ambulation Distance (Feet): 200 Feet Assistive device:  (no AD) Gait Pattern: Decreased step length - right;Decreased step length - left Gait velocity: slow, pt wanting to hold handrail Stairs: No Stairs Assistance: 4: Min assist Stairs Assistance Details (indicate cue type and reason): cues for foot placement on stairs, cues for safety Stair Management Technique: Two rails Number of Stairs: 5  Wheelchair Mobility Wheelchair Mobility: No ADL:   Cognition: Cognition Overall Cognitive Status: Impaired Arousal/Alertness: Awake/alert Orientation Level: Oriented to person;Oriented to  place;Oriented to time Attention: Sustained  Focused Attention: Appears intact Focused Attention Impairment: Verbal basic;Functional basic Sustained Attention: Appears intact Sustained Attention Impairment: Verbal basic;Functional basic Selective Attention: Impaired Selective Attention Impairment: Functional basic;Verbal basic Alternating Attention: Impaired Alternating Attention Impairment: Verbal basic;Functional basic Divided Attention: Impaired Divided Attention Impairment: Verbal basic;Functional basic Memory: Impaired Memory Impairment: Storage deficit;Decreased recall of new information;Decreased short term memory Decreased Short Term Memory: Verbal basic;Functional basic Awareness: Impaired Awareness Impairment: Intellectual impairment;Emergent impairment;Anticipatory impairment Problem Solving: Impaired Problem Solving Impairment: Verbal basic;Functional basic Executive Function: Reasoning;Sequencing;Organizing;Decision Making;Initiating;Self Monitoring;Self Correcting Reasoning: Impaired Reasoning Impairment: Verbal basic;Functional basic Sequencing: Impaired Sequencing Impairment: Verbal basic;Functional basic Organizing: Impaired Organizing Impairment: Verbal basic;Functional basic Decision Making: Impaired Decision Making Impairment: Verbal basic;Functional basic Initiating: Impaired Initiating Impairment: Verbal basic;Functional basic Self Monitoring: Impaired Self Monitoring Impairment: Verbal basic;Functional basic Self Correcting: Impaired Self Correcting Impairment: Verbal basic;Functional basic Behaviors: Impulsive;Lability;Confabulation Safety/Judgment: Impaired Comments: performed dynamic balance focusing on alternating attention naming cars and catching a ball while walking min assist for mobility mod assist for attention [facility] Scales of Cognitive Functioning: Confused/appropriate Cognition Arousal/Alertness: Awake/alert Orientation Level:  Oriented to person;Oriented to place;Oriented to time   1.TBI with transverse process fracture C7/T1-PT/OT/SLP eval and treat.Cervical collar x 6 weeks  1. DVT Prophylaxis/Anticoagulation: S.Q lovenox-Monitor platelet counts and any bleeding episodes.Follow up labs ok. Resumed ASA 2. Pain Management: Norco prn.Monitor mental status   3. Mood/sleep:  Still sundowning but sleep better yesterday.Ophelia Charter currently at bedside.Discontinue haldol.Continue xanax/ativan prn.may need scheduled med for restlessness.Patient on Xanax 0.5mg  daily PTA for H/O anxiety. There is a report of premorbid dementia, and after speaking with team/family this is likely playing a role here as well.  -Seroquel: 100mg  qhs with back up dose.  -May need to sleep in chair, as it sounds like he did that home and doesn't like to lay down or have feet up! Encourage "bed" sleeping as possible  4.Tobacco abuse/COPD-Taper nocoderm patch.Continue proventil/combivent.O2  5.HTN-Currently on no present meds.Blood pressure with some increased variables.Patient on HCTZ 12.5 daily and lopressor 25mg   HS prior to admission. Noted H/O STEMI. Improved  6. Pulmonary:    Added mucinex. Continue O2 per his baseline.  I've seen no changes in respiratory status. Has baseline bronchiectasis and collapse on left.    7.Labwork scheduled for Monday is normal so far.    LOS (Days) 12  FACE To FACE VISIT PEFORMED  SWARTZ,ZACHARY T 10/17/2011, 8:00 AM

## 2011-10-17 NOTE — Progress Notes (Signed)
Physical Therapy Note  Patient Details  Name: [name] MRN: [id number] Date of Birth: 1932-05-22 Today's Date: 10/17/2011  TIME IN/OUT  1400-1500 Individual therapy Pain: no c/o pain, only fatigue and labored breathing but O2 sats on 3L >95% with all mobility and dypsnea 2/4  Skilled intervention: Therapeutic exercise for activity tolerance, strength, balance: heel, toe and tandem walking with min steady A, standing hip flexion, abduction and knee flexion w/ 3# 10x, heel/toe raises, squats and 10 sit to stands without use of UE's; one or no UE support to challenge balance w/ close S  Cognitive rehab- pt kept track of exercise reps with questioning cues, had some difficulty following commands for the exercises techniques; impaired working and prospective memory noted; mod assist to pathfind room to gym to room; pt frequently repeating self and asking if PT had spoke with wife today   Michaelene Song 10/17/2011, 3:07 PM

## 2011-10-17 NOTE — Progress Notes (Signed)
OccupationalTherapy Note  Patient Details  Name: [name] MRN: [id number] Date of Birth: November 02, 1932 Today's Date: 10/17/2011  Time:1130 - 1200 30 min group [employer] Skilled Intervention: Pt following safe swallowing techniques and using BUE hands in functional tasks. Cues for STM. Appropriate social behavior. Pt states he was not feeling well today. SOB noted. O2 SATS 92 - 97. nsg notified.   Group therapy. [name],HILLARY 10/17/2011, 1:59 PM

## 2011-10-18 MED ORDER — QUETIAPINE FUMARATE 100 MG PO TABS: 100.0000 mg | ORAL_TABLET | Freq: Every day | ORAL | Status: DC

## 2011-10-18 MED ORDER — NICOTINE 21 MG/24HR TD PT24: 21.0000 | MEDICATED_PATCH | Freq: Every day | TRANSDERMAL | Status: AC

## 2011-10-18 MED ORDER — HYDROCODONE-ACETAMINOPHEN 5-325 MG PO TABS: 1.0000 | ORAL_TABLET | Freq: Four times a day (QID) | ORAL | Status: AC | PRN

## 2011-10-18 MED ORDER — ALPRAZOLAM 0.5 MG PO TABS: 0.5000 mg | ORAL_TABLET | Freq: Four times a day (QID) | ORAL | Status: AC | PRN

## 2011-10-18 NOTE — Progress Notes (Signed)
Physical Therapy Session Note  Patient Details  Name: [name] MRN: [id number] Date of Birth: 11-02-1932  Today's Date: 10/18/2011 Time: 1000-1055 Time Calculation (min): 55 min  Precautions: Precautions Precautions: Fall Precaution Comments: 3L O2 Required Braces or Orthoses: Yes Cervical Brace: Hard collar Restrictions Weight Bearing Restrictions: No  Skilled Therapeutic Interventions/Progress Updates:      General Chart Reviewed: Yes Vital Signs O2 on 3L 96% with activity, dypsnea 2/4, HR 111 Pain Pain Assessment Pain Assessment: No/denies pain Mobility Transfers Sit to Stand: 6: Modified independent (Device/Increase time);Without upper extremity assist Locomotion  Ambulation Ambulation/Gait Assistance: 5: Supervision (assist for O2) Ambulation Distance (Feet): 150 Feet Ambulation/Gait Assistance Details (indicate cue type and reason): standing rest breaks d/t fatigue      Balance Static Standing Balance Static Standing - Level of Assistance: 6: Modified independent (Device/Increase time) Dynamic Standing Balance Dynamic Standing - Level of Assistance: 5: Stand by assistance (shuffle board and kicking)    Other Treatments  Nustep level 5 working on divided attention to [employer] stepping while [employer] with assist for cognitive task ( mod questioning cues to come up with items and min to keep LE's moving); max asisst to keep score of shuffleboard game d/t impaired working and short term memory  Therapy/Group: Individual Therapy  Michaelene Song 10/18/2011, 3:14 PM

## 2011-10-18 NOTE — Progress Notes (Signed)
Patient ID: [name], male   DOB: [birth year], 77 y.o.   MRN: [id number] Subjective/Complaints: Review of Systems  Psychiatric/Behavioral: Positive for memory loss. The patient has insomnia.   All other systems reviewed and are negative.  Nights touch and go at times.  Objective: Vital Signs: Blood pressure 169/74, pulse 74, temperature 98.3 F (36.8 C), temperature source Oral, resp. rate 20, height 5\' 11"  (1.803 m), weight 70.6 kg (155 lb 10.3 oz), SpO2 100.00%. No results found.  Basename 10/17/11 0725  WBC 9.9  HGB 11.1*  HCT 35.1*  PLT 444*    Basename 10/17/11 0725  NA 138  K 3.5  CL 98  CO2 32  GLUCOSE 95  BUN 12  CREATININE 0.62  CALCIUM 9.3   CBG (last 3)  No results found for this basename: GLUCAP:3 in the last 72 hours  Wt Readings from Last 3 Encounters:  10/12/11 70.6 kg (155 lb 10.3 oz)  09/28/11 74.844 kg (165 lb)  05/20/11 74.39 kg (164 lb)    Physical Exam:  General appearance: alert, no distress and slowed mentation Head: multiple bruises along left side of face Eyes: conjunctivae/corneas clear. PERRL, EOM's intact. Fundi benign. Ears: normal TM's and external ear canals both ears Nose: Nares normal. Septum midline. Mucosa normal. No drainage or sinus tenderness. Throat: lips, mucosa, and tongue normal; teeth and gums normal Neck: no adenopathy, no carotid bruit, no JVD, supple, symmetrical, trachea midline and thyroid not enlarged, symmetric, no tenderness/mass/nodules Back: symmetric, no curvature. ROM normal. No CVA tenderness. Resp: Decreased sounds/ronchi/rales/wheezes in entire left lung field. Cardio: regular rate and rhythm, S1, S2 normal, no murmur, click, rub or gallop GI: soft, non-tender; bowel sounds normal; no masses,  no organomegaly Extremities: extremities normal, atraumatic, no cyanosis or edema Pulses: 2+ and symmetric Skin: Skin color, texture, turgor normal. No rashes or lesions bruising improving. Neurologic: Alert.  Oriented to name only.  Insight remains limited. Exam non focal during my exam while pt seated.  Oriented to name and place given cues only.  Exam can wax and wane.     Assessment/Plan: 1. Functional deficits secondary to TBI and transverse process fracture C7/T1  which require 3+ hours per day of interdisciplinary therapy in a comprehensive inpatient rehab setting. Physiatrist is providing close team supervision and 24 hour management of active medical problems listed below. Physiatrist and rehab team continue to assess barriers to discharge/monitor patient progress toward functional and medical goals.  Optimizing sleep and cognition to prepare him for d/c home with wife. Family ed.  Mobility: Bed Mobility Bed Mobility: Yes Right Sidelying to Sit: 3: Mod assist Right Sidelying to Sit Details (indicate cue type and reason): c/o dizziness with laying down Transfers Transfers: Yes Sit to Stand: 5: Supervision Stand Pivot Transfers - DO NOT USE: 4: Min assist Stand Pivot Transfer Details (indicate cue type and reason): steady A for balance and attention to functional task Ambulation/Gait Ambulation/Gait Assistance: 5: Supervision Ambulation/Gait Assistance Details (indicate cue type and reason): requires standing rest breaks due to fatigue Ambulation Distance (Feet): 200 Feet Assistive device:  (no AD) Gait Pattern: Decreased step length - right;Decreased step length - left Gait velocity: slow, pt wanting to hold handrail Stairs: No Stairs Assistance: 4: Min assist Stairs Assistance Details (indicate cue type and reason): cues for foot placement on stairs, cues for safety Stair Management Technique: Two rails Number of Stairs: 5  Wheelchair Mobility Wheelchair Mobility: No ADL:   Cognition: Cognition Overall Cognitive Status: Impaired Arousal/Alertness: Awake/alert Orientation Level:  Oriented to person;Oriented to place;Oriented to time Attention: Sustained Focused Attention:  Appears intact Focused Attention Impairment: Verbal basic;Functional basic Sustained Attention: Appears intact Sustained Attention Impairment: Verbal basic;Functional basic Selective Attention: Impaired Selective Attention Impairment: Functional basic;Verbal basic Alternating Attention: Impaired Alternating Attention Impairment: Verbal basic;Functional basic Divided Attention: Impaired Divided Attention Impairment: Verbal basic;Functional basic Memory: Impaired Memory Impairment: Storage deficit;Decreased recall of new information;Decreased short term memory Decreased Short Term Memory: Verbal basic;Functional basic Awareness: Impaired Awareness Impairment: Intellectual impairment;Emergent impairment;Anticipatory impairment Problem Solving: Impaired Problem Solving Impairment: Verbal basic;Functional basic Executive Function: Reasoning;Sequencing;Organizing;Decision Making;Initiating;Self Monitoring;Self Correcting Reasoning: Impaired Reasoning Impairment: Verbal basic;Functional basic Sequencing: Impaired Sequencing Impairment: Verbal basic;Functional basic Organizing: Impaired Organizing Impairment: Verbal basic;Functional basic Decision Making: Impaired Decision Making Impairment: Verbal basic;Functional basic Initiating: Impaired Initiating Impairment: Verbal basic;Functional basic Self Monitoring: Impaired Self Monitoring Impairment: Verbal basic;Functional basic Self Correcting: Impaired Self Correcting Impairment: Verbal basic;Functional basic Behaviors: Impulsive;Lability;Confabulation Safety/Judgment: Impaired Comments: performed dynamic balance focusing on alternating attention naming cars and catching a ball while walking min assist for mobility mod assist for attention [facility] Scales of Cognitive Functioning: Confused/appropriate Cognition Arousal/Alertness: Awake/alert Orientation Level: Oriented to person;Oriented to place;Oriented to time   1.TBI with  transverse process fracture C7/T1-PT/OT/SLP eval and treat.Cervical collar x 6 weeks  1. DVT Prophylaxis/Anticoagulation: S.Q lovenox-Monitor platelet counts and any bleeding episodes.Follow up labs ok. Resumed ASA 2. Pain Management: Norco prn.Monitor mental status   3. Mood/sleep:  Still sundowning but sleep better yesterday.Ophelia Charter currently at bedside.Discontinue haldol.Continue xanax/ativan prn.may need scheduled med for restlessness.Patient on Xanax 0.5mg  daily PTA for H/O anxiety. There is a report of premorbid dementia, and after speaking with team/family this is likely playing a role here as well.  -Seroquel: 100mg  qhs with back up dose.  -May need to sleep in chair, as it sounds like he did that home and doesn't like to lay down or have feet up! Encourage "bed" sleeping as possible Overall sleep has improved. Discuss further with team today.  4.Tobacco abuse/COPD-Taper nocoderm patch.Continue proventil/combivent.O2  5.HTN-Currently on no present meds.Blood pressure with some increased variables.Patient on HCTZ 12.5 daily and lopressor 25mg   HS prior to admission. Noted H/O STEMI. Improved  6. Pulmonary:    Added mucinex. Continue O2 per his baseline.  I've seen no changes in respiratory status. Has baseline bronchiectasis and collapse on left.    7.Labwork scheduled for Monday is normal so far.    LOS (Days) 13  FACE To FACE VISIT PEFORMED  Doloros Kwolek T 10/18/2011, 7:48 AM
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
## 2011-10-18 NOTE — Progress Notes (Signed)
Occupational Therapy Note  Patient Details  Name: Linzie Boursiquot MRN: [id number] Date of Birth: [birth year] Today's Date: 10/18/2011  Time: 730-830(54min) C/o pain "all over" RN made aware.  1:1 Pt praying when entered room. Pt oriented to self, place, time of day (witt visual cue of clock) and situation with min questioning cue. Pt unable to state day or month even with context cues of Christmas trees in room.  Engaged in cognitive activity with puzzle in a min distracting environment; pt demonstrated selective attention, increased sequencing, simple problem solving, working memory and procedural reasoning in this familiar task.  With functional ambulation from pt's room to dayroom (156feet) pt with SOB (dypsnea 3/4) on 3 liters of O2; stats were 91% once arrived in dayroom, pt needed an extended rest break to recover to then ambulation to rest room. Supervision in public restroom   Melonie Florida 10/18/2011, 8:39 AM

## 2011-10-18 NOTE — Progress Notes (Signed)
Patient slept throughout the night.  Woke up once to void, and went back to sleep.  Patient was calm, cooperative, and behave appropriately. Leland Johns, RN

## 2011-10-18 NOTE — Progress Notes (Signed)
Speech Pathology: Dysphagia Treatment Note  Group Session 1200-1220  Patient was observed with : Regular textures and Thin liquids.  Patient was noted to have s/s of aspiration : No:    Patient required: no cues to consistently follow precautions/strategies  Clinical Impression: SLP facilitated session with supervision level semantic cues to set up tray and attend to meal with alternating attention between meal and conversation.     Recommendations:  Continue with current orders and plan of care  Pain:   none Intervention Required:   No  Goals: Progressing  Fae Pippin, M.A., CCC-SLP [phone]

## 2011-10-18 NOTE — Progress Notes (Signed)
Physical Therapy Session Note  Patient Details  Name: [name] MRN: [id number] Date of Birth: 10-28-1932  Today's Date: 10/18/2011 Time: 4098-1191 Time Calculation (min): 60 min  Precautions: Precautions Precautions: Fall Precaution Comments: 3L O2 Required Braces or Orthoses: Yes Cervical Brace: Hard collar Restrictions Weight Bearing Restrictions: No  Short Term Goals: PT Short Term Goal 1: Pt will perform functional transfers with supervision PT Short Term Goal 1 - Progress: Met PT Short Term Goal 2: Pt will gait in controlled environment 150' wtih supervision PT Short Term Goal 2 - Progress: Met PT Short Term Goal 3: pt will demo emergent awareness with min A for functional tasks PT Short Term Goal 3 - Progress: Progressing toward goal PT Short Term Goal 4: pt will demonastrate dynamic standing balance for functional taks with supervision PT Short Term Goal 5: pt will demo selective attention to functional task x 5 minutes with supervision  Skilled Therapeutic Interventions:  Gait training without AD  with S, room <>gym x 125' focusing on upright posture, fluidity of movement, activity tolerance.  PT dyspneic  3/4 after 125'.  Therapeutic exercise for strengthening/balance - 10 x 1 calf raises and toe raises.  Therapeutic activities of tandem standing x 35 seconds with R foot in front, 55 seconds with L foot in front; standing on foam mat x 7 minutes while folding pillow cases and towels, demonstrating ST memory difficulty with continuity of strategy for folding items, but benefiting from min VCs.  Closed chain hip abduction in standing, 10 x 1 each L and R.  Tall kneeling on a mat hip for isolated flexion/extension, 10 x 1.     General Chart Reviewed: Yes Vital Signs Therapy Vitals Pulse Rate: 103  (after 125' amb) BP: 153/75 mmHg Oxygen Therapy SpO2: 96 % (after 125' amb) O2 Device: Nasal cannula O2 Flow Rate (L/min): 3 L/min Pain Pain Assessment Pain Assessment:  No/denies pain      Therapy/Group: Individual Therapy  Sheronica Corey 10/18/2011, 3:58 PM
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
## 2011-10-19 ENCOUNTER — Encounter (HOSPITAL_COMMUNITY): Payer: Self-pay | Admitting: Adult Health

## 2011-10-19 ENCOUNTER — Inpatient Hospital Stay (HOSPITAL_COMMUNITY): Payer: Medicare Other

## 2011-10-19 DIAGNOSIS — R0902 Hypoxemia: Secondary | ICD-10-CM

## 2011-10-19 DIAGNOSIS — J479 Bronchiectasis, uncomplicated: Secondary | ICD-10-CM

## 2011-10-19 MED ORDER — FLUTICASONE-SALMETEROL 250-50 MCG/DOSE IN AEPB
1.0000 | INHALATION_SPRAY | Freq: Two times a day (BID) | RESPIRATORY_TRACT | Status: DC
  Administered 2011-10-19 – 2011-10-21 (×4): 1 via RESPIRATORY_TRACT
  Filled 2011-10-19: qty 14
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
## 2011-10-19 NOTE — Progress Notes (Signed)
Patient has slept well in bed throughout the night. Got up twice to use the bathroom and quickly went back to sleep. Patient's wife has stayed the night on cot in the room. Was pleasantly confused when awake earlier in shift, although seemed slightly irritated by sitter and wife directing pt to lay in bed. Responded better to RN's approach. No complaints of pain this shift; given PRN xanax at 2100 after noting anxious behaviors (tapping feet & restlessness) & was effective.
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
## 2011-10-19 NOTE — Plan of Care (Signed)
Problem: RH Leisure Awareness Goal: LTG: Patient will participate in community (TR) LTG: Patient will participate in community reintegration activities to increase ability to identify and adapt to barriers, perform living skills, ambulate at supervision supervision level (TR)  Outcome: Not Applicable Date Met:  10/19/11 Goal discharged- not a focus during therapy sessions

## 2011-10-19 NOTE — Progress Notes (Signed)
Social Work   RN CM, Melanee Spry and I met yesterday with pt's wife and daughter, Inetta Fermo,  to review team conference and discuss discharge plans.  Provided both with info concerning patient's progress with therapy, behavioral issues, etc.  Family's primary concern has been pt's evening behavior.  Daughter points out that pt's history has been, with two prior hospitalizations, that he has had significant confusion and required restraints and that behavior normalized upon return home.  We stressed to both that this may in fact happen this time, yet need to keep in mind that he has new cognitive deficits he has related to his TBI.  We also discussed need for daughter and other family/ friends to monitor stress level on patient's wife as she is primary caregiver.   Daughter very responsive to info and discussion with wife sitting quietly but both are in agreement with plan for d/c home - family not able to provide needed 24 hour supervision on 12/13 (initial target date) due to prior obligation, therefore, d/c date changed to 10/21/11.   Wife did stay with patient last night to complete further education on behavioral management strategies. Family requested that f/u therapy be initiated as HH.  [name]

## 2011-10-19 NOTE — Progress Notes (Signed)
Physical Therapy Note  Patient Details  Name: [name] MRN: [id number] Date of Birth: September 07, 1932 Today's Date: 10/19/2011  4098-1191 Group treatment Pain:  No report of pain, just fatigue Skilled clinical intervention:  Patient seen today in [employer].  Patient's wife [name] present as well.  Patient interacted appropriately with all members of the group, laughing and sharing stories.  Patient paces himself during meals.  No dyspnea noted with this group activity.  [name] 10/19/2011, 4:31 PM

## 2011-10-19 NOTE — Progress Notes (Signed)
Speech Language Pathology Therapy Note  Patient Details  Name: Daquann Merriott MRN: [id number] Date of Birth: 1932-08-29  Today's Date: 10/19/2011 Time: 0900-1000 Time Calculation (min): 60 min  Precautions: Precautions Precautions: Fall Precaution Comments: 3L O2 Required Braces or Orthoses: Yes Cervical Brace: Hard collar Restrictions Weight Bearing Restrictions: No  Skilled Therapeutic Interventions: Treatment focus on functional problem solving, selective attention and family education.  Pt sequenced 4 and 6 step picture cards with Mod A question and visual cues. Selective attention to task in moderately distracting environment with supervision verbal cues.  Family education initiated with pt's wife in regards to current cognitive function and strategies to utilize at home. Pt's wife verbalized understanding but needs continued education.   Pain: No/Denies Pain  Therapy/Group: Individual Therapy  Brenyn Petrey 10/19/2011 11:55 AM

## 2011-10-19 NOTE — Progress Notes (Signed)
Speech Pathology: Dysphagia Treatment Note  Group Session  1130-1200  Patient was observed with : Regular textures and Thin liquids.  Patient was noted to have s/s of aspiration : No  Patient required: supervision cues to consistently follow precautions/strategies  Clinical Impression: SLP facilitated with supervision level semantic cues to set up tray dn utilize small bites and sips as safe swallow strategies.  Wife present to observe session.  Recommendations:  Continue with current orders and recommendations  Pain:   none Intervention Required:   No  Goals: Progressing   Fae Pippin, M.A., CCC-SLP [phone]

## 2011-10-19 NOTE — Progress Notes (Signed)
Occupational Therapy Note  Patient Details  Name: [name] MRN: [id number] Date of Birth: 1931-11-12 Today's Date: 10/19/2011  Time : [phone](77min) No c/o pain  1:1 Family education with pt and wife Patsy, on  Self care aspects at sink and shower level, energy conservation , wearing O2 at 3 liters at al times, activity tolerance with functional tasks, how to handle confusion during the day and/or night, how to cope with anxiety, how to help him orient during times of confusion, appropriate cuing to provide for him to complete ADL tasks, functional ambulation with management of O2 tubing.  Melonie Florida 10/19/2011, 11:29 AM

## 2011-10-19 NOTE — Progress Notes (Signed)
Physical Therapy Note  Patient Details  Name: [name] MRN: [id number] Date of Birth: Dec 29, 1931 Today's Date: 10/19/2011  Time: 1300-1400 60 minutes  Group Therapy  Pt participated in Walking group with focus on gait in controlled environment, obstacle negotiation, back and side stepping, stair climbing all with supervision.  Standing dynamic balance activities with supervision.  Pt demo'd increased SOB with exertion today.  O2 94% on 3L O2.  RN made aware.  Pt's wife educated on mobility status as supervision for home, need for 24 hour supervision and 3L O2 with all activities.  Wife verbalizes understanding.    [name] 10/19/2011, 3:18 PM

## 2011-10-20 NOTE — Progress Notes (Signed)
Speech Language Pathology Discharge Summary   Long term goals set: 7  Long term goals met: 7  Comments on progress toward goals: Pt has made functional progress and has met all LTG's this admission. Currently, pt is displaying behaviors consistent with a Rancho Level VI. Pt. Is overall Mod A for basic problem solving, working memory with utilization of compensatory strategies , selective attention and emergent awareness into deficits. Pt will d/c home with 24/7 supervision from family. Pt/family education ongoing. Pt would benefit from follow-up skilled home health SLP therapy to maximize cognitive function and overall independence and decrease caregiver burden of care.   Reasons goals not met: N/A  Equipment acquired: N/A  Reasons for discharge: treatment goals met and discharge from hospital  Follow-up: Home Health  Patient/family agrees with progress made and goals achieved: Yes  Din Bookwalter 10/20/2011

## 2011-10-20 NOTE — Progress Notes (Signed)
Therapeutic Recreation Discharge Summary Patient Details  Name: [name] MRN: [id number] Date of Birth: 1932-04-10  Long term goals set: 1  Long term goals met: 1  Comments on progress toward goals: Pt has made great progress toward goal meeting supervision level for TR tasks.  Pt is most limited by decreased cognition/safety requiring verbal cuing/supervision.  Pt is ready for d/c home.  Reasons for discharge: discharge from hospital  Patient/family agrees with progress made and goals achieved: Yes  [name] 10/20/2011, 5:20 PM

## 2011-10-20 NOTE — Progress Notes (Signed)
Per State Regulation 482.30 This chart was reviewed for medical necessity with respect to the patient's Admission/Duration of stay. Pt's medications have been monitored & adjusted as needed. Pt now w/ improved sleep, increased attention & more effective participation w/ therapies.  Wife has been in for family ed.  Discharge scheduled for 10/21/11.   Brock Ra                 Nurse Care Manager              Next Review Date: none

## 2011-10-20 NOTE — Progress Notes (Signed)
Occupational Therapy Note  Patient Details  Name: [name] MRN: [id number] Date of Birth: 1932/03/30 Today's Date: 10/20/2011  Time 10-11 ( ) No c/o pain  Noted: decreased dysnea today   1:1 Therapeutic activity: Focus on selective attention during a sorting, matching, working memory, following 1 step commands and organizing task (cleaning and sorting out a cabinet and countertops.) Pt's balance challenged dynamically with functional task with turning, getting down to half kneel to tall kneeling to sitting on the floor to back to standing with verbal cues for safety of O2 tubing and awareness of environmental aspects. These tasks simulated like work environment, he will attend with wife daily. No c/o of SOB with activity but took rest breaks prn.  Pt able to complete tasks with min to mod directive cues.   Melonie Florida 10/20/2011, 11:21 AM

## 2011-10-20 NOTE — Progress Notes (Signed)
Patient ID: [name], male   DOB: June 09, 1932, 77 y.o.   MRN: [id number] Patient ID: [name], male   DOB: October 18, 1932, 77 y.o.   MRN: [id number] Patient ID: [name], male   DOB: Sep 12, 1932, 77 y.o.   MRN: [id number] Subjective/Complaints: Review of Systems  Psychiatric/Behavioral: Positive for memory loss. The patient has insomnia.   All other systems reviewed and are negative.  Good night again.    Objective: Vital Signs: Blood pressure 138/73, pulse 67, temperature 97.8 F (36.6 C), temperature source Oral, resp. rate 18, height 5\' 11"  (1.803 m), weight 69.083 kg (152 lb 4.8 oz), SpO2 100.00%. Dg Chest 2 View  10/19/2011  *RADIOLOGY REPORT*  Clinical Data: Shortness of breath.  History of COPD.  CHEST - 2 VIEW  Comparison: 10/11/2011  Findings: The patient has extensive scarring in the left hemithorax with volume loss and prominent cystic bronchiectasis throughout the lung.  Heart and mediastinal structures are shifted to the left chronically.  Right lung is clear.  No acute osseous abnormality.  IMPRESSION: No acute abnormality.  Severe chronic changes in the left lung are stable.  Right lung is clear.  Original Report Authenticated By: Gwynn Burly, M.D.   No results found for this basename: WBC:2,HGB:2,HCT:2,PLT:2 in the last 72 hours No results found for this basename: NA:2,K:2,CL:2,CO2:2,GLUCOSE:2,BUN:2,CREATININE:2,CALCIUM:2 in the last 72 hours CBG (last 3)  No results found for this basename: GLUCAP:3 in the last 72 hours  Wt Readings from Last 3 Encounters:  10/19/11 69.083 kg (152 lb 4.8 oz)  09/28/11 74.844 kg (165 lb)  05/20/11 74.39 kg (164 lb)    Physical Exam:  General appearance: alert, no distress and slowed mentation Head: multiple bruises along left side of face Eyes: conjunctivae/corneas clear. PERRL, EOM's intact. Fundi benign. Ears: normal TM's and external ear canals both ears Nose: Nares normal. Septum midline. Mucosa normal. No drainage or sinus  tenderness. Throat: lips, mucosa, and tongue normal; teeth and gums normal Neck: no adenopathy, no carotid bruit, no JVD, supple, symmetrical, trachea midline and thyroid not enlarged, symmetric, no tenderness/mass/nodules Back: symmetric, no curvature. ROM normal. No CVA tenderness. Resp: Decreased sounds/ronchi/rales/wheezes in entire left lung field. Cardio: regular rate and rhythm, S1, S2 normal, no murmur, click, rub or gallop GI: soft, non-tender; bowel sounds normal; no masses,  no organomegaly Extremities: extremities normal, atraumatic, no cyanosis or edema Pulses: 2+ and symmetric Skin: Skin color, texture, turgor normal. No rashes or lesions bruising improving. Neurologic: Alert. Oriented to name only.  Insight remains limited. Exam non focal during my exam while pt seated.  Oriented to name and place given cues only.  Exam can wax and wane.     Assessment/Plan: 1. Functional deficits secondary to TBI and transverse process fracture C7/T1  which require 3+ hours per day of interdisciplinary therapy in a comprehensive inpatient rehab setting. Physiatrist is providing close team supervision and 24 hour management of active medical problems listed below. Physiatrist and rehab team continue to assess barriers to discharge/monitor patient progress toward functional and medical goals.  Family ed and dipso planning  Mobility: Bed Mobility Bed Mobility: Yes Right Sidelying to Sit: 3: Mod assist Right Sidelying to Sit Details (indicate cue type and reason): c/o dizziness with laying down Transfers Transfers: Yes Sit to Stand: 6: Modified independent (Device/Increase time);Without upper extremity assist Stand Pivot Transfers - DO NOT USE: 4: Min assist Stand Pivot Transfer Details (indicate cue type and reason): steady A for balance and attention to functional task Ambulation/Gait Ambulation/Gait  Assistance: 5: Supervision Ambulation/Gait Assistance Details (indicate cue type and  reason): standing rest breaks d/t fatigue Ambulation Distance (Feet): 150 Feet Assistive device:  (no AD) Gait Pattern: Decreased step length - right;Decreased step length - left Gait velocity: slow, pt wanting to hold handrail Stairs: No Stairs Assistance: 4: Min assist Stairs Assistance Details (indicate cue type and reason): cues for foot placement on stairs, cues for safety Stair Management Technique: Two rails Number of Stairs: 5  Wheelchair Mobility Wheelchair Mobility: No ADL:   Cognition: Cognition Overall Cognitive Status: Impaired Arousal/Alertness: Awake/alert Orientation Level: Other (Comment) (orientation fluctuates; periods of confusion) Attention: Sustained Focused Attention: Appears intact Focused Attention Impairment: Verbal basic;Functional basic Sustained Attention: Appears intact Sustained Attention Impairment: Verbal basic;Functional basic Selective Attention: Impaired (internally distracted, encouragment to participate) Selective Attention Impairment: Functional basic;Verbal basic Alternating Attention: Impaired Alternating Attention Impairment: Verbal basic;Functional basic Divided Attention: Impaired (naming xmas items while doing Nustep w/ A) Divided Attention Impairment: Verbal complex;Functional complex Memory:  (decreased working memory for sequencing folding towels) Memory Impairment: Storage deficit;Decreased recall of new information;Decreased short term memory Decreased Short Term Memory: Verbal basic Awareness: Impaired Awareness Impairment: Intellectual impairment (doesnt state cognitive deficits) Problem Solving: Impaired Problem Solving Impairment: Verbal basic;Functional basic Executive Function: Reasoning;Sequencing;Organizing;Decision Making;Initiating;Self Monitoring;Self Correcting Reasoning: Impaired Reasoning Impairment: Verbal basic;Functional basic Sequencing: Impaired Sequencing Impairment: Verbal basic;Functional  basic Organizing: Impaired Organizing Impairment: Verbal basic;Functional basic Decision Making: Impaired Decision Making Impairment: Verbal basic;Functional basic Initiating: Impaired Initiating Impairment: Verbal basic;Functional basic Self Monitoring: Impaired Self Monitoring Impairment: Verbal basic;Functional basic Self Correcting: Impaired Self Correcting Impairment: Verbal basic;Functional basic Behaviors: Impulsive;Lability;Confabulation Safety/Judgment: Impaired Comments: did not try to move unassisted; held rail for stability when kicking without cue [facility] Scales of Cognitive Functioning: Confused/appropriate Cognition Arousal/Alertness: Awake/alert Orientation Level: Other (Comment) (orientation fluctuates; periods of confusion)   1.TBI with transverse process fracture C7/T1-PT/OT/SLP eval and treat.Cervical collar x 6 weeks  1. DVT Prophylaxis/Anticoagulation: S.Q lovenox-Monitor platelet counts and any bleeding episodes.Follow up labs ok. Resumed ASA 2. Pain Management: Norco prn.Monitor mental status   3. Mood/sleep/dementia:  Still sundowning but sleep better yesterday.Ophelia Charter currently at bedside.Discontinue haldol.Continue xanax/ativan prn.may need scheduled med for restlessness.Patient on Xanax 0.5mg  daily PTA for H/O anxiety. There is a report of premorbid dementia, and after speaking with team/family this is likely playing a role here as well.  -Seroquel: 100mg  qhs with back up dose.  -May need to sleep in chair, as it sounds like he did that home and doesn't like to lay down or have feet up! Encourage "bed" sleeping as possible Overall sleep has improved.  4.Tobacco abuseTaper nocoderm patch.  5.HTN-Currently on no present meds.Blood pressure with some increased variables.Patient on HCTZ 12.5 daily and lopressor 25mg   HS prior to admission. Noted H/O STEMI. Improved  6. Pulmonary:    Guaifenesin, proventil-- O2 per his baseline.  I've seen no  changes in respiratory status. Has baseline bronchiectasis and collapse on left.  He is not a great historian given cognitive deficits but has denied to me that he's more short of breath.  Family reports fluctuation at home  -appreciate pulmonology recs: advair and flutter valve added  7.Labwork: all within normal limits    LOS (Days) 15  FACE To FACE VISIT PEFORMED  Yisrael Obryan T 10/20/2011, 9:13 AM

## 2011-10-20 NOTE — Progress Notes (Signed)
Occupational Therapy Session Note  Patient Details  Name: Tadao Emig MRN: [id number] Date of Birth: 25-Jan-1932  Today's Date: 10/20/2011 Time: 1191-4782 Time Calculation (min): 45 min  Precautions: Precautions Precautions: Fall Precaution Comments: O2 - activity tolerance Required Braces or Orthoses: Yes Cervical Brace: Applied in supine position;Hard collar (Aspen) Restrictions Weight Bearing Restrictions: No  Short Term Goals: OT Short Term Goal 1: Pt will don UB clothing with setup OT Short Term Goal 2: Pt will don LB clothing with setup OT Short Term Goal 3: Pt will perform toileting with distant S OT Short Term Goal 4: Pt will perform bed mobility in prep for ADL task mod I OT Short Term Goal 5: Pt will demonstrated selective attention with min cuing during 30 min of functional activity  Skilled Therapeutic Interventions/Progress Updates:    ADL retraining including bathing at walk-in shower level and dressing with sit to stand from recliner in room.  Pt required min verbal cues/encouragement to initiate bathing/dressing sequence but completed all tasks with supervision only for safety.  Pt oriented to situation and place only (hospital).  O2 sats >90% on 3L O2.  Focus on safety, task initiation, activity tolerance, and dynamic standing balance.     Pain Pain Assessment Pain Assessment: No/denies pain  Therapy/Group: Individual Therapy  Rich Brave 10/20/2011, 12:08 PM

## 2011-10-20 NOTE — Progress Notes (Signed)
Occupational Therapy Discharge Summary  Patient Details  Name: Dru Primeau MRN: [id number] Date of Birth: 14-Aug-1932 Today's Date: 10/20/2011  Patient has met 13 of 13 long term goals due to improved activity tolerance, improved balance, improved attention, improved awareness and improved coordination.  Pt can perform basic and familiar ADLs with supervision on 3 liter of O2 with rest breaks as needed for fatigue. Patient's wife and other family members can requires assistance and understands how to provide 24 hr A to pt, wife will have support of other family members to provide the necessary physical and cognitive assistance at discharge.  Family plans to get settled at home and then bring him to their mechanic shop in order to provide 24 hr A.  Pt's behavior is consistent with a Rancho Level VI.  Wife and daughter also educated on his aspen collar care from nursing.  Recommendation:  Patient will continue to received OT services in a home health setting to continue to advance functional skills in the area of ADL.  Equipment: No equipment provided  Patient/family agrees with progress made and goals achieved: Yes   Melonie Florida 10/20/2011, 11:35 AM

## 2011-10-20 NOTE — Progress Notes (Signed)
Physical Therapy Session Note  Patient Details  Name: [name] MRN: [id number] Date of Birth: Jan 19, 1932  Today's Date: 10/20/2011 Time: 1027-2536 Time Calculation (min): 57 min  Precautions: Precautions Precautions: Fall Precaution Comments: O2 - activity tolerance Required Braces or Orthoses: Yes Cervical Brace: Hard collar Restrictions Weight Bearing Restrictions: No  Skilled Therapeutic Interventions/Progress Updates:    Pt participated in gait group, focusing on gait, steps, dynamic standing balance and car transfers.  Pt performed gait 300+' in hospital environment with supervision.  Pt would choose to stand during rest breaks with independence.  Ascended and descended 15 steps with close supervision with 2 rails.  Car transfer to truck height with supervision.  Completed objective discharge information.  Pt verbalized readiness for d/c home.  Upon returning to room after treatment pt seemed confused about session being over and therapist leaving, otherwise during treatment pt had seemed completely oriented.  Pain Pain Assessment Pain Assessment: No/denies pain Mobility Transfers Sit to Stand: 5: Supervision Stand to Sit: 5: Supervision Stand Pivot Transfer Details (indicate cue type and reason): distant suprevision Locomotion  Ambulation Ambulation: Yes Ambulation/Gait Assistance: 5: Supervision Ambulation Distance (Feet): 300 Feet Stairs / Additional Locomotion Stairs: Yes Stairs Assistance: 5: Supervision Stair Management Technique: Two rails Number of Stairs: 15  Height of Stairs: 6  Wheelchair Mobility Wheelchair Mobility: No  Balance Static Standing Balance Static Standing - Level of Assistance: 5: Stand by assistance Dynamic Standing Balance Dynamic Standing - Level of Assistance: 5: Stand by assistance    Therapy/Group: Group Therapy  [name] 10/20/2011, 3:10 PM

## 2011-10-20 NOTE — Progress Notes (Signed)
Pt alert and confused at times.  Pt denies pain.  Pt ambulates with supervision.  Pt performs all toilet hygiene independently  Pt has sitter at bedside.  Pt is continent of urine  [name]

## 2011-10-20 NOTE — Progress Notes (Signed)
Occupational Therapy Note  Patient Details  Name: [name] MRN: [id number] Date of Birth: [birth year] Today's Date: 10/20/2011  Time:1130 - 1200 30 min Pain - none  Skilled Intervention: Seen in [employer]. Min vc for BUE integrated use during self feeding. Socially appropriate. Min vc to increase intake.  Group session   Leahmarie Gasiorowski,HILLARY 10/20/2011, 4:42 PM

## 2011-10-20 NOTE — Progress Notes (Signed)
Patient Details  Name: [name] MRN: [id number] Date of Birth: [birth year]  Today's Date: 10/20/2011 Time:9-9:30  Skilled Therapeutic Interventions/Progress Updates: Cognitive rehab/pathfinding/simple kitchen task with supervision ambulatory/standing level with min cues.  Discussed d/c planning in reference to usage of time.  Pt stated independently that he could do simple tasks around the house, but no outside work. No c/o pain.  Therapy/Group: Other: co-treat with ST  Activity Level: Simple:  Level of assist: Supervision  [name] 10/20/2011, 9:56 AM

## 2011-10-20 NOTE — Progress Notes (Signed)
Speech Language Pathology Therapy Note  Patient Details  Name: [name] MRN: [id number] Date of Birth: 1932/04/03  Today's Date: 10/20/2011 Time: 0900-1000 Time Calculation (min): 60 min  Precautions: Precautions Precautions: Fall Precaution Comments: O2 - activity tolerance Required Braces or Orthoses: Yes Cervical Brace: Applied in supine position;Hard collar (Aspen) Restrictions Weight Bearing Restrictions: No  Skilled Therapeutic Interventions: Treatment focus on working memory with utilization of external aids in the environment, following directions, attention and problem solving. Pt able to navigate to kitchen from room with use of signs with Mod A question cues.  Followed 2 step directions with Mod A verbal and visual cues to locate items in the kitchen. Selective attention to task for ~ 30 mins with Min A-Supervision cues for redirection. Sequenced 6-step picture cards with Mod A question and visual cues. Pt oriented X 4 with Mod A verbal cues to utilize calendar to recall date and recall discharge date home.   Precautions/Restrictions  Precautions Precautions: Fall Precaution Comments: O2 - activity tolerance Required Braces or Orthoses: Yes Cervical Brace: Applied in supine position;Hard collar ([place]en) Restrictions Weight Bearing Restrictions: No  Pain Pain Assessment Pain Assessment: No/denies pain  Oral/Motor: Oral Motor/Sensory Function Labial ROM: Within Functional Limits Labial Symmetry: Within Functional Limits Labial Strength: Within Functional Limits Labial Sensation: Within Functional Limits Lingual ROM: Within Functional Limits Lingual Symmetry: Within Functional Limits Lingual Strength: Within Functional Limits Lingual Sensation: Within Functional Limits Facial ROM: Within Functional Limits Facial Symmetry: Within Functional Limits Facial Strength: Within Functional Limits Facial Sensation: Within Functional Limits Velum: Within Functional  Limits Mandible: Within Functional Limits Motor Speech Respiration: Within functional limits Phonation: Normal Resonance: Within functional limits Articulation: Within functional limitis Intelligibility: Intelligible Motor Planning: Witnin functional limits Motor Speech Errors: Not applicable Comprehension: Auditory Comprehension Yes/No Questions: Within Functional Limits Commands: Impaired Two Step Basic Commands: 50-74% accurate Multistep Basic Commands: 0-24% accurate Conversation: Simple Other Conversation Comments: Tangential, confabulatory and confused requiring total assist verbal redirection and therapeutic techniques to reorient. Interfering Components: Attention;Working Radio broadcast assistant: Careers information officer: Within [employer] Reading Comprehension Reading Status: Not tested Expression: Expression Primary Mode of Expression: Verbal Verbal Expression Initiation: No impairment Automatic Speech: Name;Social Response Level of Generative/Spontaneous Verbalization: Conversation Repetition: No impairment Naming: No impairment Pragmatics: Impairment Impairments: Topic appropriateness;Topic maintenance Interfering Components: Attention;Premorbid deficit Effective Techniques: Semantic cues Non-Verbal Means of Communication: Not applicable Written Expression Dominant Hand: Right Written Expression: Not tested  Therapy/Group: Individual Therapy  Leonard Feigel 10/20/2011 11:45 AM
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
## 2011-10-20 NOTE — Progress Notes (Signed)
Speech Pathology: Dysphagia Treatment Note  Group Session Time: 1200-1220  Patient was observed with : Regular textures and Thin liquids.  Patient was noted to have s/s of aspiration : No  Patient required: minimal assist semantic cues to consistently follow precautions/strategies  Clinical Impression: SLP facilitated with minimal assist semantic cues to encourage p.o. and problem solve with set up and use of utensils.  Recommendations:  Continue with current orders and plan of care   Pain:   none Intervention Required:   No  Goals: Progressing  Fae Pippin, M.A., CCC-SLP [phone]

## 2011-10-21 DIAGNOSIS — F0390 Unspecified dementia without behavioral disturbance: Secondary | ICD-10-CM

## 2011-10-21 DIAGNOSIS — S069X9A Unspecified intracranial injury with loss of consciousness of unspecified duration, initial encounter: Secondary | ICD-10-CM

## 2011-10-21 DIAGNOSIS — Z5189 Encounter for other specified aftercare: Secondary | ICD-10-CM

## 2011-10-21 MED ORDER — FLUTICASONE-SALMETEROL 250-50 MCG/DOSE IN AEPB: 1.0000 | INHALATION_SPRAY | Freq: Two times a day (BID) | RESPIRATORY_TRACT | Status: DC

## 2011-10-21 NOTE — Progress Notes (Signed)
Physical Therapy Discharge Summary  Patient Details  Name: Keigen Caddell MRN: [id number] Date of Birth: 1932/05/29 Today's Date: 10/21/2011  Patient has met 8 of 8 long term goals due to improved activity tolerance, improved balance, increased strength and ability to compensate for deficits.  Patient to discharge at an ambulatory level Supervision.   Patient's care partner is independent to provide the necessary cognitive assistance at discharge.  Recommendation:  Patient will benefit from ongoing skilled PT services in home health setting to continue to advance safe functional mobility, address ongoing impairments in activity tolerance, gait and balance, and minimize fall risk.  Equipment: No equipment provided  Patient/family agrees with progress made and goals achieved: Yes  Sophia Cubero 10/21/2011, 9:18 AM

## 2011-10-21 NOTE — Progress Notes (Signed)
Social Work  Discharge Note  The overall goal for the admission was met for:   Discharge location: Yes  - home with wife and family to provide 24 hr per day supervision  Length of Stay: Yes -16 days  Discharge activity level: Yes - supervision  Home/community participation: Yes - supervision  Services provided included: MD, RD, PT, OT, SLP, RN, CM, TR, Pharmacy and SW  Financial Services: Medicare and Private Insurance: BCBS  Follow-up services arranged: Home Health: RN, OT, ST and CNA via [facility] and Patient/Family has no preference for HH/DME agencies  Comments (or additional information): In addition to the above services, a follow up appt. With pt's primary care physician, Dr. Manus Gunning, was scheduled for 11/03/11 @ 2:00.  Reviewed and provided written info to daughter and wife about several community resources they may need to access:  Local Caregiver Support Groups,  [place] [facility], local Adult Water quality scientist.  Patient/Family verbalized understanding of follow-up arrangements: Yes  Individual responsible for coordination of the follow-up plan: wife and daughter  Confirmed correct DME delivered: NA  [name]
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
## 2011-10-21 NOTE — Progress Notes (Signed)
Patient discharge to home at 1130 with wife and daughter. Patient reports he is feeling good. o2 sat at 96% on 2l/min [place] . Educated patient's daughter how to apply c-collar and change pads. Patient 's daughter verbalized understanding and demonstrating with staff assist to apply c-collar. discharge instructions given and reviewed with family by D. Anguiilli , PA.  Patient and family verbalized understanding of discharge instructions.            Cleotilde Neer

## 2011-11-14 ENCOUNTER — Ambulatory Visit (INDEPENDENT_AMBULATORY_CARE_PROVIDER_SITE_OTHER): Payer: Medicare Other | Admitting: Adult Health

## 2011-11-14 ENCOUNTER — Encounter: Payer: Self-pay | Admitting: Adult Health

## 2011-11-14 DIAGNOSIS — J479 Bronchiectasis, uncomplicated: Secondary | ICD-10-CM

## 2011-11-14 NOTE — Patient Instructions (Signed)
Continue on same meds.  follow up Dr. Delford Field  In 2-3 months and As needed

## 2011-11-17 DIAGNOSIS — Z0271 Encounter for disability determination: Secondary | ICD-10-CM
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
## 2011-12-05 ENCOUNTER — Encounter: Payer: Medicare Other | Attending: Physical Medicine & Rehabilitation | Admitting: Physical Medicine & Rehabilitation

## 2011-12-05 DIAGNOSIS — J4489 Other specified chronic obstructive pulmonary disease: Secondary | ICD-10-CM | POA: Insufficient documentation

## 2011-12-05 DIAGNOSIS — S069X9A Unspecified intracranial injury with loss of consciousness of unspecified duration, initial encounter: Secondary | ICD-10-CM

## 2011-12-05 DIAGNOSIS — J449 Chronic obstructive pulmonary disease, unspecified: Secondary | ICD-10-CM | POA: Insufficient documentation

## 2011-12-05 DIAGNOSIS — Z87820 Personal history of traumatic brain injury: Secondary | ICD-10-CM | POA: Insufficient documentation

## 2011-12-05 DIAGNOSIS — F0390 Unspecified dementia without behavioral disturbance: Secondary | ICD-10-CM | POA: Insufficient documentation

## 2011-12-05 DIAGNOSIS — F341 Dysthymic disorder: Secondary | ICD-10-CM

## 2011-12-05 NOTE — Assessment & Plan Note (Signed)
[name] is back regarding his traumatic brain injury.  He was discharged to home about 6 weeks ago.  He has been at home with his wife.  He is independent with most simple basic hygiene tasks.  He does need supervision occasionally for safety.  He is able to prepare simple meals and make coffee at home.  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 He does not have functional roll over there at the facility.  His mood has been generally controlled.  She states that the hands were bit rough initially for the first week or two, but now he is settled in.  He is using the Seroquel at night as well as Xanax during the day, and Ativan also is on his regimen now.  Breathing has been stable.  He uses oxygen as needed during the day and at nighttime.  REVIEW OF SYSTEMS:  Notable for the above.  Full 12-point review is in the written health and history section of the chart.  SOCIAL HISTORY:  The patient is married, living with his wife.  She is present with him today.  Wife needs physical assistance of her own.  PHYSICAL EXAMINATION:  VITAL SIGNS:  Blood pressure is 141/75, pulse 91, respiratory rate 16, he is satting 93% on room air. GENERAL:  The patient is pleasant, alert. NEUROLOGIC:  He is not oriented to place, reason, or date.  He is oriented to his name.  He can follow simple commands.  He has poor insight and awareness.  Often loses track during a thought.  He did provide some insightful commentary regarding his mood and irritability that he has at times. MUSCULOSKELETAL: Strength is generally 5/5.  He tends to shuffle the right leg still a bit with gait, but is for the most part stable.  He uses no cane or adaptive device for balance. HEART:  Regular. CHEST:  Clear. ABDOMEN:  Soft, nontender.  The weight seemed to be stable.  ASSESSMENT: 1. Traumatic brain injury with transverse process fractures as well. 2.  Chronic obstructive pulmonary disease. 3. Baseline dementia.  PLAN: 1. Wife was asking about cognitive testing at some point, and I would     wait another 2-3 months before pursuing any cognitive testing to     determine his cognitive level.  Obviously, there was premorbid     dementia and his head injury will not help matters there.  I would     expect some interval improvement over the next 2-3 months however.     She had questions about medications for memory and we discussed     potential options however given his new injury and chronic     cognitive deficits likelihood they do not have dramatic benefits     with medications such as Aricept or Exelon is not tremendous.     Certainly may be with a reason to try at some point, if they want     to pursue. 2. Continue working on sleep, appropriate diet, limitation of neuro     sedating medications.  The patient states that     he needs the Xanax to stay calm during the day. 3. I will see him back here on an as-needed basis.  Questions were     encouraged and answered today at this visit.     Ranelle Oyster, M.D. Electronically Signed    ZTS/MedQ D:  12/05/2011 12:56:16  T:  12/05/2011 16:10:96  Job #:  [id number]  cc:   Bryan Lemma. Manus Gunning, M.D. Fax: [phone]

## 2012-04-13 ENCOUNTER — Other Ambulatory Visit: Payer: Self-pay | Admitting: Family Medicine

## 2012-04-13 ENCOUNTER — Ambulatory Visit
Admission: RE | Admit: 2012-04-13 | Discharge: 2012-04-13 | Disposition: A | Discharge status: Home / Self Care | Payer: Medicare Other | Source: Ambulatory Visit | Attending: Family Medicine | Admitting: Family Medicine

## 2012-04-13 DIAGNOSIS — R05 Cough: Secondary | ICD-10-CM

## 2012-04-26 IMAGING — CR DG CHEST 1V PORT
1 series · 1 of 1 positions shown · non-contrast
Comparison: 05/08/2011

CLINICAL DATA: Respiratory distress

PORTABLE CHEST - 1 VIEW

[AP]
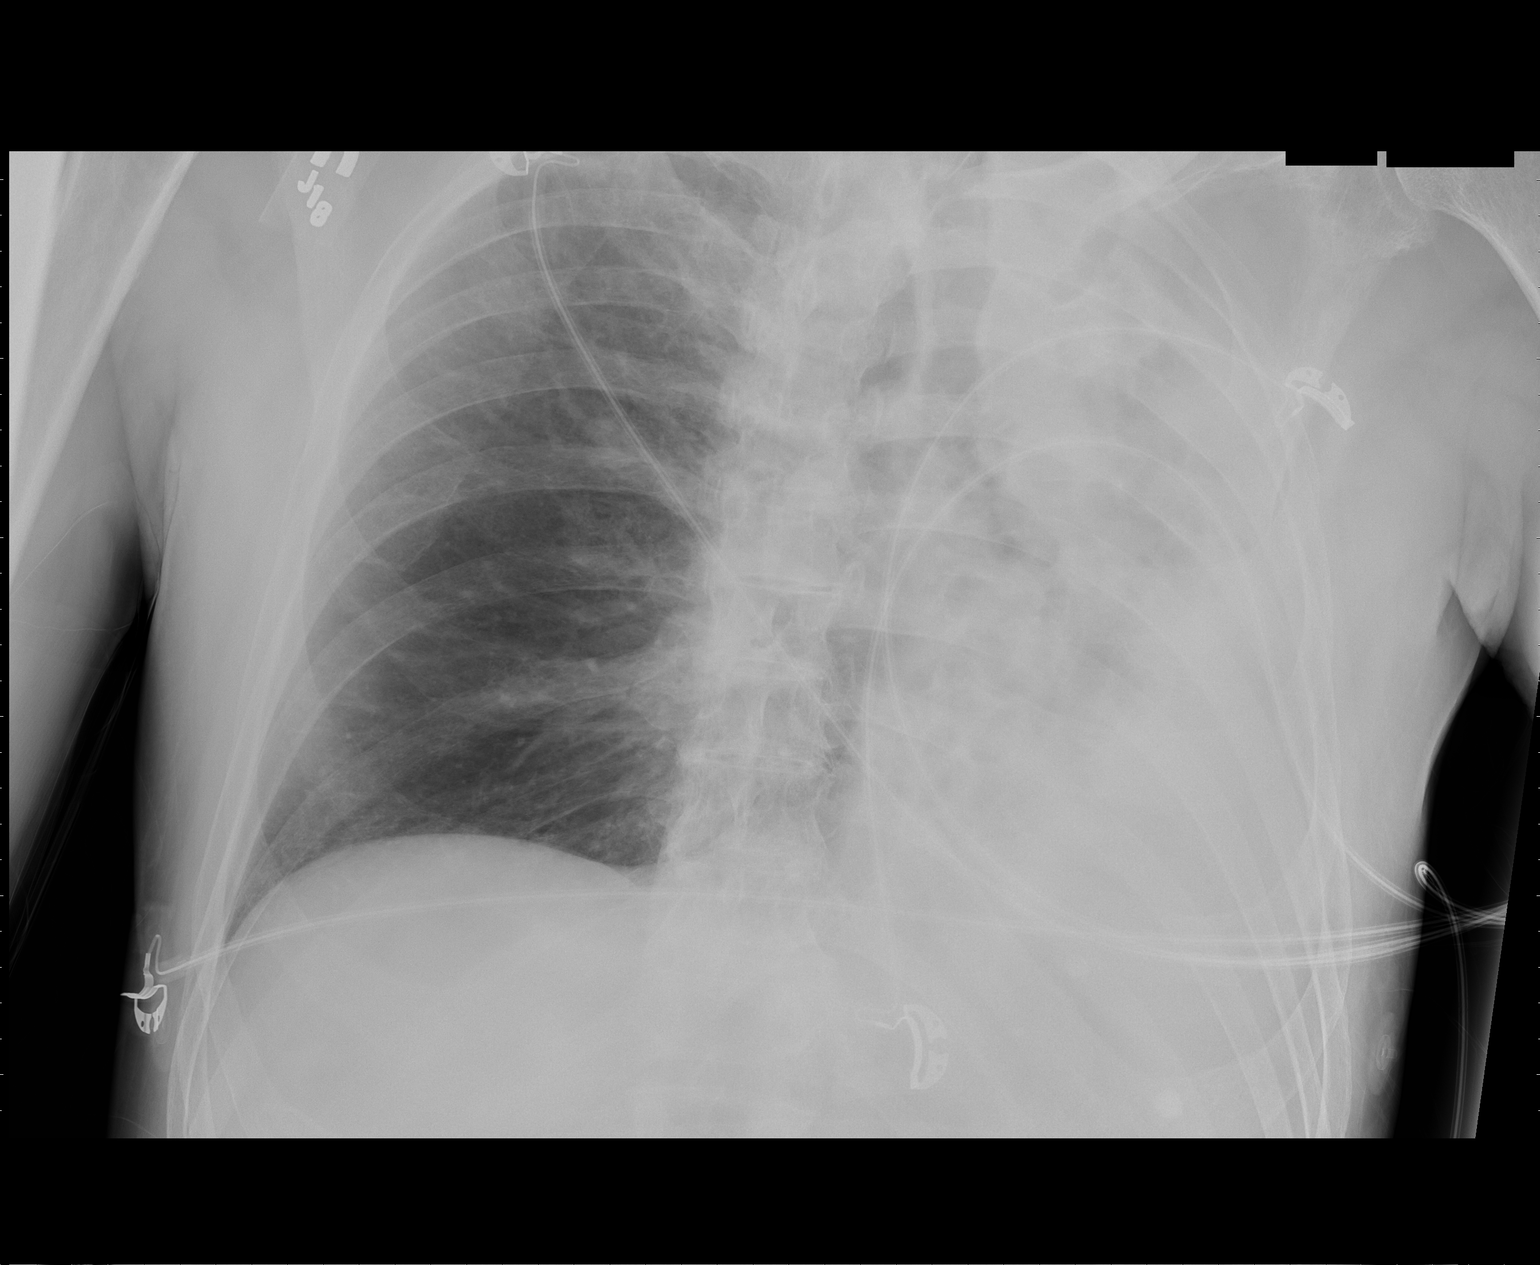

[1 of 1 positions shown; findings below may reference images not displayed]

FINDINGS: No significant change in the opacification of the left
lung.

Left lung bronchiectasis similar to previous exam.  Improvement in
patchy parenchymal opacities within the right lung.

The bony thorax appears intact.
IMPRESSION: 1.  No change in opacification of the left lung.
2.  Improvement in patchy opacities within the right lung.

## 2012-04-29 IMAGING — CR DG CHEST 1V PORT
1 series · 1 of 1 positions shown · non-contrast
Comparison: 05/11/2011.

CLINICAL DATA: Postoperative radiograph.  Weakness and shortness of
breath.

PORTABLE CHEST - 1 VIEW

[lat decub chest]
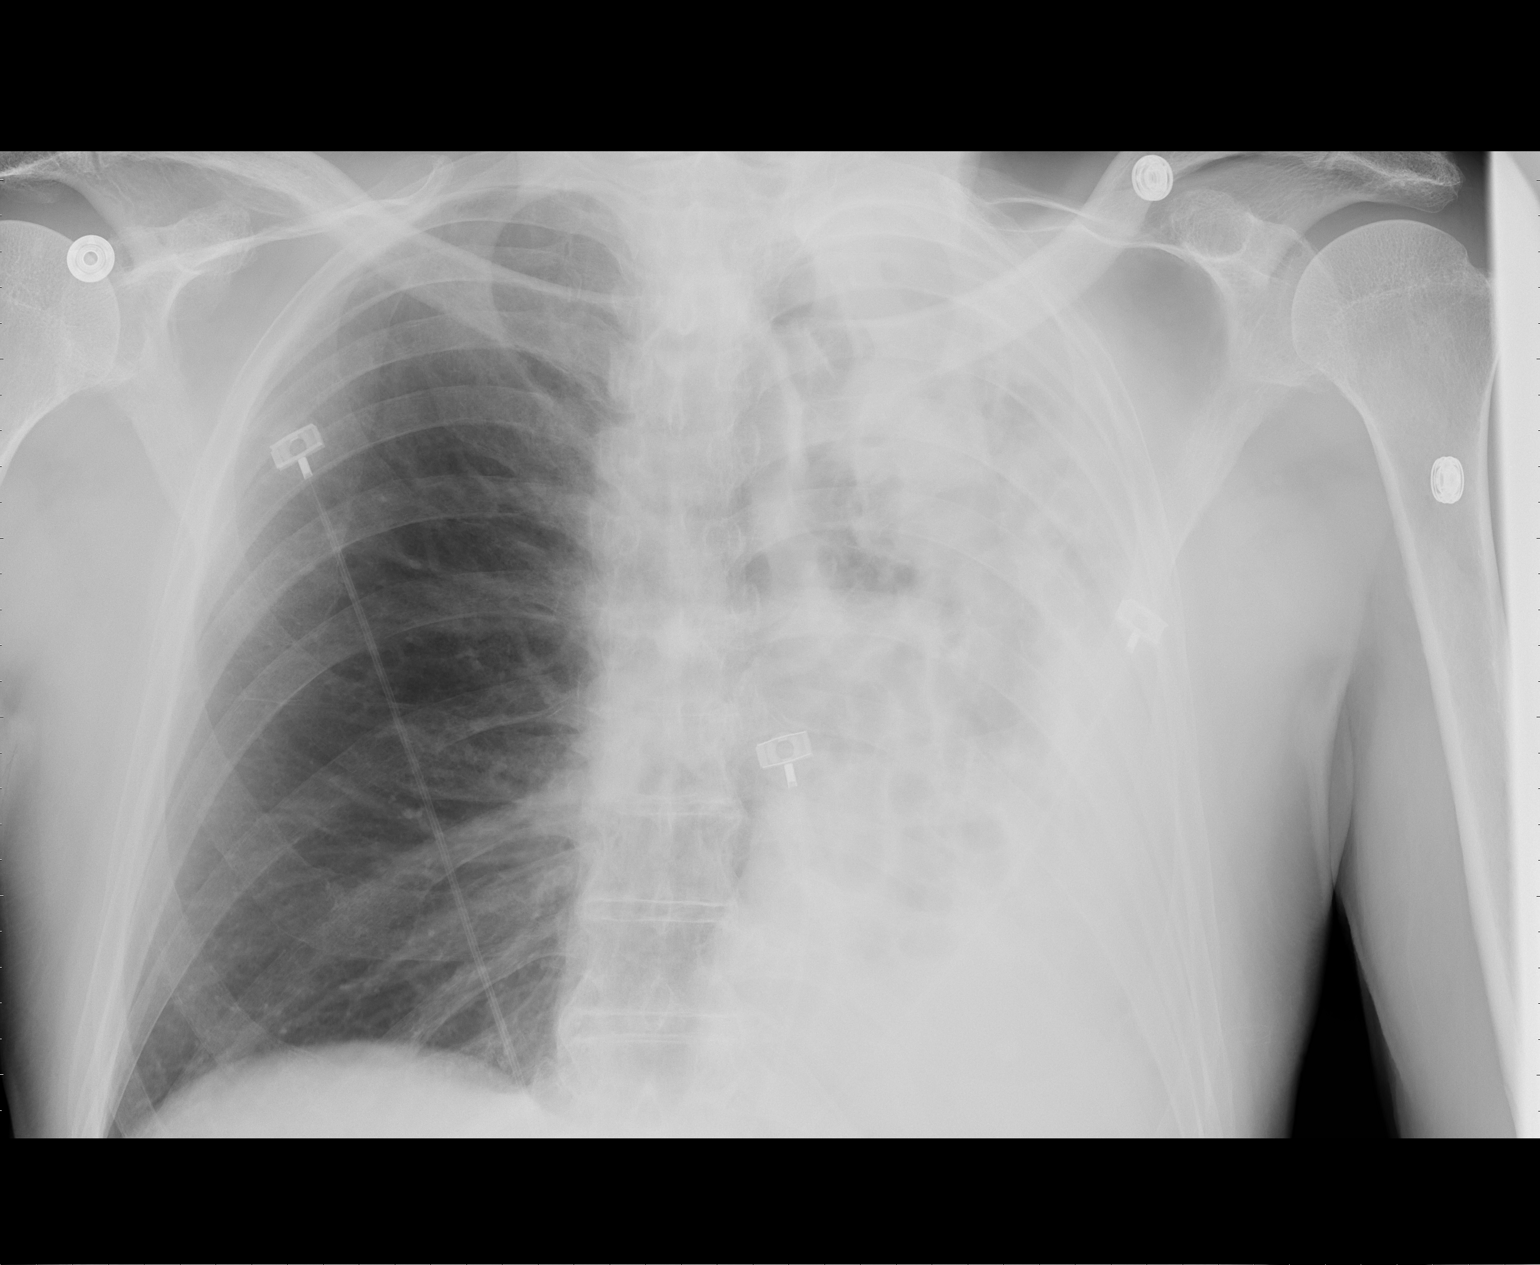

[1 of 1 positions shown; findings below may reference images not displayed]

FINDINGS: Complete opacification of the left hemithorax with volume
loss and shift of the mediastinal structures and trachea into the
left hemithorax.  The right lung is clear.  No pleural effusion or
pneumothorax on the right.  Cannot evaluate the cardiomediastinal
structures as there are contours are obscured.
IMPRESSION: No significant interval change.  Left hemithorax opacification and
volume loss with mediastinal shift.

## 2012-05-07 IMAGING — CR DG CHEST 2V
2 series · 2 of 2 positions shown · non-contrast
Comparison: 05/12/2011 and CT 05/05/2011

CLINICAL DATA: Follow-up left pneumothorax.

CHEST - 2 VIEW

[view not recorded (1 of 2)]
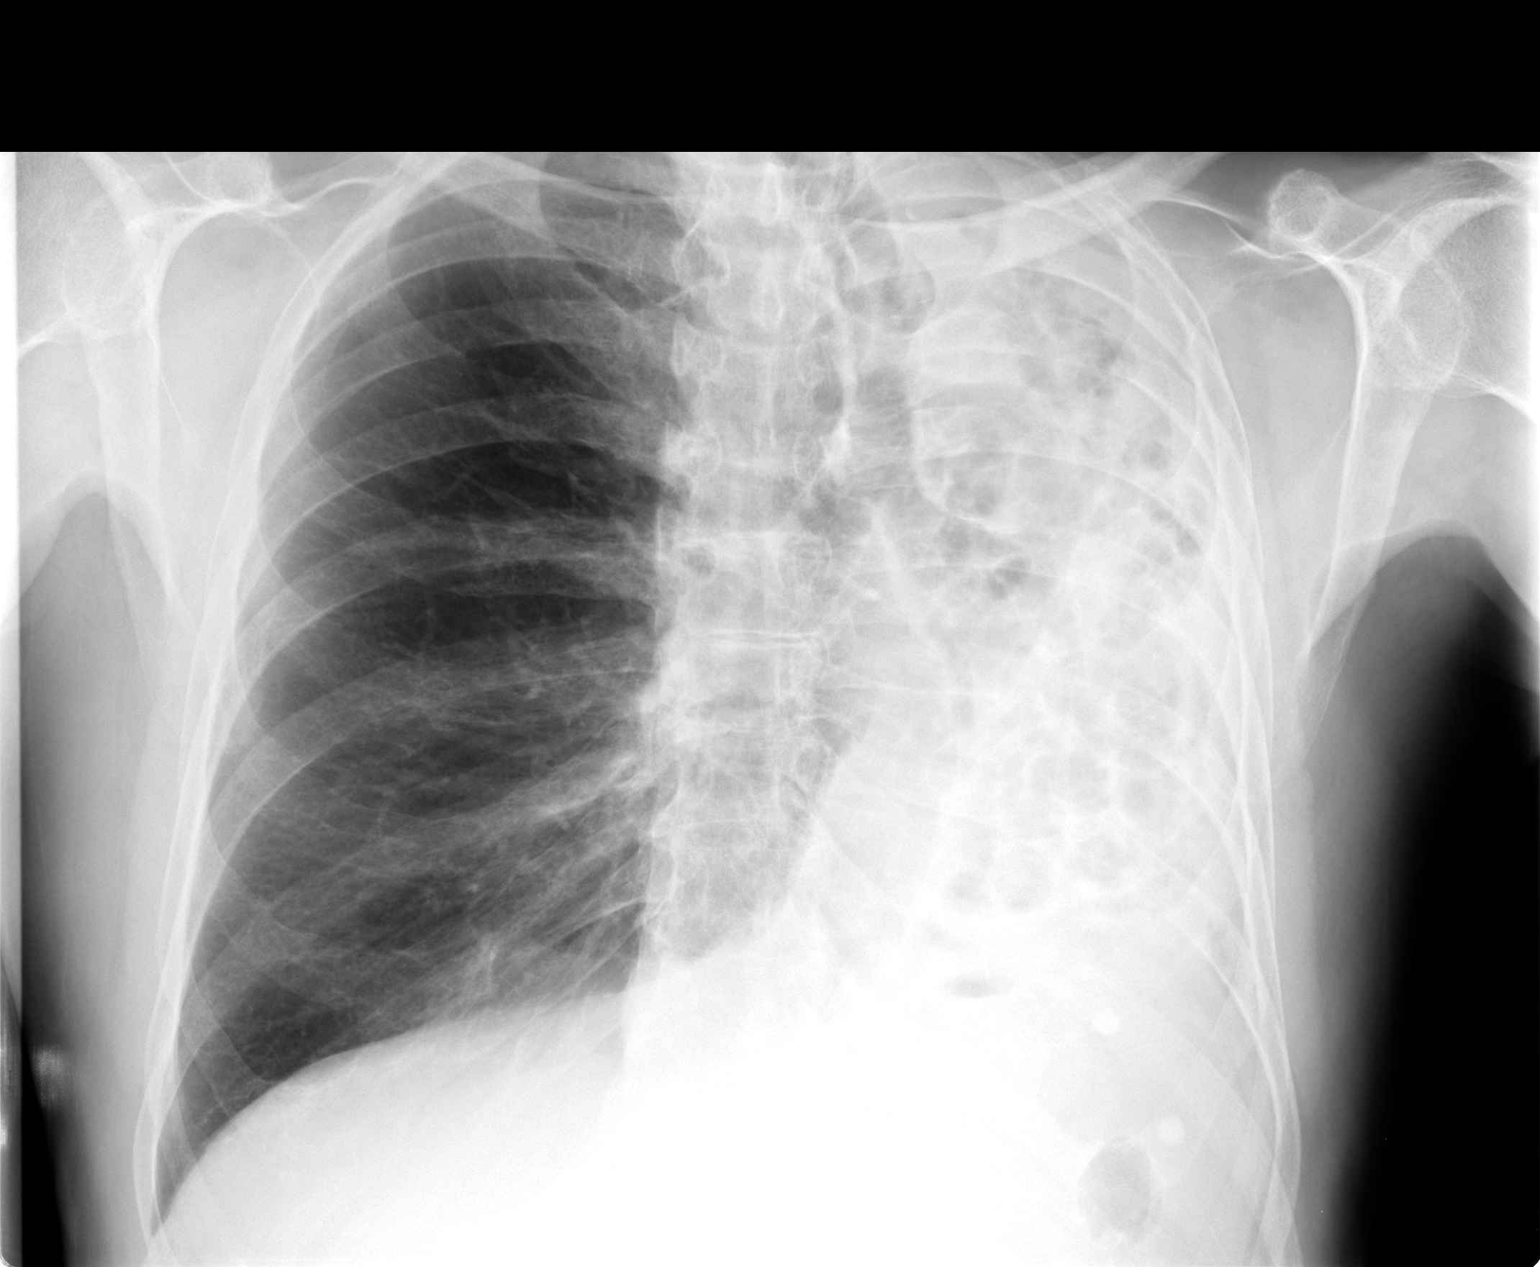

[view not recorded (2 of 2)]
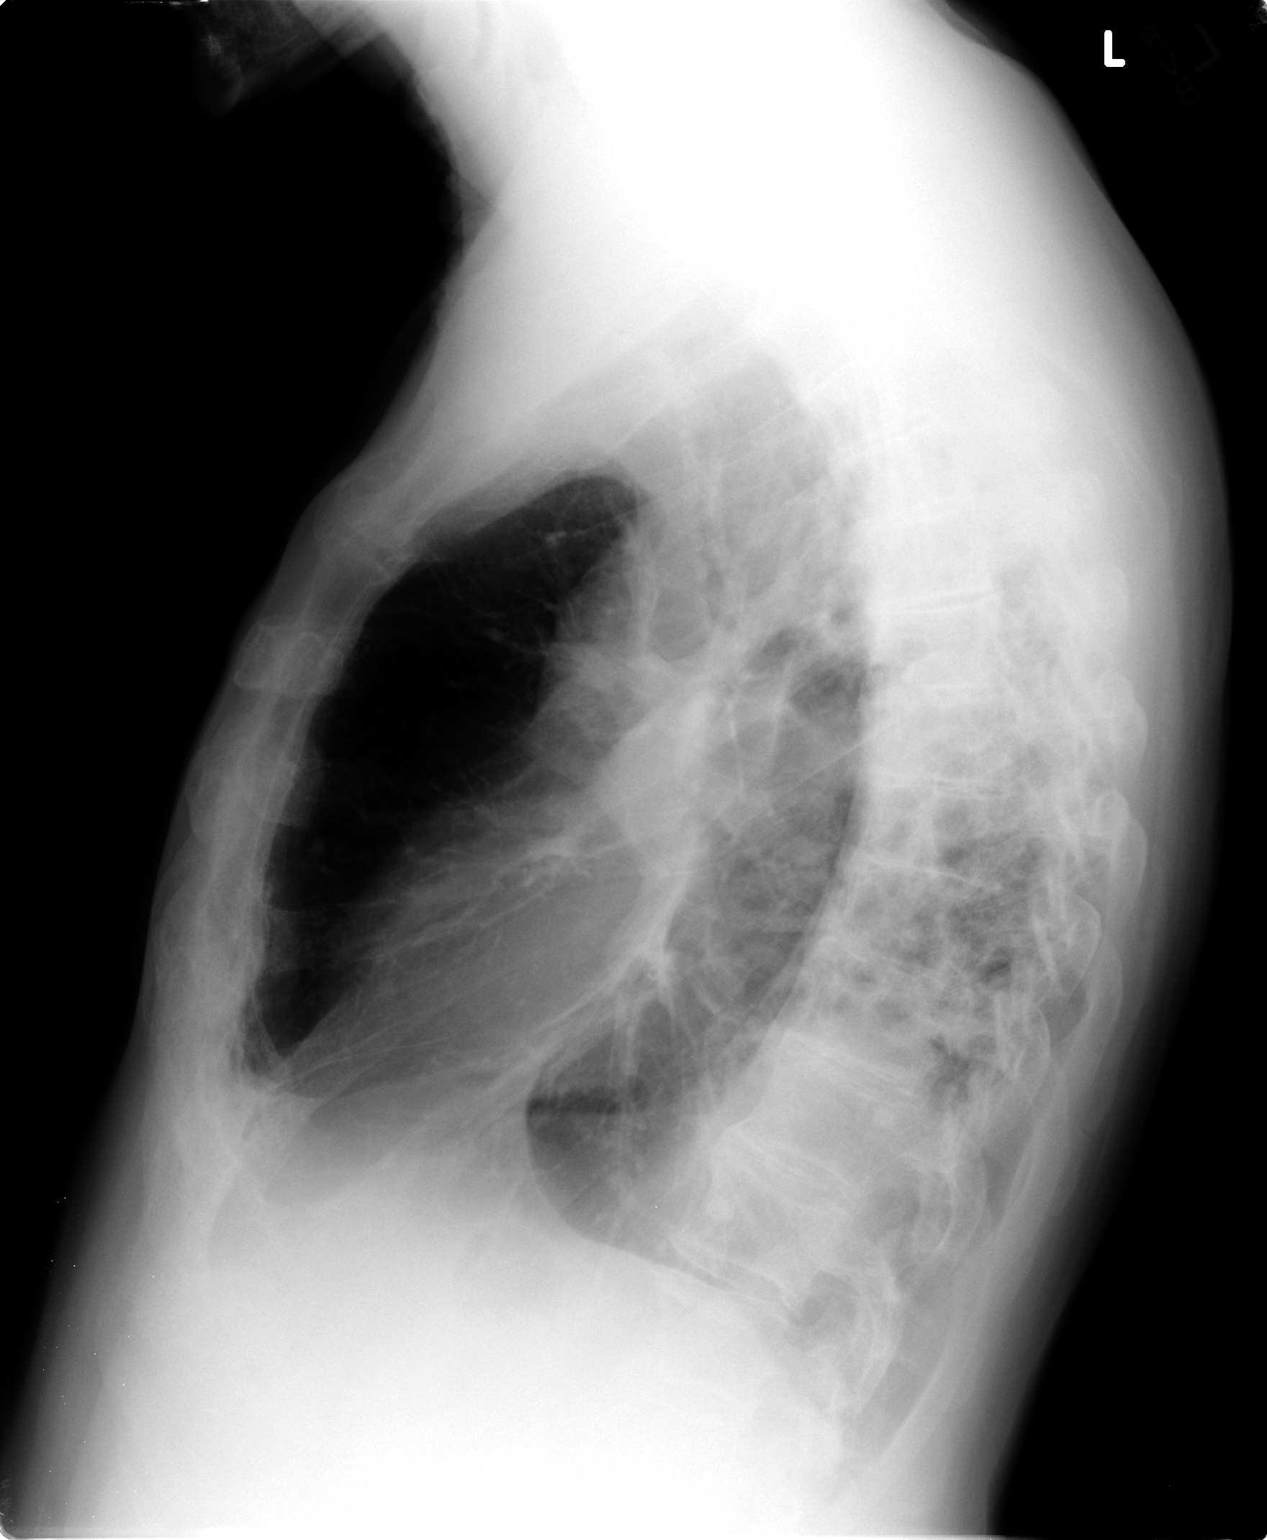

[2 of 2 positions shown; findings below may reference images not displayed]

FINDINGS: Changes of cystic bronchiectasis with associated left
lung atelectasis is again identified as demonstrated on prior CT of
05/05/2011.  There has been an increase in the aeration of the
dilated bronchi especially at the left base.

There is unchanged mediastinal shift into the left hemithorax and
heart size is poorly assessed.  Hyperexpansion of the right lung
field is seen with subsequent attenuation of parenchymal markings.
The right lung parenchyma appears clear with no focal infiltrate or
signs of congestive failure.  No pleural fluid is seen on the
right. No pneumothorax is identified on either side.

Splenic granulomata are again seen.  Bony structures demonstrate
degenerative osteophytosis of the lower thoracic spine and are
otherwise intact.
IMPRESSION: Some interval increase in aeration of the dilated left lower lobe
bronchi. Clear right lung. No signs of residual pneumothorax noted.

## 2013-05-13 ENCOUNTER — Encounter (HOSPITAL_COMMUNITY): Payer: Self-pay

## 2013-05-13 ENCOUNTER — Inpatient Hospital Stay (HOSPITAL_COMMUNITY)
Admission: EM | Admit: 2013-05-13 | Discharge: 2013-05-15 | DRG: 192 | Disposition: A | Discharge status: Home / Self Care | Payer: Medicare Other | Attending: Internal Medicine | Admitting: Internal Medicine

## 2013-05-13 ENCOUNTER — Emergency Department (HOSPITAL_COMMUNITY): Payer: Medicare Other

## 2013-05-13 DIAGNOSIS — Z87891 Personal history of nicotine dependence: Secondary | ICD-10-CM

## 2013-05-13 DIAGNOSIS — J441 Chronic obstructive pulmonary disease with (acute) exacerbation: Secondary | ICD-10-CM

## 2013-05-13 DIAGNOSIS — F0390 Unspecified dementia without behavioral disturbance: Secondary | ICD-10-CM

## 2013-05-13 DIAGNOSIS — I10 Essential (primary) hypertension: Secondary | ICD-10-CM | POA: Diagnosis present

## 2013-05-13 DIAGNOSIS — Z9981 Dependence on supplemental oxygen: Secondary | ICD-10-CM

## 2013-05-13 DIAGNOSIS — F411 Generalized anxiety disorder: Secondary | ICD-10-CM | POA: Diagnosis present

## 2013-05-13 DIAGNOSIS — Z8701 Personal history of pneumonia (recurrent): Secondary | ICD-10-CM

## 2013-05-13 DIAGNOSIS — Z79899 Other long term (current) drug therapy: Secondary | ICD-10-CM

## 2013-05-13 DIAGNOSIS — E876 Hypokalemia: Secondary | ICD-10-CM | POA: Diagnosis present

## 2013-05-13 DIAGNOSIS — I252 Old myocardial infarction: Secondary | ICD-10-CM

## 2013-05-13 HISTORY — DX: Unspecified asthma, uncomplicated: J45.909

## 2013-05-13 HISTORY — DX: Anxiety disorder, unspecified: F41.9

## 2013-05-13 HISTORY — DX: Dependence on supplemental oxygen: Z99.81

## 2013-05-13 HISTORY — DX: Shortness of breath: R06.02

## 2013-05-13 HISTORY — DX: Unspecified dementia, unspecified severity, without behavioral disturbance, psychotic disturbance, mood disturbance, and anxiety: F03.90

## 2013-05-13 LAB — GLUCOSE, CAPILLARY: Glucose-Capillary: 144 mg/dL — ABNORMAL HIGH (ref 70–99)

## 2013-05-13 LAB — POCT I-STAT 3, ART BLOOD GAS (G3+)
Bicarbonate: 33.7 mEq/L — ABNORMAL HIGH (ref 20.0–24.0)
Patient temperature: 98.2
pCO2 arterial: 54.7 mmHg — ABNORMAL HIGH (ref 35.0–45.0)
pH, Arterial: 7.397 (ref 7.350–7.450)

## 2013-05-13 LAB — BASIC METABOLIC PANEL
CO2: 30 mEq/L (ref 19–32)
Chloride: 96 mEq/L (ref 96–112)
GFR calc non Af Amer: 88 mL/min — ABNORMAL LOW (ref >90)
Glucose, Bld: 140 mg/dL — ABNORMAL HIGH (ref 70–99)
Potassium: 3.3 mEq/L — ABNORMAL LOW (ref 3.5–5.1)
Sodium: 137 mEq/L (ref 135–145)

## 2013-05-13 LAB — CBC WITH DIFFERENTIAL/PLATELET
Eosinophils Absolute: 0.3 10*3/uL (ref 0.0–0.7)
Hemoglobin: 13.2 g/dL (ref 13.0–17.0)
Lymphocytes Relative: 5 % — ABNORMAL LOW (ref 12–46)
Lymphs Abs: 0.5 10*3/uL — ABNORMAL LOW (ref 0.7–4.0)
Monocytes Relative: 9 % (ref 3–12)
Neutro Abs: 9 10*3/uL — ABNORMAL HIGH (ref 1.7–7.7)
Neutrophils Relative %: 83 % — ABNORMAL HIGH (ref 43–77)
Platelets: 170 10*3/uL (ref 150–400)
RBC: 4.69 MIL/uL (ref 4.22–5.81)
WBC: 10.9 10*3/uL — ABNORMAL HIGH (ref 4.0–10.5)

## 2013-05-13 LAB — PRO B NATRIURETIC PEPTIDE: Pro B Natriuretic peptide (BNP): 253.4 pg/mL (ref 0–450)

## 2013-05-13 LAB — TROPONIN I: Troponin I: <0.3 ng/mL (ref <0.30)

## 2013-05-13 LAB — POCT I-STAT TROPONIN I

## 2013-05-13 LAB — D-DIMER, QUANTITATIVE: D-Dimer, Quant: 0.47 ug/mL-FEU (ref 0.00–0.48)

## 2013-05-13 LAB — TSH: TSH: 0.644 u[IU]/mL (ref 0.350–4.500)

## 2013-05-13 MED ORDER — METHYLPREDNISOLONE SODIUM SUCC 40 MG IJ SOLR
40.0000 mg | Freq: Two times a day (BID) | INTRAMUSCULAR | Status: DC
  Filled 2013-05-13 (×2): qty 1

## 2013-05-13 MED ORDER — ENOXAPARIN SODIUM 40 MG/0.4ML ~~LOC~~ SOLN
40.0000 mg | SUBCUTANEOUS | Status: DC
  Filled 2013-05-13: qty 0.4

## 2013-05-13 MED ORDER — ACETAMINOPHEN 325 MG PO TABS: 650.0000 mg | ORAL_TABLET | Freq: Four times a day (QID) | ORAL | Status: DC | PRN

## 2013-05-13 MED ORDER — ALPRAZOLAM 0.25 MG PO TABS
0.2500 mg | ORAL_TABLET | Freq: Every evening | ORAL | Status: DC | PRN
  Administered 2013-05-13 – 2013-05-14 (×2): 0.5 mg via ORAL
  Filled 2013-05-13 (×2): qty 2

## 2013-05-13 MED ORDER — ALPRAZOLAM 0.25 MG PO TABS
0.5000 mg | ORAL_TABLET | Freq: Once | ORAL | Status: AC
  Administered 2013-05-13: 0.5 mg via ORAL
  Filled 2013-05-13: qty 2

## 2013-05-13 MED ORDER — ONDANSETRON HCL 4 MG/2ML IJ SOLN: 4.0000 mg | Freq: Four times a day (QID) | INTRAMUSCULAR | Status: DC | PRN

## 2013-05-13 MED ORDER — METHYLPREDNISOLONE SODIUM SUCC 40 MG IJ SOLR
40.0000 mg | Freq: Two times a day (BID) | INTRAMUSCULAR | Status: DC
  Administered 2013-05-13 – 2013-05-14 (×2): 40 mg via INTRAVENOUS
  Filled 2013-05-13 (×3): qty 1

## 2013-05-13 MED ORDER — LEVALBUTEROL HCL 0.63 MG/3ML IN NEBU
0.6300 mg | INHALATION_SOLUTION | Freq: Four times a day (QID) | RESPIRATORY_TRACT | Status: DC | PRN
  Filled 2013-05-13: qty 3

## 2013-05-13 MED ORDER — DOXYCYCLINE HYCLATE 100 MG PO TABS
100.0000 mg | ORAL_TABLET | Freq: Two times a day (BID) | ORAL | Status: DC
  Administered 2013-05-13 – 2013-05-15 (×4): 100 mg via ORAL
  Filled 2013-05-13 (×5): qty 1

## 2013-05-13 MED ORDER — METHYLPREDNISOLONE SODIUM SUCC 125 MG IJ SOLR
125.0000 mg | Freq: Once | INTRAMUSCULAR | Status: AC
  Administered 2013-05-13: 125 mg via INTRAVENOUS
  Filled 2013-05-13: qty 2

## 2013-05-13 MED ORDER — QUETIAPINE FUMARATE 100 MG PO TABS
100.0000 mg | ORAL_TABLET | Freq: Every day | ORAL | Status: DC
  Administered 2013-05-13 – 2013-05-14 (×2): 100 mg via ORAL
  Filled 2013-05-13 (×3): qty 1

## 2013-05-13 MED ORDER — ENOXAPARIN SODIUM 40 MG/0.4ML ~~LOC~~ SOLN
40.0000 mg | Freq: Every day | SUBCUTANEOUS | Status: DC
  Administered 2013-05-13 – 2013-05-15 (×3): 40 mg via SUBCUTANEOUS
  Filled 2013-05-13 (×3): qty 0.4

## 2013-05-13 MED ORDER — ONDANSETRON HCL 4 MG PO TABS: 4.0000 mg | ORAL_TABLET | Freq: Four times a day (QID) | ORAL | Status: DC | PRN

## 2013-05-13 MED ORDER — SODIUM CHLORIDE 0.9 % IJ SOLN: 3.0000 mL | Freq: Two times a day (BID) | INTRAMUSCULAR | Status: DC

## 2013-05-13 MED ORDER — SODIUM CHLORIDE 0.9 % IJ SOLN
3.0000 mL | Freq: Two times a day (BID) | INTRAMUSCULAR | Status: DC
  Administered 2013-05-13 – 2013-05-15 (×5): 3 mL via INTRAVENOUS

## 2013-05-13 MED ORDER — DONEPEZIL HCL 5 MG PO TABS
5.0000 mg | ORAL_TABLET | Freq: Every day | ORAL | Status: DC
  Administered 2013-05-13 – 2013-05-14 (×2): 5 mg via ORAL
  Filled 2013-05-13 (×3): qty 1

## 2013-05-13 MED ORDER — IPRATROPIUM BROMIDE 0.02 % IN SOLN
0.5000 mg | Freq: Once | RESPIRATORY_TRACT | Status: AC
  Administered 2013-05-13: 0.5 mg via RESPIRATORY_TRACT
  Filled 2013-05-13: qty 2.5

## 2013-05-13 MED ORDER — DEXTROSE 5 % IV SOLN
1.0000 g | INTRAVENOUS | Status: DC
  Administered 2013-05-13 – 2013-05-14 (×2): 1 g via INTRAVENOUS
  Filled 2013-05-13 (×3): qty 10

## 2013-05-13 MED ORDER — LEVOFLOXACIN IN D5W 750 MG/150ML IV SOLN
750.0000 mg | INTRAVENOUS | Status: DC
  Filled 2013-05-13: qty 150

## 2013-05-13 MED ORDER — LEVALBUTEROL HCL 0.63 MG/3ML IN NEBU: 0.6300 mg | INHALATION_SOLUTION | Freq: Three times a day (TID) | RESPIRATORY_TRACT | Status: DC

## 2013-05-13 MED ORDER — MOMETASONE FURO-FORMOTEROL FUM 100-5 MCG/ACT IN AERO
2.0000 | INHALATION_SPRAY | Freq: Two times a day (BID) | RESPIRATORY_TRACT | Status: DC
  Administered 2013-05-13 – 2013-05-15 (×4): 2 via RESPIRATORY_TRACT
  Filled 2013-05-13: qty 8.8

## 2013-05-13 MED ORDER — ASPIRIN 81 MG PO CHEW
81.0000 mg | CHEWABLE_TABLET | Freq: Every day | ORAL | Status: DC
  Administered 2013-05-13 – 2013-05-15 (×3): 81 mg via ORAL
  Filled 2013-05-13 (×3): qty 1

## 2013-05-13 MED ORDER — LEVALBUTEROL HCL 0.63 MG/3ML IN NEBU
0.6300 mg | INHALATION_SOLUTION | Freq: Three times a day (TID) | RESPIRATORY_TRACT | Status: DC
  Administered 2013-05-14 – 2013-05-15 (×4): 0.63 mg via RESPIRATORY_TRACT
  Filled 2013-05-13 (×8): qty 3

## 2013-05-13 MED ORDER — ACETAMINOPHEN 650 MG RE SUPP: 650.0000 mg | Freq: Four times a day (QID) | RECTAL | Status: DC | PRN

## 2013-05-13 MED ORDER — SODIUM CHLORIDE 0.9 % IJ SOLN: 3.0000 mL | INTRAMUSCULAR | Status: DC | PRN

## 2013-05-13 MED ORDER — LEVALBUTEROL HCL 0.63 MG/3ML IN NEBU
0.6300 mg | INHALATION_SOLUTION | Freq: Four times a day (QID) | RESPIRATORY_TRACT | Status: DC
  Administered 2013-05-13 (×2): 0.63 mg via RESPIRATORY_TRACT
  Filled 2013-05-13 (×2): qty 3

## 2013-05-13 MED ORDER — SODIUM CHLORIDE 0.9 % IV SOLN: 250.0000 mL | INTRAVENOUS | Status: DC | PRN

## 2013-05-13 NOTE — H&P (Addendum)
[facility] History and Physical  [name] RUE:[id number] DOB: [birth year] DOA: 05/13/2013  Referring physician:  PCP: Thora Lance, MD   Chief Complaint:   HPI:  79 year old male with history of dementia presents to the ER with shortness of breath. The patient is unable to provide me with any history. From the notes it is gathered that patient had a pneumonia last year lung collapse and had a pneumonectomy for this. He is on home oxygen 2 L continuous basis. He has been having some dyspnea on exertion for the last couple of days. He also is a chronic intermittent cough with a history of COPD. Discussed with his wife who states that the patient has been extremely anxious in the last couple of days      Review of Systems: negative for the following  Constitutional: Denies fever, chills, diaphoresis, appetite change and fatigue.  HEENT: Denies photophobia, eye pain, redness, hearing loss, ear pain, congestion, sore throat, rhinorrhea, sneezing, mouth sores, trouble swallowing, neck pain, neck stiffness and tinnitus.  Respiratory: Denies SOB, DOE, cough, chest tightness, and wheezing.  Cardiovascular: Denies chest pain, palpitations and leg swelling.  Gastrointestinal: Denies nausea, vomiting, abdominal pain, diarrhea, constipation, blood in stool and abdominal distention.  Genitourinary: Denies dysuria, urgency, frequency, hematuria, flank pain and difficulty urinating.  Musculoskeletal: Denies myalgias, back pain, joint swelling, arthralgias and gait problem.  Skin: Denies pallor, rash and wound.  Neurological: Denies dizziness, seizures, syncope, weakness, light-headedness, numbness and headaches.  Hematological: Denies adenopathy. Easy bruising, personal or family bleeding history  Psychiatric/Behavioral: Denies suicidal ideation, mood changes, confusion, nervousness, sleep disturbance and agitation       Past Medical History  Diagnosis Date  . Syncopal episodes   .  Collapse of left lung   . Pneumonia   . Emphysema   . Delirium   . Hypertension   . Non-ST elevation MI (NSTEMI)     type 2  . Bronchiectasis   . COPD (chronic obstructive pulmonary disease)      Past Surgical History  Procedure Laterality Date  . Cardiac catheterization        Social History:  reports that he quit smoking about 34 years ago. His smoking use included Cigarettes and Cigars. He has a 30 pack-year smoking history. His smokeless tobacco use includes Snuff and Chew. He reports that he does not drink alcohol or use illicit drugs.    No Known Allergies  History reviewed. No pertinent family history.   Prior to Admission medications   Medication Sig Start Date End Date Taking? Authorizing Provider  ALPRAZolam Prudy Feeler) 0.5 MG tablet Take 0.25-0.5 mg by mouth at bedtime as needed for sleep.   Yes Historical Provider, MD  aspirin 81 MG tablet Take 81 mg by mouth daily.     Yes Historical Provider, MD  donepezil (ARICEPT) 5 MG tablet Take 5 mg by mouth at bedtime as needed.   Yes Historical Provider, MD  Fluticasone-Salmeterol (ADVAIR) 250-50 MCG/DOSE AEPB Inhale 1 puff into the lungs 2 (two) times daily. 10/21/11  Yes Daniel J Angiulli, PA-C  hydrochlorothiazide 25 MG tablet Take 25 mg by mouth daily.    Yes Historical Provider, MD  ipratropium-albuterol (DUONEB) 0.5-2.5 (3) MG/3ML SOLN Take 3 mLs by nebulization 2 (two) times daily.     Yes Historical Provider, MD  QUEtiapine (SEROQUEL) 100 MG tablet Take 100 mg by mouth at bedtime.   Yes Historical Provider, MD  Multiple Vitamin (MULTIVITAMIN) tablet Take 1 tablet by mouth daily.  Historical Provider, MD  QUEtiapine (SEROQUEL) 100 MG tablet Take 1 tablet (100 mg total) by mouth at bedtime. 10/18/11 11/17/11  Charlton Amor, PA-C     Physical Exam: Filed Vitals:   05/13/13 1115 05/13/13 1220 05/13/13 1327 05/13/13 1458  BP: 152/55 143/62 158/72 165/64  Pulse: 88 91 72 75  Temp:    98.5 F (36.9 C)  TempSrc:     Oral  Resp: 27 27 21 18   Height:      Weight:      SpO2: 98% 97% 98% 96%     Constitutional: Vital signs reviewed. Patient is a well-developed and well-nourished in no acute distress and cooperative with exam. Alert and oriented x3.  Head: Normocephalic and atraumatic  Ear: TM normal bilaterally  Mouth: no erythema or exudates, MMM  Eyes: PERRL, EOMI, conjunctivae normal, No scleral icterus.  Neck: Supple, Trachea midline normal ROM, No JVD, mass, thyromegaly, or carotid bruit present.  Cardiovascular: RRR, S1 normal, S2 normal, no MRG, pulses symmetric and intact bilaterally  Pulmonary/Chest: CTAB, no wheezes, rales, or rhonchi  Abdominal: Soft. Non-tender, non-distended, bowel sounds are normal, no masses, organomegaly, or guarding present.  GU: no CVA tenderness Musculoskeletal: No joint deformities, erythema, or stiffness, ROM full and no nontender Ext: no edema and no cyanosis, pulses palpable bilaterally (DP and PT)  Hematology: no cervical, inginal, or axillary adenopathy.  Neurological: A&O x3, Strenght is normal and symmetric bilaterally, cranial nerve II-XII are grossly intact, no focal motor deficit, sensory intact to light touch bilaterally.  Skin: Warm, dry and intact. No rash, cyanosis, or clubbing.  Psychiatric: Normal mood and affect. speech and behavior is normal. Judgment and thought content normal. Cognition and memory are normal.       Labs on Admission:    Basic Metabolic Panel:  Recent Labs Lab 05/13/13 1100  NA 137  K 3.3*  CL 96  CO2 30  GLUCOSE 140*  BUN 12  CREATININE 0.69  CALCIUM 9.3   Liver Function Tests: No results found for this basename: AST, ALT, ALKPHOS, BILITOT, PROT, ALBUMIN,  in the last 168 hours No results found for this basename: LIPASE, AMYLASE,  in the last 168 hours No results found for this basename: AMMONIA,  in the last 168 hours CBC:  Recent Labs Lab 05/13/13 1100  WBC 10.9*  NEUTROABS 9.0*  HGB 13.2  HCT 39.2   MCV 83.6  PLT 170   Cardiac Enzymes: No results found for this basename: CKTOTAL, CKMB, CKMBINDEX, TROPONINI,  in the last 168 hours  BNP (last 3 results) No results found for this basename: PROBNP,  in the last 8760 hours    CBG:  Recent Labs Lab 05/13/13 1046  GLUCAP 144*    Radiological Exams on Admission: Dg Chest Portable 1 View  05/13/2013   *RADIOLOGY REPORT*  Clinical Data: Cough, shortness of breath  PORTABLE CHEST - 1 VIEW  Comparison: 04/13/2012  Findings: Cardiomediastinal silhouette is stable.  Again noted volume loss and chronic lung changes left hemithorax.  Mild hyperinflation right hemithorax is stable.  Bony thorax is unremarkable.  IMPRESSION:  Again noted volume loss and chronic lung changes left hemithorax. Mild hyperinflation right hemithorax is stable.  Bony thorax is unremarkable.   Original Report Authenticated By: Natasha Mead, M.D.    EKG: Independently reviewed.  Assessment/Plan Active Problems:   Dementia   COPD exacerbation   COPD exacerbation Chest x-ray shows chronic changes in the left hemithorax and mild hyperinflation of the right hemithorax .  The patient on IV Solu-Medrol, nebulizer treatments Empiric antibiotics ABG shows mild hypercapnia but no bypass indicated at this time We'll obtain a 2-D echo, BNP,  Anxiety Will continue with when necessary Ativan if  Dementia continue with Aricept and cerebral   Hypokalemia replete     COPD exacerbation Code Status:   full Family Communication: bedside Disposition Plan: admit   Time spent: 70 mins   [facility] Pager [phone]  If 7PM-7AM, please contact night-coverage [email or web address] Password [email or web address] 05/13/2013, 3:03 PM

## 2013-05-13 NOTE — Progress Notes (Signed)
05/13/2013 patient transfer from the emergency room to 6700, he is alert, oriented to person time and place he is not aware. Patient ambulatory with assist but get  Shortness of breath on exertion. Patient skin is fine but he does have tattoo on left upper arm, feet is dry. He was placed on a bed alarm, because per wife patient is very impulsive and will get out of bed. He was place on telemetry when arrived on the unit by [place] (Press photographer). [facility] RN.

## 2013-05-13 NOTE — ED Notes (Signed)
Pt here by [facility], for sob, pt only has left lung. Right has been removed, is supposed to wear oxygen and does not wear it as prescribed. Pt became diaphoretic and clammy on arrival to ed.

## 2013-05-13 NOTE — ED Notes (Signed)
Pt wife reporting pt anxious, agitated, restless. PA made aware.

## 2013-05-13 NOTE — ED Provider Notes (Signed)
History    CSN: [id number] Arrival date & time 05/13/13  1038  First MD Initiated Contact with Patient 05/13/13 1054     Chief Complaint  Patient presents with  . Shortness of Breath   (Consider location/radiation/quality/duration/timing/severity/associated sxs/prior Treatment) HPI Pt is a 79yo male BIB EMS for SOB increasing over the past few days.  Pt states his wife insisted he come to the ER.  Wife states pt had a lung removed (cannot recall which lung), believes it was due to pneumonia last year, lung collapsed and was unable to re-inflate so it was removed. Pt is suppose to be on 2L of oxygen at home 24hours a day every day, however pt only wears oxygen at night.  Wife is concerned for possible pneumonia due to increased dyspnea.  Pt has chronic intermittent productive cough with clear to white colored phlegm.  Denies fever, chest pain, n/v/d.  Pt states he does not want to be here. PCP is Dr. Manus Gunning, Pulmonologist: Dr. Shan Levans.   Past Medical History  Diagnosis Date  . Syncopal episodes   . Collapse of left lung   . Pneumonia   . Emphysema   . Delirium   . Hypertension   . Non-ST elevation MI (NSTEMI)     type 2  . Bronchiectasis   . COPD (chronic obstructive pulmonary disease)    Past Surgical History  Procedure Laterality Date  . Cardiac catheterization     History reviewed. No pertinent family history. History  Substance Use Topics  . Smoking status: Former Smoker -- 1.50 packs/day for 20 years    Types: Cigarettes, Cigars    Quit date: 11/07/1978  . Smokeless tobacco: Current User    Types: Snuff, Chew     Comment: occas smokeless tobacco use  . Alcohol Use: No    Review of Systems  Constitutional: Negative for fever, chills, diaphoresis and fatigue.  Respiratory: Positive for cough and shortness of breath.   Cardiovascular: Negative for chest pain, palpitations and leg swelling.  Gastrointestinal: Negative for nausea, vomiting and abdominal pain.   All other systems reviewed and are negative.    Allergies  Review of patient's allergies indicates no known allergies.  Home Medications   Current Outpatient Rx  Name  Route  Sig  Dispense  Refill  . ALPRAZolam (XANAX) 0.5 MG tablet   Oral   Take 0.25-0.5 mg by mouth at bedtime as needed for sleep.         Marland Kitchen aspirin 81 MG tablet   Oral   Take 81 mg by mouth daily.           Marland Kitchen donepezil (ARICEPT) 5 MG tablet   Oral   Take 5 mg by mouth at bedtime as needed.         . Fluticasone-Salmeterol (ADVAIR) 250-50 MCG/DOSE AEPB   Inhalation   Inhale 1 puff into the lungs 2 (two) times daily.   60 each   1   . hydrochlorothiazide 25 MG tablet   Oral   Take 25 mg by mouth daily.          Marland Kitchen ipratropium-albuterol (DUONEB) 0.5-2.5 (3) MG/3ML SOLN   Nebulization   Take 3 mLs by nebulization 2 (two) times daily.           . QUEtiapine (SEROQUEL) 100 MG tablet   Oral   Take 100 mg by mouth at bedtime.         . Multiple Vitamin (MULTIVITAMIN) tablet   Oral  Take 1 tablet by mouth daily.           Marland Kitchen EXPIRED: QUEtiapine (SEROQUEL) 100 MG tablet   Oral   Take 1 tablet (100 mg total) by mouth at bedtime.   30 tablet   1    BP 158/72  Pulse 72  Temp(Src) 98.2 F (36.8 C) (Oral)  Resp 21  Ht 5\' 11"  (1.803 m)  Wt 175 lb (79.379 kg)  BMI 24.42 kg/m2  SpO2 98% Physical Exam  Nursing note and vitals reviewed. Constitutional: He appears well-developed and well-nourished. No distress.  Pt sitting upright in exam bed, nasal canula in place. NAD.   HENT:  Head: Normocephalic and atraumatic.  Mouth/Throat: Oropharynx is clear and moist. No oropharyngeal exudate.  Eyes: Conjunctivae are normal. No scleral icterus.  Neck: Normal range of motion. Neck supple.  Cardiovascular: Normal rate, regular rhythm and normal heart sounds.   Pulmonary/Chest: Effort normal. No respiratory distress. He has wheezes (diffuse, mild). He has rales. He exhibits no tenderness.   Abdominal: Soft. Bowel sounds are normal. He exhibits no distension and no mass. There is no tenderness. There is no rebound and no guarding.  Musculoskeletal: Normal range of motion.  Neurological: He is alert.  Skin: Skin is warm. He is diaphoretic.    ED Course  Procedures (including critical care time) Labs Reviewed  GLUCOSE, CAPILLARY - Abnormal; Notable for the following:    Glucose-Capillary 144 (*)    All other components within normal limits  CBC WITH DIFFERENTIAL - Abnormal; Notable for the following:    WBC 10.9 (*)    Neutrophils Relative % 83 (*)    Neutro Abs 9.0 (*)    Lymphocytes Relative 5 (*)    Lymphs Abs 0.5 (*)    All other components within normal limits  BASIC METABOLIC PANEL - Abnormal; Notable for the following:    Potassium 3.3 (*)    Glucose, Bld 140 (*)    GFR calc non Af Amer 88 (*)    All other components within normal limits  BLOOD GAS, ARTERIAL  POCT I-STAT TROPONIN I   Dg Chest Portable 1 View  05/13/2013   *RADIOLOGY REPORT*  Clinical Data: Cough, shortness of breath  PORTABLE CHEST - 1 VIEW  Comparison: 04/13/2012  Findings: Cardiomediastinal silhouette is stable.  Again noted volume loss and chronic lung changes left hemithorax.  Mild hyperinflation right hemithorax is stable.  Bony thorax is unremarkable.  IMPRESSION:  Again noted volume loss and chronic lung changes left hemithorax. Mild hyperinflation right hemithorax is stable.  Bony thorax is unremarkable.   Original Report Authenticated By: Natasha Mead, M.D.    Date: 05/13/2013  Rate: 95  Rhythm: normal sinus rhythm  QRS Axis: normal  Intervals: normal  ST/T Wave abnormalities: indeterminate  Conduction Disutrbances:nonspecific intraventricular conduction delay  Narrative Interpretation:   Old EKG Reviewed: unchanged    1. COPD exacerbation     MDM  Will give pt duoneb tx and start solumedrol, then reevaluate.  Pt states he feels slightly better and would prefer to go home.  Discussed pt with Dr. Radford Pax who believes pt should be admitted for observation for COPD exacerbation due to SOB, diaphoresis, and extensive pulmonary hx, pt had 1 lung.  Vitals: pulse-70-90, Resp-20-27, BP-wnl, O2 on 2L [place]-98%  Troponin: 0.0 CBC: wbc-10.9 BMP: mild hypokalemia-3.3 Glucose: 144 CXR: volume loss and chronic lung changes left hemithorax. Mild hyperinflation right hemithorax is stable. Bony thorax is unremarkable.   Will consult [facility] to  admit pt for COPD exacerbation.  Spoke with Dr. Susie Cassette, [facility], who agreed to admit pt to Obs tele Team 2.   Will get ABG.           Junius Finner, PA-C 05/13/13 1419

## 2013-05-14 ENCOUNTER — Inpatient Hospital Stay (HOSPITAL_COMMUNITY): Payer: Medicare Other

## 2013-05-14 DIAGNOSIS — I519 Heart disease, unspecified: Secondary | ICD-10-CM

## 2013-05-14 DIAGNOSIS — J441 Chronic obstructive pulmonary disease with (acute) exacerbation: Principal | ICD-10-CM

## 2013-05-14 LAB — COMPREHENSIVE METABOLIC PANEL
ALT: 11 U/L (ref 0–53)
AST: 32 U/L (ref 0–37)
Albumin: 3.4 g/dL — ABNORMAL LOW (ref 3.5–5.2)
Alkaline Phosphatase: 81 U/L (ref 39–117)
Calcium: 9.2 mg/dL (ref 8.4–10.5)
Potassium: 3.7 mEq/L (ref 3.5–5.1)
Sodium: 138 mEq/L (ref 135–145)
Total Protein: 7.4 g/dL (ref 6.0–8.3)

## 2013-05-14 LAB — CBC
HCT: 38.2 % — ABNORMAL LOW (ref 39.0–52.0)
Hemoglobin: 12.4 g/dL — ABNORMAL LOW (ref 13.0–17.0)
MCH: 27.3 pg (ref 26.0–34.0)
MCHC: 32.5 g/dL (ref 30.0–36.0)
MCV: 84 fL (ref 78.0–100.0)

## 2013-05-14 MED ORDER — ALPRAZOLAM 0.25 MG PO TABS
0.2500 mg | ORAL_TABLET | Freq: Four times a day (QID) | ORAL | Status: DC | PRN
  Administered 2013-05-14 – 2013-05-15 (×3): 0.25 mg via ORAL
  Filled 2013-05-14 (×3): qty 1

## 2013-05-14 MED ORDER — PREDNISONE 20 MG PO TABS
40.0000 mg | ORAL_TABLET | Freq: Every day | ORAL | Status: DC
  Administered 2013-05-15: 40 mg via ORAL
  Filled 2013-05-14 (×2): qty 2

## 2013-05-14 NOTE — Progress Notes (Signed)
[facility] PROGRESS NOTE  Tavaris Eudy ZOX:[id number] DOB: 1932/09/05 DOA: 05/13/2013 PCP: Thora Lance, MD  Assessment/Plan: Active Problems:   Dementia   COPD exacerbation    COPD exacerbation  Chest x-ray shows chronic changes in the left hemithorax and mild hyperinflation of the right hemithorax  . The patient on IV Solu-Medrol, nebulizer treatments  Empiric antibiotics  ABG shows mild hypercapnia but not high enough to warrant BiPAP 2-D echo pending, BNP d-dimer negative Repeat chest x-ray today    Anxiety Will continue with when necessary Ativan  Dementia continue with Aricept and Seroquel  Hypokalemia replete   Code Status: full Family Communication: family updated about patient's clinical progress Disposition Plan:  As above    Brief narrative: 79 year old male with history of dementia presents to the ER with shortness of breath. The patient is unable to provide me with any history. From the notes it is gathered that patient had a pneumonia last year lung collapse and had a pneumonectomy for this. He is on home oxygen 2 L continuous basis. He has been having some dyspnea on exertion for the last couple of days. He also is a chronic intermittent cough with a history of COPD. Discussed with his wife who states that the patient has been extremely anxious in the last couple of days      Consultants:  None  Procedures:  None  Antibiotics:  None  HPI/Subjective: Denies any shortness of breath this morning  Objective: Filed Vitals:   05/13/13 1600 05/13/13 2028 05/13/13 2057 05/14/13 0500  BP: 150/68  149/63 145/70  Pulse:   88 78  Temp: 98.8 F (37.1 C)  97.3 F (36.3 C) 97.9 F (36.6 C)  TempSrc: Oral  Oral Oral  Resp: 20  20 20   Height:      Weight:   79.379 kg (175 lb)   SpO2: 98% 98% 96% 96%    Intake/Output Summary (Last 24 hours) at 05/14/13 0757 Last data filed at 05/14/13 0501  Gross per 24 hour  Intake     50 ml  Output       0 ml  Net     50 ml    Exam:    Cardiovascular: Normal rate, regular rhythm, normal heart sounds and intact distal pulses.  Pulmonary/Chest: Effort normal and breath sounds normal. No respiratory distress.  Abdominal: Soft. Normal appearance and bowel sounds are normal. She exhibits no distension. There is no tenderness.  Musculoskeletal: She exhibits no edema and no tenderness.  Neurological: She is alert. No cranial nerve deficit.    Data Reviewed: Basic Metabolic Panel:  Recent Labs Lab 05/13/13 1100 05/14/13 0450  NA 137 138  K 3.3* 3.7  CL 96 98  CO2 30 31  GLUCOSE 140* 149*  BUN 12 16  CREATININE 0.69 0.65  CALCIUM 9.3 9.2    Liver Function Tests:  Recent Labs Lab 05/14/13 0450  AST 32  ALT 11  ALKPHOS 81  BILITOT 0.2*  PROT 7.4  ALBUMIN 3.4*   No results found for this basename: LIPASE, AMYLASE,  in the last 168 hours No results found for this basename: AMMONIA,  in the last 168 hours  CBC:  Recent Labs Lab 05/13/13 1100 05/14/13 0450  WBC 10.9* 6.0  NEUTROABS 9.0*  --   HGB 13.2 12.4*  HCT 39.2 38.2*  MCV 83.6 84.0  PLT 170 185    Cardiac Enzymes:  Recent Labs Lab 05/13/13 1705 05/13/13 2120 05/14/13 0450  TROPONINI <0.30 <0.30 <  0.30   BNP (last 3 results)  Recent Labs  05/13/13 1705  PROBNP 253.4     CBG:  Recent Labs Lab 05/13/13 1046 05/13/13 2056  GLUCAP 144* 159*    No results found for this or any previous visit (from the past 240 hour(s)).   Studies: Dg Chest Portable 1 View  05/13/2013   *RADIOLOGY REPORT*  Clinical Data: Cough, shortness of breath  PORTABLE CHEST - 1 VIEW  Comparison: 04/13/2012  Findings: Cardiomediastinal silhouette is stable.  Again noted volume loss and chronic lung changes left hemithorax.  Mild hyperinflation right hemithorax is stable.  Bony thorax is unremarkable.  IMPRESSION:  Again noted volume loss and chronic lung changes left hemithorax. Mild hyperinflation right hemithorax is  stable.  Bony thorax is unremarkable.   Original Report Authenticated By: Natasha Mead, M.D.    Scheduled Meds: . aspirin  81 mg Oral Daily  . cefTRIAXone (ROCEPHIN)  IV  1 g Intravenous Q24H  . donepezil  5 mg Oral QHS  . doxycycline  100 mg Oral Q12H  . enoxaparin (LOVENOX) injection  40 mg Subcutaneous Daily  . levalbuterol  0.63 mg Nebulization TID  . methylPREDNISolone (SOLU-MEDROL) injection  40 mg Intravenous Q12H  . mometasone-formoterol  2 puff Inhalation BID  . QUEtiapine  100 mg Oral QHS  . sodium chloride  3 mL Intravenous Q12H  . sodium chloride  3 mL Intravenous Q12H   Continuous Infusions:   Active Problems:   Dementia   COPD exacerbation    Time spent: 40 minutes   [facility]  [facility] Pager [phone]. If 8PM-8AM, please contact night-coverage at [email or web address], password [facility] 05/14/2013, 7:57 AM  LOS: 1 day

## 2013-05-14 NOTE — ED Provider Notes (Signed)
Medical screening examination/treatment/procedure(s) were conducted as a shared visit with non-physician practitioner(s) and myself.  I personally evaluated the patient during the encounter   .Face to face Exam:  General:  A&Ox3 HEENT:  Atraumatic Resp:  Wheezing on expiration. Abd:  Nondistended Neuro:No focal deficits     Nelia Shi, MD 05/14/13 [phone]

## 2013-05-14 NOTE — Progress Notes (Signed)
PT Cancellation Note  Patient Details Name: Kolson Chovanec MRN: [id number] DOB: 12-09-1931   Cancelled Treatment:    Reason Eval/Treat Not Completed: Other (comment) (Pt eating lunch)   Lauriann Milillo 05/14/2013, 12:42 PM

## 2013-05-14 NOTE — Evaluation (Signed)
Physical Therapy Evaluation Patient Details Name: [name] MRN: [id number] DOB: 01-06-1932 Today's Date: 05/14/2013 Time: 4098-1191 PT Time Calculation (min): 35 min  PT Assessment / Plan / Recommendation History of Present Illness  79 y/o male adm with COPD exacerbation.  Clinical Impression  Presents to PT with generalized weakness, balance deficits and baseline dementia impacting his PLOF and independence as well as making him at risk for falls. Will benefit physical therapy in the acute setting to maximize strength for safe d/c plan. Given wife's limited availability to help at home I am recommending ST-SNF as safest d/c plan.     PT Assessment  Patient needs continued PT services    Follow Up Recommendations  SNF;Supervision/Assistance - 24 hour    Does the patient have the potential to tolerate intense rehabilitation      Barriers to Discharge Decreased caregiver support wife and son work during the day and are not available to provide appropriate assistance    Equipment Recommendations   (tbd)    Recommendations for Other Services     Frequency Min 3X/week    Precautions / Restrictions Precautions Precautions: Fall Restrictions Weight Bearing Restrictions: No   Pertinent Vitals/Pain Denies pain; SpO2 95% or above during ambulation on 2 liters; HR 110      Mobility  Bed Mobility Bed Mobility: Supine to Sit Supine to Sit: 5: Supervision;HOB elevated (30 degrees) Details for Bed Mobility Assistance: supervision for safety and cues for efficiency Transfers Transfers: Sit to Stand;Stand to Sit Sit to Stand: 4: Min guard;From bed;With upper extremity assist Stand to Sit: 4: Min guard;To chair/3-in-1;With upper extremity assist Details for Transfer Assistance: gaurding for stability, tremulous trunk and extremities Ambulation/Gait Ambulation/Gait Assistance: 4: Min guard;4: Min assist Ambulation Distance (Feet): 90 Feet Assistive device: None Ambulation/Gait  Assistance Details: min stability assist as well as guidance for movement, patient easily distracted needing redirection back to task; slow shaky gait Gait Pattern: Step-through pattern;Decreased stride length;Narrow base of support;Shuffle Gait velocity: decreased Stairs: No    Exercises     PT Diagnosis: Generalized weakness;Difficulty walking  PT Problem List: Decreased strength;Decreased activity tolerance;Decreased cognition;Decreased safety awareness;Decreased balance;Cardiopulmonary status limiting activity PT Treatment Interventions: DME instruction;Gait training;Stair training;Functional mobility training;Therapeutic activities;Therapeutic exercise;Neuromuscular re-education;Balance training;Patient/family education     PT Goals(Current goals can be found in the care plan section) Acute Rehab PT Goals Patient Stated Goal: wife needs patient to be modified independent PT Goal Formulation: With patient Time For Goal Achievement: 05/21/13 Potential to Achieve Goals: Good  Visit Information  Last PT Received On: 05/14/13 Assistance Needed: +1 Reason Eval/Treat Not Completed: Other (comment) (Pt eating lunch) History of Present Illness: 79 y/o male adm with COPD exacerbation.       Prior Functioning  Home Living Family/patient expects to be discharged to:: Private residence Living Arrangements: Spouse/significant other Available Help at Discharge: Available PRN/intermittently Type of Home: House Home Access: Stairs to enter Secretary/administrator of Steps: 4 Home Layout: One level Home Equipment: Shower seat Prior Function Level of Independence: Independent Communication Communication: No difficulties    Cognition  Cognition Arousal/Alertness: Awake/alert Behavior During Therapy: WFL for tasks assessed/performed Overall Cognitive Status: History of cognitive impairments - at baseline (baseline dementia); patient wanders and tried to go into other patients room;  easily distracted    Extremity/Trunk Assessment Upper Extremity Assessment Upper Extremity Assessment: Defer to OT evaluation Lower Extremity Assessment Lower Extremity Assessment: Generalized weakness   Balance Balance Balance Assessed: Yes Static Standing Balance Static Standing - Balance Support:  No upper extremity supported Static Standing - Level of Assistance: 5: Stand by assistance  End of Session PT - End of Session Equipment Utilized During Treatment: Gait belt;Oxygen Activity Tolerance: Patient tolerated treatment well Patient left: in chair;with call bell/phone within reach Nurse Communication: Mobility status  GP     [facility] HELEN 05/14/2013, 3:43 PM

## 2013-05-14 NOTE — Progress Notes (Signed)
Echo Lab  2D Echocardiogram completed.  Azariya Freeman L Sadye Kiernan, RDCS 05/14/2013 3:18 PM
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
## 2013-05-14 NOTE — Clinical Documentation Improvement (Signed)
THIS DOCUMENT IS NOT A PERMANENT PART OF THE MEDICAL RECORD  Please update your documentation with the medical record to reflect your response to this query. If you need help knowing how to do this please call [phone].  05/14/13  Dear Dr. Susie Cassette Marton Redwood,  In a better effort to capture your patient's severity of illness, reflect appropriate length of stay and utilization of resources, a review of the patient medical record has revealed the following indicators.    Based on your clinical judgment, please clarify and document in a progress note and/or discharge summary the clinical condition associated with the following supporting information:  In responding to this query please exercise your independent judgment.  The fact that a query is asked, does not imply that any particular answer is desired or expected.  Possible Clinical Conditions?  _______Acute Respiratory Failure _______Acute on Chronic Respiratory Failure _______Chronic Respiratory Failure _______Other Condition________________ _______Cannot Clinically Determine    Supporting Information:   Risk Factors:(As per notes) "Pt is suppose to be on 2L of oxygen at home 24hours a day every day, however pt only wears oxygen at night."  Signs&Symptoms:(As per notes)"ABG shows mild hypercapnia but not high enough to warrant BiPAP"  Diagnostics: Radiology: CXR 05-13-13 IMPRESSION:  No change in pleural and parenchymal opacities throughout the left  hemithorax with poor aeration of the left lung and mediastinal  shift to the left.  Treatment: O2 Mode: ([place]@ 2l/m)   Respiratory Treatment:  ipratropium-albuterol (DUONEB) 0.5-2.5 (3) MG/3ML SOLN [[id number]]   Order Details    Dose: 3 mL Route: Nebulization Frequency: 2 times daily     levalbuterol (XOPENEX) nebulizer solution 0.63 mg   [[id number]]    Ordered Dose: 0.63 mg Route: Nebulization Frequency: Every 6 hours PRN for wheezing, shortness of breath                  You may use possible, probable, or suspect with inpatient documentation. possible, probable, suspected diagnoses MUST be documented at the time of discharge  Reviewed:  NO RESPONSE  Thank You,  Nevin Bloodgood RN, BSN, CCDS Clinical Documentation Specialist: [phone] [phone] Health Information Management 

## 2013-05-14 NOTE — Progress Notes (Signed)
05/14/2013 Patient was agitated, shaking and very uncooperative. Dr Susie Cassette was notified and is aware. Per Md iv steroid was change to po. Continue to monitor patient. [facility] RN.

## 2013-05-15 MED ORDER — DOXYCYCLINE HYCLATE 100 MG PO TABS: 100.0000 mg | ORAL_TABLET | Freq: Two times a day (BID) | ORAL | Status: AC

## 2013-05-15 MED ORDER — PREDNISONE 20 MG PO TABS: 40.0000 mg | ORAL_TABLET | Freq: Every day | ORAL | Status: AC

## 2013-05-15 MED ORDER — ALPRAZOLAM 0.5 MG PO TABS: 0.2500 mg | ORAL_TABLET | Freq: Every evening | ORAL | Status: DC | PRN

## 2013-05-15 NOTE — Progress Notes (Signed)
Speech Language Pathology Dysphagia Treatment Patient Details Name: [name] MRN: [id number] DOB: 1932/08/05 Today's Date: 05/15/2013 Time: 4098-1191 SLP Time Calculation (min): 30 min  Assessment / Plan / Recommendation Clinical Impression  F/u from initial BSE completed on 05/14/13 for diet tolerance of regular consistency and thin liquids.  Patient observed directly with thin water by cup only as patient declined solids as he had finished morning meal prior to treatment.   Moderate verbal cues required for patient to take small sips due to decreased safety awareness and impulsivity.  Excessive burping immediately after swallow indicating possible esophageal dysphagia.  Swallow strategies/reasoning and s/s of aspiration reviewed with patient 's spouse with repeat demonstration of understanding.  Spouse reports no increased coughing observed during meal.  Recommend to continue current diet consistency with aspiration precautions and swallow strategies to increase safety of the swallow as aspiration risk remains secondary to current respiratory status and cognitive deficits.  No further Speech Language Pathology warranted at next level of care at this time.     Diet Recommendation  Continue with Current Diet: Regular;Thin liquid    SLP Plan Continue with current plan of care      Swallowing Goals  SLP Swallowing Goals Swallow Study Goal #2 - Progress: Progressing toward goal  General Temperature Spikes Noted: No Respiratory Status: Supplemental O2 delivered via (comment) (nasal cannula) Behavior/Cognition: Alert;Impulsive;Requires cueing Oral Cavity - Dentition: Poor condition;Missing dentition Patient Positioning: Other (comment) (EOB)  Oral Cavity - Oral Hygiene Does patient have any of the following "at risk" factors?: Oxygen therapy - cannula, mask, simple oxygen devices Patient is HIGH RISK - Oral Care Protocol followed (see row info): Yes   Dysphagia Treatment Treatment focused  on: Skilled observation of diet tolerance;Patient/family/caregiver education;Facilitation of pharyngeal phase Family/Caregiver Educated: Spouse Treatment Methods/Modalities: Skilled observation;Differential diagnosis Patient observed directly with PO's: Yes Type of PO's observed: Thin liquids Liquids provided via: Cup Pharyngeal Phase Signs & Symptoms: Other (comment) (burping immediately after the swallow) Type of cueing: Verbal Amount of cueing: Moderate   GO    Moreen Fowler MS, CCC-SLP [phone] [facility] 05/15/2013, 11:40 AM

## 2013-05-15 NOTE — Discharge Summary (Signed)
Pt d/c'd to home with wife per wheelchair. Belongings with pt. IV d/c'd and tele box d/c'd. Pt and wife voice understanding of all d/c instructions. Prescriptions to pt. Pt escorted to car by A. Mintz RN to hook up pt's own portable oxygen.

## 2013-05-15 NOTE — Discharge Summary (Signed)
Physician Discharge Summary  [name] MRN: [id number] DOB/AGE: March 01, 1932 79 y.o.  PCP: Thora Lance, MD   Admit date: 05/13/2013 Discharge date: 05/15/2013  Discharge Diagnoses:  Anxiety disorder   Dementia   COPD exacerbation     Medication List         ALPRAZolam 0.5 MG tablet  Commonly known as:  XANAX  Take 0.5-1 tablets (0.25-0.5 mg total) by mouth at bedtime as needed for sleep.     aspirin 81 MG tablet  Take 81 mg by mouth daily.     donepezil 5 MG tablet  Commonly known as:  ARICEPT  Take 5 mg by mouth at bedtime as needed.     doxycycline 100 MG tablet  Commonly known as:  VIBRA-TABS  Take 1 tablet (100 mg total) by mouth every 12 (twelve) hours.     Fluticasone-Salmeterol 250-50 MCG/DOSE Aepb  Commonly known as:  ADVAIR  Inhale 1 puff into the lungs 2 (two) times daily.     hydrochlorothiazide 25 MG tablet  Commonly known as:  HYDRODIURIL  Take 25 mg by mouth daily.     ipratropium-albuterol 0.5-2.5 (3) MG/3ML Soln  Commonly known as:  DUONEB  Take 3 mLs by nebulization 2 (two) times daily.     multivitamin tablet  Take 1 tablet by mouth daily.     predniSONE 20 MG tablet  Commonly known as:  DELTASONE  Take 2 tablets (40 mg total) by mouth daily with breakfast.     QUEtiapine 100 MG tablet  Commonly known as:  SEROQUEL  Take 100 mg by mouth at bedtime.     QUEtiapine 100 MG tablet  Commonly known as:  SEROQUEL  Take 1 tablet (100 mg total) by mouth at bedtime.        Discharge Condition: Stable  Disposition: 06-Home-Health Care Svc   Consults:  None  Significant Diagnostic Studies: Dg Chest 2 View  05/14/2013   *RADIOLOGY REPORT*  Clinical Data: Shortness of breath, history hypertension, COPD, bronchiectasis, asthma, left lung collapse  CHEST - 2 VIEW  Comparison: 05/13/2013  Findings: Marked volume loss in the left hemithorax with mediastinal shift to the left. Hyperexpanded right lung. Normal heart size. Extensive  scarring throughout right lung greatest in upper lobe areas of pleuroparenchymal opacity and bronchiectasis. Underlying emphysematous changes. Right lung clear. No pleural effusion or pneumothorax. Bones appear demineralized. Calcified granulomata in the spleen. Areas of question slightly increased opacity at mid thoracic vertebrae may be related to superimposed parenchymal tissue but cannot entirely exclude areas of vertebral sclerosis; these are not visualized on lateral view, recommend attention on follow-up studies.  IMPRESSION: Significant diffuse left lung scarring and volume loss. Underlying COPD with hyperinflation of right lung. No definite acute infiltrate. Questionable areas of density at mid thoracic vertebrae on the AP view not seen on lateral view question superimposed artifacts, recommend attention on follow-up exams to exclude sclerotic lesions.   Original Report Authenticated By: Ulyses Southward, M.D.   Dg Chest Portable 1 View  05/13/2013   *RADIOLOGY REPORT*  Clinical Data: Cough, shortness of breath  PORTABLE CHEST - 1 VIEW  Comparison: 04/13/2012  Findings: Cardiomediastinal silhouette is stable.  Again noted volume loss and chronic lung changes left hemithorax.  Mild hyperinflation right hemithorax is stable.  Bony thorax is unremarkable.  IMPRESSION:  Again noted volume loss and chronic lung changes left hemithorax. Mild hyperinflation right hemithorax is stable.  Bony thorax is unremarkable.   Original Report Authenticated By: Natasha Mead,  M.D.    2-D echo done results pending at this time   Microbiology: No results found for this or any previous visit (from the past 240 hour(s)).   Labs: Results for orders placed during the hospital encounter of 05/13/13 (from the past 48 hour(s))  GLUCOSE, CAPILLARY     Status: Abnormal   Collection Time    05/13/13 10:46 AM      Result Value Range   Glucose-Capillary 144 (*) 70 - 99 mg/dL  CBC WITH DIFFERENTIAL     Status: Abnormal   Collection  Time    05/13/13 11:00 AM      Result Value Range   WBC 10.9 (*) 4.0 - 10.5 K/uL   RBC 4.69  4.22 - 5.81 MIL/uL   Hemoglobin 13.2  13.0 - 17.0 g/dL   HCT 29.5  62.1 - 30.8 %   MCV 83.6  78.0 - 100.0 fL   MCH 28.1  26.0 - 34.0 pg   MCHC 33.7  30.0 - 36.0 g/dL   RDW 65.7  84.6 - 96.2 %   Platelets 170  150 - 400 K/uL   Neutrophils Relative % 83 (*) 43 - 77 %   Neutro Abs 9.0 (*) 1.7 - 7.7 K/uL   Lymphocytes Relative 5 (*) 12 - 46 %   Lymphs Abs 0.5 (*) 0.7 - 4.0 K/uL   Monocytes Relative 9  3 - 12 %   Monocytes Absolute 1.0  0.1 - 1.0 K/uL   Eosinophils Relative 3  0 - 5 %   Eosinophils Absolute 0.3  0.0 - 0.7 K/uL   Basophils Relative 0  0 - 1 %   Basophils Absolute 0.0  0.0 - 0.1 K/uL  BASIC METABOLIC PANEL     Status: Abnormal   Collection Time    05/13/13 11:00 AM      Result Value Range   Sodium 137  135 - 145 mEq/L   Potassium 3.3 (*) 3.5 - 5.1 mEq/L   Chloride 96  96 - 112 mEq/L   CO2 30  19 - 32 mEq/L   Glucose, Bld 140 (*) 70 - 99 mg/dL   BUN 12  6 - 23 mg/dL   Creatinine, Ser 9.52  0.50 - 1.35 mg/dL   Calcium 9.3  8.4 - 84.1 mg/dL   GFR calc non Af Amer 88 (*) >90 mL/min   GFR calc Af Amer >90  >90 mL/min   Comment:            The eGFR has been calculated     using the CKD EPI equation.     This calculation has not been     validated in all clinical     situations.     eGFR's persistently     <90 mL/min signify     possible Chronic Kidney Disease.  POCT I-STAT TROPONIN I     Status: None   Collection Time    05/13/13 11:06 AM      Result Value Range   Troponin i, poc 0.00  0.00 - 0.08 ng/mL   Comment 3            Comment: Due to the release kinetics of cTnI,     a negative result within the first hours     of the onset of symptoms does not rule out     myocardial infarction with certainty.     If myocardial infarction is still suspected,     repeat the test  at appropriate intervals.  POCT I-STAT 3, BLOOD GAS (G3+)     Status: Abnormal   Collection  Time    05/13/13  2:29 PM      Result Value Range   pH, Arterial 7.397  7.350 - 7.450   pCO2 arterial 54.7 (*) 35.0 - 45.0 mmHg   pO2, Arterial 81.0  80.0 - 100.0 mmHg   Bicarbonate 33.7 (*) 20.0 - 24.0 mEq/L   TCO2 35  0 - 100 mmol/L   O2 Saturation 96.0     Acid-Base Excess 7.0 (*) 0.0 - 2.0 mmol/L   Patient temperature 98.2 F     Collection site RADIAL, ALLEN'S TEST ACCEPTABLE     Drawn by RT     Sample type ARTERIAL    TROPONIN I     Status: None   Collection Time    05/13/13  5:05 PM      Result Value Range   Troponin I <0.30  <0.30 ng/mL   Comment:            Due to the release kinetics of cTnI,     a negative result within the first hours     of the onset of symptoms does not rule out     myocardial infarction with certainty.     If myocardial infarction is still suspected,     repeat the test at appropriate intervals.  PRO B NATRIURETIC PEPTIDE     Status: None   Collection Time    05/13/13  5:05 PM      Result Value Range   Pro B Natriuretic peptide (BNP) 253.4  0 - 450 pg/mL  TSH     Status: None   Collection Time    05/13/13  5:19 PM      Result Value Range   TSH 0.644  0.350 - 4.500 uIU/mL  HEMOGLOBIN A1C     Status: None   Collection Time    05/13/13  5:19 PM      Result Value Range   Hemoglobin A1C 5.5  <5.7 %   Comment: (NOTE)                                                                               According to the ADA Clinical Practice Recommendations for 2011, when     HbA1c is used as a screening test:      >=6.5%   Diagnostic of Diabetes Mellitus               (if abnormal result is confirmed)     5.7-6.4%   Increased risk of developing Diabetes Mellitus     References:Diagnosis and Classification of Diabetes Mellitus,Diabetes     Care,2011,34(Suppl 1):S62-S69 and Standards of Medical Care in             Diabetes - 2011,Diabetes Care,2011,34 (Suppl 1):S11-S61.   Mean Plasma Glucose 111  <117 mg/dL  D-DIMER, QUANTITATIVE     Status: None    Collection Time    05/13/13  5:19 PM      Result Value Range   D-Dimer, Quant 0.47  0.00 - 0.48 ug/mL-FEU   Comment:  AT THE INHOUSE ESTABLISHED CUTOFF     VALUE OF 0.48 ug/mL FEU,     THIS ASSAY HAS BEEN DOCUMENTED     IN THE LITERATURE TO HAVE     A SENSITIVITY AND NEGATIVE     PREDICTIVE VALUE OF AT LEAST     98 TO 99%.  THE TEST RESULT     SHOULD BE CORRELATED WITH     AN ASSESSMENT OF THE CLINICAL     PROBABILITY OF DVT / VTE.  GLUCOSE, CAPILLARY     Status: Abnormal   Collection Time    05/13/13  8:56 PM      Result Value Range   Glucose-Capillary 159 (*) 70 - 99 mg/dL  TROPONIN I     Status: None   Collection Time    05/13/13  9:20 PM      Result Value Range   Troponin I <0.30  <0.30 ng/mL   Comment:            Due to the release kinetics of cTnI,     a negative result within the first hours     of the onset of symptoms does not rule out     myocardial infarction with certainty.     If myocardial infarction is still suspected,     repeat the test at appropriate intervals.  TROPONIN I     Status: None   Collection Time    05/14/13  4:50 AM      Result Value Range   Troponin I <0.30  <0.30 ng/mL   Comment:            Due to the release kinetics of cTnI,     a negative result within the first hours     of the onset of symptoms does not rule out     myocardial infarction with certainty.     If myocardial infarction is still suspected,     repeat the test at appropriate intervals.  COMPREHENSIVE METABOLIC PANEL     Status: Abnormal   Collection Time    05/14/13  4:50 AM      Result Value Range   Sodium 138  135 - 145 mEq/L   Potassium 3.7  3.5 - 5.1 mEq/L   Chloride 98  96 - 112 mEq/L   CO2 31  19 - 32 mEq/L   Glucose, Bld 149 (*) 70 - 99 mg/dL   BUN 16  6 - 23 mg/dL   Creatinine, Ser 1.61  0.50 - 1.35 mg/dL   Calcium 9.2  8.4 - 09.6 mg/dL   Total Protein 7.4  6.0 - 8.3 g/dL   Albumin 3.4 (*) 3.5 - 5.2 g/dL   AST 32  0 - 37 U/L   ALT 11  0 - 53  U/L   Alkaline Phosphatase 81  39 - 117 U/L   Total Bilirubin 0.2 (*) 0.3 - 1.2 mg/dL   GFR calc non Af Amer >90  >90 mL/min   GFR calc Af Amer >90  >90 mL/min   Comment:            The eGFR has been calculated     using the CKD EPI equation.     This calculation has not been     validated in all clinical     situations.     eGFR's persistently     <90 mL/min signify     possible Chronic Kidney Disease.  CBC  Status: Abnormal   Collection Time    05/14/13  4:50 AM      Result Value Range   WBC 6.0  4.0 - 10.5 K/uL   RBC 4.55  4.22 - 5.81 MIL/uL   Hemoglobin 12.4 (*) 13.0 - 17.0 g/dL   HCT 16.1 (*) 09.6 - 04.5 %   MCV 84.0  78.0 - 100.0 fL   MCH 27.3  26.0 - 34.0 pg   MCHC 32.5  30.0 - 36.0 g/dL   RDW 40.9  81.1 - 91.4 %   Platelets 185  150 - 400 K/uL     HPI : 79 year old male with history of dementia presents to the ER with shortness of breath. The patient is unable to provide me with any history. From the notes it is gathered that patient had a pneumonia last year lung collapse and had a pneumonectomy for this. He is on home oxygen 2 L continuous basis. He has been having some dyspnea on exertion for the last couple of days. He also is a chronic intermittent cough with a history of COPD. Discussed with his wife who states that the patient has been extremely anxious in the last couple of days    HOSPITAL COURSE: COPD exacerbation  Chest x-ray shows chronic changes in the left hemithorax and mild hyperinflation of the right hemithorax  . The patient was started on IV Solu-Medrol, nebulizer treatments  Empiric antibiotics with Rocephin doxycycline, be avoided Levaquin and azithromycin because of QTC prolongation and drug interaction ABG shows mild hypercapnia but not high enough to warrant BiPAP  2-D echo pending, BNP d-dimer negative  Repeat chest x-ray shows left lung scarring, hyper expansion of the right lung Patient became very anxious and IV Solu-Medrol and this is  being the escalator He will continue with oral prednisone for another 5 days Continue with doxycycline for another 4 days   Anxiety Will continue with when necessary Xanax   Dementia continue with Aricept and Seroquel  Hypokalemia replete   Code Status: full  Family Communication: family updated about patient's clinical progress  Disposition Plan: As above     Discharge Exam:   Blood pressure 141/112, pulse 89, temperature 97.8 F (36.6 C), temperature source Oral, resp. rate 20, height 5\' 11"  (1.803 m), weight 79.379 kg (175 lb), SpO2 98.00%.   Cardiovascular: Normal rate, regular rhythm, normal heart sounds and intact distal pulses.  Pulmonary/Chest: Effort normal and breath sounds normal. No respiratory distress.  Abdominal: Soft. Normal appearance and bowel sounds are normal. She exhibits no distension. There is no tenderness.  Musculoskeletal: She exhibits no edema and no tenderness.  Neurological: She is alert. No cranial nerve deficit.         SignedRicharda Overlie 05/15/2013, 8:21 AM

## 2013-05-15 NOTE — Care Management Note (Signed)
   CARE MANAGEMENT NOTE 05/15/2013  Patient:  [name], [name]   Account Number:  [id number]  Date Initiated:  05/15/2013  Documentation initiated by:  Avery Eustice  Subjective/Objective Assessment:   Pt for d/c to home with wife to assist will need HH.     Action/Plan:   Met with pt and wife, they have used [facility] previously and wish to use that agency again for [facility], PT and HH aide. [facility] notified.   Anticipated DC Date:  05/15/2013   Anticipated DC Plan:  HOME W HOME HEALTH SERVICES         Choice offered to / List presented to:  C-3 Spouse        HH arranged  HH-1 RN  HH-2 PT  HH-4 NURSE'S AIDE      HH agency  [facility]   Status of service:  Completed, signed off Medicare Important Message given?   (If response is "NO", the following Medicare IM given date fields will be blank) Date Medicare IM given:   Date Additional Medicare IM given:    Discharge Disposition:  HOME W HOME HEALTH SERVICES  Per UR Regulation:    If discussed at Long Length of Stay Meetings, dates discussed:    Comments:  05/15/2013 Met with pt and wife, [facility] selected for [facility], PT and aide. No DME needs. [facility] to provide services within 48-72 hr post d/c. Johny Shock RN MPH, case manager, [phone]

## 2013-05-15 NOTE — Progress Notes (Signed)
Physical Therapy Treatment Patient Details Name: Jaquaveon Bilal MRN: [id number] DOB: 1931-12-25 Today's Date: 05/15/2013 Time: 4034-7425 PT Time Calculation (min): 25 min  PT Assessment / Plan / Recommendation  PT Comments   Pt required min assist for LOB during ambulation and declined using RW today.  Pt also performed exercises in recliner which spouse reports she can assist pt with at home as she does them as well for arthritic knees.  Spouse reports d/c home today and states pt does not need HHPT.  Continue to recommend f/u PT upon d/c.   Follow Up Recommendations  SNF;Supervision/Assistance - 24 hour (however d/c plans for home, spouse reports no HHPT)     Does the patient have the potential to tolerate intense rehabilitation     Barriers to Discharge        Equipment Recommendations  None recommended by PT;Other (comment) (pt refusing to use RW)    Recommendations for Other Services    Frequency     Progress towards PT Goals Progress towards PT goals: Progressing toward goals  Plan Current plan remains appropriate    Precautions / Restrictions Precautions Precautions: Fall   Pertinent Vitals/Pain Remained on 2L O2 [employer]    Mobility  Bed Mobility Bed Mobility: Supine to Sit Supine to Sit: 5: Supervision;HOB elevated (30 degrees) Transfers Transfers: Sit to Stand;Stand to Sit Sit to Stand: 4: Min guard;From bed;With upper extremity assist;From chair/3-in-1 Stand to Sit: 4: Min guard;To chair/3-in-1;With upper extremity assist Details for Transfer Assistance: min/guard for stability, tremulous trunk and extremities Ambulation/Gait Ambulation/Gait Assistance: 4: Min assist;4: Min guard Ambulation Distance (Feet): 200 Feet Assistive device: None Ambulation/Gait Assistance Details: min assist for LOB x1 otherwise min/guard, pt pushed O2 tank, verbal cues for breathing in through nose, continues to be easily distracted with cues for redirection Gait Pattern: Step-through  pattern;Decreased stride length;Narrow base of support;Shuffle Gait velocity: decreased Stairs: No    Exercises General Exercises - Lower Extremity Ankle Circles/Pumps: AROM;Both;15 reps;Seated Long Arc Quad: AROM;Both;15 reps;Seated Hip ABduction/ADduction: AROM;Both;10 reps;Supine Straight Leg Raises: AROM;Both;5 reps Hip Flexion/Marching: AROM;Seated;Both;15 reps   PT Diagnosis:    PT Problem List:   PT Treatment Interventions:     PT Goals (current goals can now be found in the care plan section)    Visit Information  Last PT Received On: 05/15/13 Assistance Needed: +1    Subjective Data      Cognition  Cognition Arousal/Alertness: Awake/alert Behavior During Therapy: WFL for tasks assessed/performed Overall Cognitive Status: History of cognitive impairments - at baseline (baseline dementia)    Balance     End of Session PT - End of Session Equipment Utilized During Treatment: Gait belt;Oxygen Activity Tolerance: Patient tolerated treatment well Patient left: in chair;with call bell/phone within reach;with family/visitor present   GP     Nayely Dingus,KATHrine E 05/15/2013, 12:22 PM Zenovia Jarred, PT, DPT 05/15/2013 Pager: [phone]

## 2014-12-01 ENCOUNTER — Inpatient Hospital Stay (HOSPITAL_COMMUNITY)
Admission: EM | Admit: 2014-12-01 | Discharge: 2014-12-09 | DRG: 193 | Disposition: A | Discharge status: Skilled Nursing Facility | Payer: Medicare Other | Attending: Internal Medicine | Admitting: Internal Medicine

## 2014-12-01 ENCOUNTER — Emergency Department (HOSPITAL_COMMUNITY): Payer: Medicare Other

## 2014-12-01 ENCOUNTER — Encounter (HOSPITAL_COMMUNITY): Payer: Self-pay | Admitting: *Deleted

## 2014-12-01 DIAGNOSIS — J9601 Acute respiratory failure with hypoxia: Secondary | ICD-10-CM | POA: Diagnosis present

## 2014-12-01 DIAGNOSIS — Z7982 Long term (current) use of aspirin: Secondary | ICD-10-CM | POA: Diagnosis not present

## 2014-12-01 DIAGNOSIS — Z79899 Other long term (current) drug therapy: Secondary | ICD-10-CM

## 2014-12-01 DIAGNOSIS — I272 Other secondary pulmonary hypertension: Secondary | ICD-10-CM | POA: Diagnosis present

## 2014-12-01 DIAGNOSIS — J9621 Acute and chronic respiratory failure with hypoxia: Secondary | ICD-10-CM | POA: Diagnosis present

## 2014-12-01 DIAGNOSIS — Z66 Do not resuscitate: Secondary | ICD-10-CM | POA: Diagnosis not present

## 2014-12-01 DIAGNOSIS — I248 Other forms of acute ischemic heart disease: Secondary | ICD-10-CM | POA: Diagnosis present

## 2014-12-01 DIAGNOSIS — J441 Chronic obstructive pulmonary disease with (acute) exacerbation: Secondary | ICD-10-CM

## 2014-12-01 DIAGNOSIS — Z7951 Long term (current) use of inhaled steroids: Secondary | ICD-10-CM

## 2014-12-01 DIAGNOSIS — Z87891 Personal history of nicotine dependence: Secondary | ICD-10-CM

## 2014-12-01 DIAGNOSIS — J471 Bronchiectasis with (acute) exacerbation: Secondary | ICD-10-CM | POA: Diagnosis present

## 2014-12-01 DIAGNOSIS — J189 Pneumonia, unspecified organism: Principal | ICD-10-CM | POA: Diagnosis present

## 2014-12-01 DIAGNOSIS — R7989 Other specified abnormal findings of blood chemistry: Secondary | ICD-10-CM

## 2014-12-01 DIAGNOSIS — I10 Essential (primary) hypertension: Secondary | ICD-10-CM | POA: Diagnosis present

## 2014-12-01 DIAGNOSIS — J9622 Acute and chronic respiratory failure with hypercapnia: Secondary | ICD-10-CM | POA: Diagnosis present

## 2014-12-01 DIAGNOSIS — G4700 Insomnia, unspecified: Secondary | ICD-10-CM | POA: Diagnosis present

## 2014-12-01 DIAGNOSIS — Z9981 Dependence on supplemental oxygen: Secondary | ICD-10-CM

## 2014-12-01 DIAGNOSIS — F0391 Unspecified dementia with behavioral disturbance: Secondary | ICD-10-CM | POA: Diagnosis present

## 2014-12-01 DIAGNOSIS — G9340 Encephalopathy, unspecified: Secondary | ICD-10-CM | POA: Diagnosis present

## 2014-12-01 DIAGNOSIS — R4182 Altered mental status, unspecified: Secondary | ICD-10-CM | POA: Diagnosis present

## 2014-12-01 DIAGNOSIS — F03C0 Unspecified dementia, severe, without behavioral disturbance, psychotic disturbance, mood disturbance, and anxiety: Secondary | ICD-10-CM | POA: Diagnosis present

## 2014-12-01 DIAGNOSIS — J9811 Atelectasis: Secondary | ICD-10-CM | POA: Diagnosis present

## 2014-12-01 DIAGNOSIS — I252 Old myocardial infarction: Secondary | ICD-10-CM

## 2014-12-01 DIAGNOSIS — F419 Anxiety disorder, unspecified: Secondary | ICD-10-CM | POA: Diagnosis present

## 2014-12-01 DIAGNOSIS — R778 Other specified abnormalities of plasma proteins: Secondary | ICD-10-CM | POA: Diagnosis present

## 2014-12-01 DIAGNOSIS — F0390 Unspecified dementia without behavioral disturbance: Secondary | ICD-10-CM | POA: Diagnosis present

## 2014-12-01 LAB — I-STAT VENOUS BLOOD GAS, ED
ACID-BASE EXCESS: 6 mmol/L — AB (ref 0.0–2.0)
BICARBONATE: 34.2 meq/L — AB (ref 20.0–24.0)
O2 Saturation: 30 %
PCO2 VEN: 61.7 mmHg — AB (ref 45.0–50.0)
PH VEN: 7.353 — AB (ref 7.250–7.300)
PO2 VEN: 21 mmHg — AB (ref 30.0–45.0)
TCO2: 36 mmol/L (ref 0–100)

## 2014-12-01 LAB — CBC
HEMATOCRIT: 39.8 % (ref 39.0–52.0)
Hemoglobin: 12.3 g/dL — ABNORMAL LOW (ref 13.0–17.0)
MCH: 26.2 pg (ref 26.0–34.0)
MCHC: 30.9 g/dL (ref 30.0–36.0)
MCV: 84.7 fL (ref 78.0–100.0)
Platelets: 185 10*3/uL (ref 150–400)
RBC: 4.7 MIL/uL (ref 4.22–5.81)
RDW: 14.4 % (ref 11.5–15.5)
WBC: 11.4 10*3/uL — AB (ref 4.0–10.5)

## 2014-12-01 LAB — URINE MICROSCOPIC-ADD ON

## 2014-12-01 LAB — URINALYSIS, ROUTINE W REFLEX MICROSCOPIC
Glucose, UA: NEGATIVE mg/dL
KETONES UR: 15 mg/dL — AB
Leukocytes, UA: NEGATIVE
Nitrite: NEGATIVE
Protein, ur: >300 mg/dL — AB
SPECIFIC GRAVITY, URINE: 1.036 — AB (ref 1.005–1.030)
Urobilinogen, UA: 0.2 mg/dL (ref 0.0–1.0)
pH: 5.5 (ref 5.0–8.0)

## 2014-12-01 LAB — CREATININE, SERUM
Creatinine, Ser: 0.91 mg/dL (ref 0.50–1.35)
GFR calc Af Amer: >90 mL/min (ref >90)
GFR, EST NON AFRICAN AMERICAN: 78 mL/min — AB (ref >90)

## 2014-12-01 LAB — MRSA PCR SCREENING: MRSA by PCR: NEGATIVE

## 2014-12-01 LAB — LACTIC ACID, PLASMA: LACTIC ACID, VENOUS: 2.1 mmol/L — AB (ref 0.5–2.0)

## 2014-12-01 LAB — I-STAT TROPONIN, ED: TROPONIN I, POC: 0.33 ng/mL — AB (ref 0.00–0.08)

## 2014-12-01 LAB — TSH: TSH: 1.43 u[IU]/mL (ref 0.350–4.500)

## 2014-12-01 LAB — TROPONIN I: Troponin I: 0.39 ng/mL — ABNORMAL HIGH (ref <0.031)

## 2014-12-01 MED ORDER — ASPIRIN 81 MG PO TABS: 81.0000 mg | ORAL_TABLET | Freq: Every day | ORAL | Status: DC

## 2014-12-01 MED ORDER — SODIUM CHLORIDE 0.9 % IJ SOLN
3.0000 mL | Freq: Two times a day (BID) | INTRAMUSCULAR | Status: DC
  Administered 2014-12-01: 10 mL via INTRAVENOUS
  Administered 2014-12-02 – 2014-12-03 (×2): 3 mL via INTRAVENOUS

## 2014-12-01 MED ORDER — QUETIAPINE FUMARATE 100 MG PO TABS
100.0000 mg | ORAL_TABLET | Freq: Every day | ORAL | Status: DC
  Administered 2014-12-02 – 2014-12-08 (×7): 100 mg via ORAL
  Filled 2014-12-01 (×11): qty 1

## 2014-12-01 MED ORDER — IPRATROPIUM-ALBUTEROL 0.5-2.5 (3) MG/3ML IN SOLN
3.0000 mL | Freq: Two times a day (BID) | RESPIRATORY_TRACT | Status: DC
  Administered 2014-12-01 – 2014-12-03 (×4): 3 mL via RESPIRATORY_TRACT
  Filled 2014-12-01 (×4): qty 3

## 2014-12-01 MED ORDER — DEXTROSE 5 % IV SOLN
1.0000 g | Freq: Once | INTRAVENOUS | Status: AC
  Administered 2014-12-01: 1 g via INTRAVENOUS
  Filled 2014-12-01: qty 10

## 2014-12-01 MED ORDER — ONDANSETRON HCL 4 MG/2ML IJ SOLN: 4.0000 mg | Freq: Four times a day (QID) | INTRAMUSCULAR | Status: DC | PRN

## 2014-12-01 MED ORDER — FOLIC ACID 1 MG PO TABS
1.0000 mg | ORAL_TABLET | Freq: Every day | ORAL | Status: DC
  Administered 2014-12-02 – 2014-12-09 (×8): 1 mg via ORAL
  Filled 2014-12-01 (×8): qty 1

## 2014-12-01 MED ORDER — IPRATROPIUM-ALBUTEROL 0.5-2.5 (3) MG/3ML IN SOLN
3.0000 mL | Freq: Once | RESPIRATORY_TRACT | Status: AC
  Administered 2014-12-01: 3 mL via RESPIRATORY_TRACT
  Filled 2014-12-01: qty 3

## 2014-12-01 MED ORDER — PREDNISONE 20 MG PO TABS
40.0000 mg | ORAL_TABLET | Freq: Once | ORAL | Status: DC
Start: 2014-12-01 — End: 2014-12-02
  Filled 2014-12-01: qty 2

## 2014-12-01 MED ORDER — DONEPEZIL HCL 5 MG PO TABS
5.0000 mg | ORAL_TABLET | Freq: Every day | ORAL | Status: DC
  Administered 2014-12-02 – 2014-12-08 (×7): 5 mg via ORAL
  Filled 2014-12-01 (×10): qty 1

## 2014-12-01 MED ORDER — METHYLPREDNISOLONE SODIUM SUCC 125 MG IJ SOLR
60.0000 mg | Freq: Four times a day (QID) | INTRAMUSCULAR | Status: DC
  Administered 2014-12-01 – 2014-12-02 (×2): 60 mg via INTRAVENOUS
  Filled 2014-12-01: qty 2
  Filled 2014-12-01 (×3): qty 0.96
  Filled 2014-12-01: qty 2
  Filled 2014-12-01: qty 0.96

## 2014-12-01 MED ORDER — ALUM & MAG HYDROXIDE-SIMETH 200-200-20 MG/5ML PO SUSP: 30.0000 mL | Freq: Four times a day (QID) | ORAL | Status: DC | PRN

## 2014-12-01 MED ORDER — PREDNISONE 20 MG PO TABS
40.0000 mg | ORAL_TABLET | Freq: Once | ORAL | Status: DC
  Filled 2014-12-01: qty 2

## 2014-12-01 MED ORDER — HYDROCHLOROTHIAZIDE 25 MG PO TABS
25.0000 mg | ORAL_TABLET | Freq: Every day | ORAL | Status: DC
  Administered 2014-12-02 – 2014-12-03 (×2): 25 mg via ORAL
  Filled 2014-12-01 (×3): qty 1

## 2014-12-01 MED ORDER — BISACODYL 10 MG RE SUPP: 10.0000 mg | Freq: Every day | RECTAL | Status: DC | PRN

## 2014-12-01 MED ORDER — ONDANSETRON HCL 4 MG PO TABS
4.0000 mg | ORAL_TABLET | Freq: Four times a day (QID) | ORAL | Status: DC | PRN
Start: 2014-12-01 — End: 2014-12-09

## 2014-12-01 MED ORDER — LORAZEPAM 2 MG/ML IJ SOLN
0.5000 mg | Freq: Two times a day (BID) | INTRAMUSCULAR | Status: DC | PRN
  Administered 2014-12-01 – 2014-12-02 (×2): 0.5 mg via INTRAVENOUS
  Filled 2014-12-01 (×5): qty 1

## 2014-12-01 MED ORDER — SODIUM CHLORIDE 0.9 % IV SOLN
INTRAVENOUS | Status: DC
  Administered 2014-12-01: 22:00:00 via INTRAVENOUS
  Administered 2014-12-05 – 2014-12-06 (×2): 50 mL/h via INTRAVENOUS

## 2014-12-01 MED ORDER — DEXTROSE 5 % IV SOLN
500.0000 mg | INTRAVENOUS | Status: DC
  Administered 2014-12-02 – 2014-12-07 (×6): 500 mg via INTRAVENOUS
  Filled 2014-12-01 (×8): qty 500

## 2014-12-01 MED ORDER — DOCUSATE SODIUM 100 MG PO CAPS
100.0000 mg | ORAL_CAPSULE | Freq: Two times a day (BID) | ORAL | Status: DC
  Administered 2014-12-04 – 2014-12-09 (×11): 100 mg via ORAL
  Filled 2014-12-01 (×16): qty 1

## 2014-12-01 MED ORDER — ADULT MULTIVITAMIN W/MINERALS CH
1.0000 | ORAL_TABLET | Freq: Every day | ORAL | Status: DC
  Administered 2014-12-02 – 2014-12-09 (×8): 1 via ORAL
  Filled 2014-12-01 (×8): qty 1

## 2014-12-01 MED ORDER — VITAMIN B-1 100 MG PO TABS
100.0000 mg | ORAL_TABLET | Freq: Every day | ORAL | Status: DC
  Administered 2014-12-02 – 2014-12-09 (×8): 100 mg via ORAL
  Filled 2014-12-01 (×9): qty 1

## 2014-12-01 MED ORDER — ASPIRIN 81 MG PO CHEW
81.0000 mg | CHEWABLE_TABLET | Freq: Every day | ORAL | Status: DC
  Administered 2014-12-02 – 2014-12-09 (×8): 81 mg via ORAL
  Filled 2014-12-01 (×8): qty 1

## 2014-12-01 MED ORDER — CEFTRIAXONE SODIUM IN DEXTROSE 20 MG/ML IV SOLN
1.0000 g | INTRAVENOUS | Status: DC
  Administered 2014-12-02 – 2014-12-07 (×6): 1 g via INTRAVENOUS
  Filled 2014-12-01 (×8): qty 50

## 2014-12-01 MED ORDER — SODIUM CHLORIDE 0.9 % IV BOLUS (SEPSIS)
500.0000 mL | Freq: Once | INTRAVENOUS | Status: AC
  Administered 2014-12-01: 500 mL via INTRAVENOUS

## 2014-12-01 MED ORDER — HALOPERIDOL LACTATE 5 MG/ML IJ SOLN
5.0000 mg | Freq: Once | INTRAMUSCULAR | Status: AC
  Administered 2014-12-01: 5 mg via INTRAVENOUS
  Filled 2014-12-01: qty 1

## 2014-12-01 MED ORDER — ACETAMINOPHEN 325 MG PO TABS
650.0000 mg | ORAL_TABLET | Freq: Four times a day (QID) | ORAL | Status: DC | PRN
  Administered 2014-12-05 – 2014-12-08 (×4): 650 mg via ORAL
  Filled 2014-12-01 (×4): qty 2

## 2014-12-01 MED ORDER — AZITHROMYCIN 500 MG IV SOLR
500.0000 mg | Freq: Once | INTRAVENOUS | Status: AC
  Administered 2014-12-01: 500 mg via INTRAVENOUS
  Filled 2014-12-01: qty 500

## 2014-12-01 MED ORDER — MOMETASONE FURO-FORMOTEROL FUM 100-5 MCG/ACT IN AERO
2.0000 | INHALATION_SPRAY | Freq: Two times a day (BID) | RESPIRATORY_TRACT | Status: DC
  Administered 2014-12-02: 2 via RESPIRATORY_TRACT
  Filled 2014-12-01: qty 8.8

## 2014-12-01 MED ORDER — HEPARIN SODIUM (PORCINE) 5000 UNIT/ML IJ SOLN
5000.0000 [IU] | Freq: Three times a day (TID) | INTRAMUSCULAR | Status: DC
  Administered 2014-12-01 – 2014-12-09 (×22): 5000 [IU] via SUBCUTANEOUS
  Filled 2014-12-01 (×27): qty 1

## 2014-12-01 MED ORDER — ACETAMINOPHEN 650 MG RE SUPP: 650.0000 mg | Freq: Four times a day (QID) | RECTAL | Status: DC | PRN

## 2014-12-01 NOTE — ED Notes (Signed)
Pt arrives via EMS from home. Family called out stating that pt was feeling SOB. Pt wears 2L [place] chronically. Upon EMS arrival pt was 83%. Once placed on 5L [place] he came to 97%. pt now 94% on 2L [place] to ED.

## 2014-12-01 NOTE — ED Notes (Signed)
MD at bedside with family
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
## 2014-12-01 NOTE — ED Notes (Signed)
Lactic acid level read to Dr Craige CottaSchmitt.
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
## 2014-12-02 ENCOUNTER — Inpatient Hospital Stay (HOSPITAL_COMMUNITY): Payer: Medicare Other

## 2014-12-02 DIAGNOSIS — R401 Stupor: Secondary | ICD-10-CM

## 2014-12-02 DIAGNOSIS — R410 Disorientation, unspecified: Secondary | ICD-10-CM

## 2014-12-02 DIAGNOSIS — R778 Other specified abnormalities of plasma proteins: Secondary | ICD-10-CM | POA: Diagnosis present

## 2014-12-02 DIAGNOSIS — J471 Bronchiectasis with (acute) exacerbation: Secondary | ICD-10-CM | POA: Diagnosis present

## 2014-12-02 DIAGNOSIS — J9620 Acute and chronic respiratory failure, unspecified whether with hypoxia or hypercapnia: Secondary | ICD-10-CM

## 2014-12-02 DIAGNOSIS — J189 Pneumonia, unspecified organism: Secondary | ICD-10-CM | POA: Diagnosis present

## 2014-12-02 DIAGNOSIS — R7989 Other specified abnormal findings of blood chemistry: Secondary | ICD-10-CM

## 2014-12-02 DIAGNOSIS — J9601 Acute respiratory failure with hypoxia: Secondary | ICD-10-CM

## 2014-12-02 DIAGNOSIS — J9811 Atelectasis: Secondary | ICD-10-CM | POA: Diagnosis present

## 2014-12-02 DIAGNOSIS — I10 Essential (primary) hypertension: Secondary | ICD-10-CM | POA: Diagnosis present

## 2014-12-02 LAB — CLOSTRIDIUM DIFFICILE BY PCR: Toxigenic C. Difficile by PCR: NEGATIVE

## 2014-12-02 LAB — COMPREHENSIVE METABOLIC PANEL
ALT: 30 U/L (ref 0–53)
AST: 127 U/L — ABNORMAL HIGH (ref 0–37)
Albumin: 3.3 g/dL — ABNORMAL LOW (ref 3.5–5.2)
Alkaline Phosphatase: 65 U/L (ref 39–117)
Anion gap: 12 (ref 5–15)
BUN: 20 mg/dL (ref 6–23)
CO2: 29 mmol/L (ref 19–32)
Calcium: 8.6 mg/dL (ref 8.4–10.5)
Chloride: 97 mmol/L (ref 96–112)
Creatinine, Ser: 0.84 mg/dL (ref 0.50–1.35)
GFR calc Af Amer: >90 mL/min (ref >90)
GFR calc non Af Amer: 81 mL/min — ABNORMAL LOW (ref >90)
Glucose, Bld: 151 mg/dL — ABNORMAL HIGH (ref 70–99)
Potassium: 3.7 mmol/L (ref 3.5–5.1)
Sodium: 138 mmol/L (ref 135–145)
Total Bilirubin: 0.4 mg/dL (ref 0.3–1.2)
Total Protein: 6.9 g/dL (ref 6.0–8.3)

## 2014-12-02 LAB — POCT I-STAT 3, ART BLOOD GAS (G3+)
ACID-BASE EXCESS: 6 mmol/L — AB (ref 0.0–2.0)
BICARBONATE: 33.3 meq/L — AB (ref 20.0–24.0)
O2 SAT: 99 %
PCO2 ART: 58.7 mmHg — AB (ref 35.0–45.0)
PH ART: 7.364 (ref 7.350–7.450)
PO2 ART: 133 mmHg — AB (ref 80.0–100.0)
TCO2: 35 mmol/L (ref 0–100)

## 2014-12-02 LAB — HEMOGLOBIN A1C
Hgb A1c MFr Bld: 5.7 % — ABNORMAL HIGH (ref <5.7)
Mean Plasma Glucose: 117 mg/dL — ABNORMAL HIGH (ref <117)

## 2014-12-02 LAB — CBC
HCT: 37.9 % — ABNORMAL LOW (ref 39.0–52.0)
Hemoglobin: 12 g/dL — ABNORMAL LOW (ref 13.0–17.0)
MCH: 26.8 pg (ref 26.0–34.0)
MCHC: 31.7 g/dL (ref 30.0–36.0)
MCV: 84.6 fL (ref 78.0–100.0)
PLATELETS: 185 10*3/uL (ref 150–400)
RBC: 4.48 MIL/uL (ref 4.22–5.81)
RDW: 14.5 % (ref 11.5–15.5)
WBC: 6.5 10*3/uL (ref 4.0–10.5)

## 2014-12-02 LAB — GLUCOSE, CAPILLARY
Glucose-Capillary: 166 mg/dL — ABNORMAL HIGH (ref 70–99)
Glucose-Capillary: 120 mg/dL — ABNORMAL HIGH (ref 70–99)
Glucose-Capillary: 130 mg/dL — ABNORMAL HIGH (ref 70–99)

## 2014-12-02 LAB — INFLUENZA PANEL BY PCR (TYPE A & B)
H1N1FLUPCR: NOT DETECTED
INFLAPCR: NEGATIVE
Influenza B By PCR: NEGATIVE

## 2014-12-02 LAB — PROCALCITONIN: PROCALCITONIN: 0.1 ng/mL

## 2014-12-02 LAB — TROPONIN I
Troponin I: 0.26 ng/mL — ABNORMAL HIGH (ref <0.031)
Troponin I: 0.19 ng/mL — ABNORMAL HIGH (ref <0.031)

## 2014-12-02 LAB — STREP PNEUMONIAE URINARY ANTIGEN: STREP PNEUMO URINARY ANTIGEN: NEGATIVE

## 2014-12-02 MED ORDER — LORAZEPAM 2 MG/ML IJ SOLN
0.5000 mg | Freq: Once | INTRAMUSCULAR | Status: AC
  Administered 2014-12-02: 0.5 mg via INTRAVENOUS

## 2014-12-02 MED ORDER — BUDESONIDE 0.25 MG/2ML IN SUSP
0.2500 mg | Freq: Two times a day (BID) | RESPIRATORY_TRACT | Status: DC
  Administered 2014-12-02 – 2014-12-09 (×14): 0.25 mg via RESPIRATORY_TRACT
  Filled 2014-12-02 (×16): qty 2

## 2014-12-02 MED ORDER — HALOPERIDOL LACTATE 5 MG/ML IJ SOLN
2.5000 mg | Freq: Once | INTRAMUSCULAR | Status: AC
  Administered 2014-12-02: 2.5 mg via INTRAVENOUS
  Filled 2014-12-02: qty 1

## 2014-12-02 MED ORDER — METHYLPREDNISOLONE SODIUM SUCC 125 MG IJ SOLR
60.0000 mg | Freq: Two times a day (BID) | INTRAMUSCULAR | Status: DC
  Administered 2014-12-02 – 2014-12-03 (×3): 60 mg via INTRAVENOUS
  Filled 2014-12-02 (×2): qty 0.96

## 2014-12-02 MED ORDER — DIPHENOXYLATE-ATROPINE 2.5-0.025 MG PO TABS
2.0000 | ORAL_TABLET | Freq: Once | ORAL | Status: AC
  Administered 2014-12-02: 2 via ORAL
  Filled 2014-12-02: qty 2

## 2014-12-02 MED ORDER — CLONAZEPAM 0.5 MG PO TABS
0.5000 mg | ORAL_TABLET | Freq: Three times a day (TID) | ORAL | Status: DC | PRN
  Administered 2014-12-02: 0.5 mg via ORAL
  Filled 2014-12-02: qty 1

## 2014-12-02 NOTE — Consult Note (Signed)
Name: [name] MRN: [id number] DOB: 09-13-1932    ADMISSION DATE:  12/01/2014 CONSULTATION DATE:  12/02/2014  REFERRING MD :  Joseph Art  CHIEF COMPLAINT: AMS  BRIEF PATIENT DESCRIPTION: 81 year old male presented to [facility] ED 1/25 with AMS. Also with cough and wheeze of exam. Admitted to [facility] and placed on BiPAP. CXR shows L lung collapse.  SIGNIFICANT EVENTS    STUDIES:  1/25 CT head > negative 1/25 CXR > bronchiectasis with L lung collapse   HISTORY OF PRESENT ILLNESS:  81 year old male with PMH COPD, cylindrical bronchiectasis (has seen PW in past), L lung collapse, PNA when he was young, on home O2, dementia, and anxiety. He presented to [facility] ED 1/25 with AMS. Per family he can normally perform most ADLs independently, however does have some sundowning issues. In ED he was found to have cough and wheeze. Was hypercarbic in ED and started on BiPAP. CXR showed L lung collapse. He was admitted to [facility] and treated with ABX. PCCM to see. Of note, he had a similar episode in 2012 with L lung collapse. FOB was negative for CA at that time.   PAST MEDICAL HISTORY :   has a past medical history of Syncopal episodes; Collapse of left lung (~ 2011); Emphysema; Delirium; Hypertension; Non-ST elevation MI (NSTEMI); Bronchiectasis; COPD (chronic obstructive pulmonary disease); Asthma; Pneumonia; Exertional shortness of breath; On home oxygen therapy; Anxiety (09/2011); and Dementia.  has past surgical history that includes Cardiac catheterization and Inguinal hernia repair (Right, ?1970's). Prior to Admission medications   Medication Sig Start Date End Date Taking? Authorizing Provider  ALPRAZolam Prudy Feeler) 0.5 MG tablet Take 0.5-1 tablets (0.25-0.5 mg total) by mouth at bedtime as needed for sleep. 05/15/13   Richarda Overlie, MD  aspirin 81 MG tablet Take 81 mg by mouth daily.      Historical Provider, MD  donepezil (ARICEPT) 5 MG tablet Take 5 mg by mouth at bedtime as needed.    Historical Provider, MD    Fluticasone-Salmeterol (ADVAIR) 250-50 MCG/DOSE AEPB Inhale 1 puff into the lungs 2 (two) times daily. 10/21/11   Mcarthur Rossetti Angiulli, PA-C  hydrochlorothiazide 25 MG tablet Take 25 mg by mouth daily.     Historical Provider, MD  ipratropium-albuterol (DUONEB) 0.5-2.5 (3) MG/3ML SOLN Take 3 mLs by nebulization 2 (two) times daily.      Historical Provider, MD  Multiple Vitamin (MULTIVITAMIN) tablet Take 1 tablet by mouth daily.      Historical Provider, MD  QUEtiapine (SEROQUEL) 100 MG tablet Take 1 tablet (100 mg total) by mouth at bedtime. 10/18/11 11/17/11  Daniel J Angiulli, PA-C  QUEtiapine (SEROQUEL) 100 MG tablet Take 100 mg by mouth at bedtime.    Historical Provider, MD   No Known Allergies  FAMILY HISTORY:  family history is not on file. SOCIAL HISTORY:  reports that he quit smoking about 36 years ago. His smoking use included Cigarettes and Cigars. He has a 30 pack-year smoking history. His smokeless tobacco use includes Snuff and Chew. He reports that he does not drink alcohol or use illicit drugs.  REVIEW OF SYSTEMS:     Bolds are positive  Constitutional: weight loss, gain, night sweats, Fevers, chills, fatigue .  HEENT: headaches, Sore throat, sneezing, nasal congestion, post nasal drip, Difficulty swallowing, Tooth/dental problems, visual complaints visual changes, ear ache CV:  chest pain, radiates: ,Orthopnea, PND, swelling in lower extremities, dizziness, palpitations, syncope.  GI  heartburn, indigestion, abdominal pain, nausea, vomiting, diarrhea, change  in bowel habits, loss of appetite, bloody stools.  Resp: cough, productive: , hemoptysis, dyspnea, chest pain, pleuritic.  Skin: rash or itching or icterus GU: dysuria, change in color of urine, urgency or frequency. flank pain, hematuria  MS: joint pain or swelling. decreased range of motion  Psych: change in mood or affect. depression or anxiety.  Neuro: difficulty with speech, weakness, numbness, ataxia     SUBJECTIVE:   VITAL SIGNS: Temp:  [96.8 F (36 C)-99.4 F (37.4 C)] 97.7 F (36.5 C) (01/26 1235) Pulse Rate:  [59-110] 65 (01/26 1235) Resp:  [16-29] 16 (01/26 1235) BP: (108-148)/(50-92) 141/55 mmHg (01/26 1235) SpO2:  [91 %-100 %] 97 % (01/26 1235) Weight:  [70.3 kg (154 lb 15.7 oz)] 70.3 kg (154 lb 15.7 oz) (01/25 2010)  PHYSICAL EXAMINATION: General:  Elderly male in NAD Neuro:  Spontaneously awake, alert. Disoriented HEENT:  [place]/AT, PERRL, no JVD Cardiovascular:  RRR, no MRG Lungs:  L lung with coarse rhonci Abdomen:  Soft, non-tender, non-distended Musculoskeletal:  No acute deformity or ROM limitation Skin:  Grossly intact   Recent Labs Lab 12/01/14 2044 12/02/14 0820  NA  --  138  K  --  3.7  CL  --  97  CO2  --  29  BUN  --  20  CREATININE 0.91 0.84  GLUCOSE  --  151*    Recent Labs Lab 12/01/14 1639 12/02/14 0820  HGB 12.3* 12.0*  HCT 39.8 37.9*  WBC 11.4* 6.5  PLT 185 185   Dg Chest 2 View (if Patient Has Fever And/or Copd)  12/01/2014   CLINICAL DATA:  81 year old male with respiratory distress for 1 day. Initial encounter. Current history of chronic left lung bronchiectasis and collapse.  EXAM: CHEST  2 VIEW  COMPARISON:  Chest radiograph 05/14/2013 and earlier, including chest CT 09/28/2011.  FINDINGS: Chronic left lung volume loss with leftward shift of the mediastinum and extension of aerated right lung to the left of midline again noted. Since 2014, the relatively poor ventilation in the left lung has not significantly changed. No superimposed pneumothorax or pulmonary edema identified. No right pleural effusion. However, there is suggestion of subtle increased reticular nodular density in the right lung compared to prior studies. The tracheal air column appears stable. Cardiac and mediastinal contours are largely obscured as before. No acute osseous abnormality identified.  IMPRESSION: Chronic severe left lung disease with bronchiectasis and poor  ventilation of that lung, not significantly changed since 2014. Suggestion of superimposed reticulonodular opacity in the right lung such that acute viral/atypical right lung infection is possible.   Electronically Signed   By: Augusto GambleLee  Hall M.D.   On: 12/01/2014 17:48   Ct Head Wo Contrast  12/01/2014   CLINICAL DATA:  Altered mental status.  Initial encounter.  EXAM: CT HEAD WITHOUT CONTRAST  TECHNIQUE: Contiguous axial images were obtained from the base of the skull through the vertex without intravenous contrast.  COMPARISON:  11/02/2011.  FINDINGS: The study was repeated 3 times due to motion during the scan. The study is diagnostic. No mass lesion, mass effect, midline shift, hydrocephalus, hemorrhage. No acute territorial cortical ischemia/infarct. Atrophy and chronic ischemic white matter disease is present. The calvarium appears intact. Grossly, the visible paranasal sinuses appear within normal limits. Dense intracranial atherosclerosis is present.  IMPRESSION: 1. Mildly motion degraded study. 2. Atrophy and chronic ischemic white matter disease without acute intracranial abnormality.   Electronically Signed   By: Andreas NewportGeoffrey  Lamke M.D.   On: 12/01/2014  19:41    ASSESSMENT / PLAN:  Acute on chronic hypercarbic respiratory failure (on home O2) Cylindrical bronchiectasis  L lung collapse COPD +/- acute exacerbation -Supplemental O2 to maintain SpO2 > 90% -PRN BiPAP -CT chest -Continue ABX for now (ceftriaxone, azithro) -Nebs adjusted -Systemic steroids, rapid wean. -PCT algorithm to hopefully limit antibiotics  Joneen Roach, AGACNP-BC [facility] Pager [phone] or [phone]  STAFF NOTE: Cindi Carbon, MD FACP have personally reviewed patient's available data, including medical history, events of note, physical examination and test results as part of my evaluation. I have discussed with resident/NP and other care providers such as pharmacist, RN and RRT.  In addition, I personally evaluated patient and elicited key findings of:  Changes have been there a longtime, unsure if infection present, assess pct to limit abx exposure, CT required, ensure no mass, unlikely for steroids to help, rhonchi on exam rt, some tracheal deviation, will follow with you  Mcarthur Rossetti. Tyson Alias, MD, FACP Pgr: [phone]  Pulmonary & Critical Care 12/02/2014 2:27 PM

## 2014-12-02 NOTE — Progress Notes (Signed)
Patient noted attempting to get OOB while family at bedside visiting. Family unable to redirect patient. Staff unable to redirect patient. Patient noted with watery loose stool in the bed, x3 staff members to assist with cleaning and redirecting. No PRN agitation medication avail within this time frame noted. Message sent to Schorr NP for antidiarrheal and to request resuming klonopin 0.5mg  TID from home meds.

## 2014-12-02 NOTE — Progress Notes (Signed)
To CT scan

## 2014-12-02 NOTE — Progress Notes (Signed)
[place] TEAM 1 - Stepdown/ICU TEAM Progress Note  [name] WRU:[id number] DOB: [birth year] DOA: 12/01/2014 PCP: Thora Lance, MD  Admit HPI / Brief Narrative:   HPI/Subjective: 82 yo male with PMH of COPD, bronchiectasis, L lung collapse on home O2, dementia, and anxiety that was brought to ED by family for shortness of breath and altered mental status. In ED, pt with COPD exacerbation; CXR with chronic bronchiectasis, and R lobe opacity ; ABG with evidence of hypercarbic respiratory failure. Patient is admitted to SDU, placed on BiPAP, and started on IV abx. PCCM is consulted for CXR evidence of chronic bronchiectasis. Patient unsure of how much home O2 he uses.  Assessment/Plan:  Acute on chronic Hypoxic Respiratory failure with hypercarbia secondary to COPD exacerbation -Pt on 4L [place] with 96% O2 sats -Placed on IV Solumedrol  q 12 hrs -Continue Duoneb, Pulmicort -Continue supplemental O2 PRN -PC CM consult secondary to chronic collapsed left lung  CAP -CXR with opacity in R lung with possibility of viral/atypical lung infection -Placed on IV Azithromycin and Rocephin (day 2) -Influenza negative -Legionella/ Strep pneumo urine antigen pending -blood/sputum cxs pending -Follow procalcitonin for abx guidance  Bronchiectasis, Chronic -Pt with hx of Lt Lung collapse -CXR with chronic severe left lung disease with bronchiectasis and poor ventilation of lung -PCCM following-CT chest to r/o mass.  -CT chest pending  Altered mental status -Pt alert yet confused-likely worsened by respiratory failure -CT head with no acute abnormalities  Dementia -Alert yet confused -Continue Aricept  Elevated Troponin -Trop elevated, downtrended- likely secondary to demand ischemia -Pt with Hx of CAD, s/p cardiac cath -Continue ASA 81 daily  HTN -Continue Hydrodiuril    Code Status: FULL Family Communication: no family present at time of exam Disposition Plan:  SDU   Consultants: PCCM  Procedure/Significant Events: None   Culture Blood  Sputum  Antibiotics: Iv azithromycin 1/26 >>> IV Rocephin 1/26 >>>  DVT prophylaxis: SQ Heparin   Devices None   LINES / TUBES:  None    Continuous Infusions: . sodium chloride 50 mL/hr at 12/01/14 2139    Objective: VITAL SIGNS: Temp: 97.7 F (36.5 C) (01/26 1235) Temp Source: Oral (01/26 1235) BP: 141/55 mmHg (01/26 1235) Pulse Rate: 65 (01/26 1235) SPO2; FIO2:   Intake/Output Summary (Last 24 hours) at 12/02/14 1558 Last data filed at 12/02/14 0700  Gross per 24 hour  Intake  467.5 ml  Output      0 ml  Net  467.5 ml     Exam: General: Alert, confused [name], in NAD. On O2 [place] Lungs: limited air movement on L lung field with coarse rhonci Cardiovascular: Regular rate and rhythm without murmur gallop or rub normal S1 and S2 Abdomen: Nontender, nondistended, soft, bowel sounds positive, no rebound, no ascites, no appreciable mass Extremities: No significant cyanosis, clubbing, or edema bilateral lower extremities  Data Reviewed: Basic Metabolic Panel:  Recent Labs Lab 12/01/14 2044 12/02/14 0820  NA  --  138  K  --  3.7  CL  --  97  CO2  --  29  GLUCOSE  --  151*  BUN  --  20  CREATININE 0.91 0.84  CALCIUM  --  8.6   Liver Function Tests:  Recent Labs Lab 12/02/14 0820  AST 127*  ALT 30  ALKPHOS 65  BILITOT 0.4  PROT 6.9  ALBUMIN 3.3*   No results for input(s): LIPASE, AMYLASE in the last 168 hours. No results for input(s): AMMONIA in  the last 168 hours. CBC:  Recent Labs Lab 12/01/14 1639 12/02/14 0820  WBC 11.4* 6.5  HGB 12.3* 12.0*  HCT 39.8 37.9*  MCV 84.7 84.6  PLT 185 185   Cardiac Enzymes:  Recent Labs Lab 12/01/14 2044 12/02/14 0323 12/02/14 0820  TROPONINI 0.39* 0.26* 0.19*   BNP (last 3 results) No results for input(s): PROBNP in the last 8760 hours. CBG:  Recent Labs Lab 12/02/14 0637 12/02/14 0809   GLUCAP 166* 120*    Recent Results (from the past 240 hour(s))  MRSA PCR Screening     Status: None   Collection Time: 12/01/14  8:16 PM  Result Value Ref Range Status   MRSA by PCR NEGATIVE NEGATIVE Final    Comment:        The GeneXpert MRSA Assay (FDA approved for NASAL specimens only), is one component of a comprehensive MRSA colonization surveillance program. It is not intended to diagnose MRSA infection nor to guide or monitor treatment for MRSA infections.   Clostridium Difficile by PCR     Status: None   Collection Time: 12/02/14  6:45 AM  Result Value Ref Range Status   C difficile by pcr NEGATIVE NEGATIVE Final     Studies:  Recent x-ray studies have been reviewed in detail by the Attending Physician  Scheduled Meds:  Scheduled Meds: . aspirin  81 mg Oral Daily  . azithromycin  500 mg Intravenous Q24H  . budesonide  0.25 mg Nebulization BID  . cefTRIAXone (ROCEPHIN)  IV  1 g Intravenous Q24H  . docusate sodium  100 mg Oral BID  . donepezil  5 mg Oral QHS  . folic acid  1 mg Oral Daily  . heparin  5,000 Units Subcutaneous 3 times per day  . hydrochlorothiazide  25 mg Oral Daily  . ipratropium-albuterol  3 mL Nebulization BID  . methylPREDNISolone (SOLU-MEDROL) injection  60 mg Intravenous Q12H  . multivitamin with minerals  1 tablet Oral Daily  . QUEtiapine  100 mg Oral QHS  . sodium chloride  3 mL Intravenous Q12H  . thiamine  100 mg Oral Daily    Time spent on care of this patient: 40 mins   Illa LevelOsman, Sahar , [facility]  [facility] Office  [phone] Pager - [phone]  On-Call/Text Page:      [email or web address]      password [email or web address]  If 7PM-7AM, please contact night-coverage [email or web address] Password [email or web address] 12/02/2014, 3:58 PM   LOS: 1 day   Examined Patient and discussed A&P with ANP Sahar and agree with above plan.  I have reviewed the entire database. I have made any necessary editorial changes, and agree with its content. I have reviewed this  patient's available data, including medical history, events of note, physical examination, radiology studies and test results as part of my evaluation Pt with Multiple Complex medical problems> 35 min spent in direct Pt care   Carolyne Littlesurtis Hanan Mcwilliams, MD [facility] [phone] pager

## 2014-12-02 NOTE — Progress Notes (Signed)
BIPAP not needed at this time. Pt on 4L [place] sats 100%. RT will continue to monitor

## 2014-12-02 NOTE — Progress Notes (Signed)
CRITICAL VALUE ALERT  Critical value received:  Abnormal ABG   Date of notification:   12/02/2014  Time of notification:  0030  Critical value read back: yes  Nurse who received alert:   Julious OkaKarimah Abdussalaam RN   MD notified (1st page):  Dr. Welton FlakesKhan  Time of first page:  0040  No new orders received.

## 2014-12-02 NOTE — Progress Notes (Signed)
Was the fall witnessed: No  Patient condition before and after the fall: Prior to fall Confused and after fall confusion, abrasion to right shoulder.   Patient's reaction to the fall: Unaware of fall  Name of the doctor that was notified including date and time: Sherrell PullerKatheryn Schorr NP  Any interventions and vital signs: VSS. SBG normal, individual toilet plan. Bed in lowest position and bed alarm on more sensitive setting.

## 2014-12-02 NOTE — Progress Notes (Signed)
Pt taken off bipap and placed on [place] at 4L of O2 this time. No complications noted.  SPO2 is 100%

## 2014-12-03 DIAGNOSIS — R400 Somnolence: Secondary | ICD-10-CM

## 2014-12-03 DIAGNOSIS — R0600 Dyspnea, unspecified: Secondary | ICD-10-CM

## 2014-12-03 DIAGNOSIS — J449 Chronic obstructive pulmonary disease, unspecified: Secondary | ICD-10-CM

## 2014-12-03 DIAGNOSIS — R4182 Altered mental status, unspecified: Secondary | ICD-10-CM

## 2014-12-03 DIAGNOSIS — J479 Bronchiectasis, uncomplicated: Secondary | ICD-10-CM

## 2014-12-03 LAB — CBC
HCT: 36.5 % — ABNORMAL LOW (ref 39.0–52.0)
HEMOGLOBIN: 11.5 g/dL — AB (ref 13.0–17.0)
MCH: 26.4 pg (ref 26.0–34.0)
MCHC: 31.5 g/dL (ref 30.0–36.0)
MCV: 83.7 fL (ref 78.0–100.0)
Platelets: 207 10*3/uL (ref 150–400)
RBC: 4.36 MIL/uL (ref 4.22–5.81)
RDW: 14.1 % (ref 11.5–15.5)
WBC: 10.7 10*3/uL — ABNORMAL HIGH (ref 4.0–10.5)

## 2014-12-03 LAB — MAGNESIUM: MAGNESIUM: 2.5 mg/dL (ref 1.5–2.5)

## 2014-12-03 LAB — GLUCOSE, CAPILLARY: GLUCOSE-CAPILLARY: 98 mg/dL (ref 70–99)

## 2014-12-03 LAB — BASIC METABOLIC PANEL
Anion gap: 13 (ref 5–15)
BUN: 18 mg/dL (ref 6–23)
CO2: 28 mmol/L (ref 19–32)
Calcium: 8.7 mg/dL (ref 8.4–10.5)
Chloride: 97 mmol/L (ref 96–112)
Creatinine, Ser: 0.71 mg/dL (ref 0.50–1.35)
GFR calc Af Amer: >90 mL/min (ref >90)
GFR calc non Af Amer: 86 mL/min — ABNORMAL LOW (ref >90)
Glucose, Bld: 132 mg/dL — ABNORMAL HIGH (ref 70–99)
Potassium: 3.3 mmol/L — ABNORMAL LOW (ref 3.5–5.1)
SODIUM: 138 mmol/L (ref 135–145)

## 2014-12-03 LAB — LEGIONELLA ANTIGEN, URINE

## 2014-12-03 MED ORDER — POTASSIUM CHLORIDE CRYS ER 20 MEQ PO TBCR
40.0000 meq | EXTENDED_RELEASE_TABLET | Freq: Once | ORAL | Status: AC
  Administered 2014-12-03: 40 meq via ORAL
  Filled 2014-12-03: qty 2

## 2014-12-03 MED ORDER — IPRATROPIUM-ALBUTEROL 0.5-2.5 (3) MG/3ML IN SOLN
3.0000 mL | Freq: Four times a day (QID) | RESPIRATORY_TRACT | Status: DC
  Administered 2014-12-03 – 2014-12-07 (×14): 3 mL via RESPIRATORY_TRACT
  Filled 2014-12-03 (×14): qty 3

## 2014-12-03 MED ORDER — NICOTINE 14 MG/24HR TD PT24
14.0000 mg | MEDICATED_PATCH | Freq: Every day | TRANSDERMAL | Status: DC
  Administered 2014-12-03 – 2014-12-09 (×7): 14 mg via TRANSDERMAL
  Filled 2014-12-03 (×7): qty 1

## 2014-12-03 MED ORDER — LORAZEPAM 2 MG/ML IJ SOLN
1.0000 mg | Freq: Once | INTRAMUSCULAR | Status: AC
  Administered 2014-12-03: 1 mg via INTRAVENOUS

## 2014-12-03 MED ORDER — CLONAZEPAM 0.5 MG PO TABS
0.5000 mg | ORAL_TABLET | Freq: Three times a day (TID) | ORAL | Status: DC | PRN
  Administered 2014-12-04 – 2014-12-09 (×4): 0.5 mg via ORAL
  Filled 2014-12-03 (×5): qty 1
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
## 2014-12-03 NOTE — Progress Notes (Signed)
Able to obtain safety sitter.  4 point soft restraints and soft waist belt released at this time.  Patient is stable, calm, and cooperative.  Will continue to monitor.
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
## 2014-12-03 NOTE — Consult Note (Signed)
Name: [name] MRN: [id number] DOB: 22-Nov-1931    ADMISSION DATE:  12/01/2014 CONSULTATION DATE:  12/02/2014  REFERRING MD :  Joseph ArtWoods  CHIEF COMPLAINT: AMS  BRIEF PATIENT DESCRIPTION: 81 year old male [facility] 1/25 with AMS. Also with cough and wheeze of exam. [facility]. CXR shows L lung collapse.  SIGNIFICANT EVENTS    STUDIES:  1/25 CT head > negative 1/25 CXR > bronchiectasis with L lung collapse 1/26 CT chest- progressive bronchiectasis and atx left lung  SUBJECTIVE: no distress  VITAL SIGNS: Temp:  [97 F (36.1 C)-98.3 F (36.8 C)] 97.7 F (36.5 C) (01/27 1142) Pulse Rate:  [60-106] 71 (01/27 1142) Resp:  [16-21] 16 (01/27 1142) BP: (124-150)/(63-76) 145/67 mmHg (01/27 1142) SpO2:  [96 %-100 %] 100 % (01/27 1142) Weight:  [71.2 kg (156 lb 15.5 oz)] 71.2 kg (156 lb 15.5 oz) (01/27 0047)  PHYSICAL EXAMINATION: General:  Elderly male in NAD Neuro:  Awake, nonfocal, poor historian HEENT:  [place]/AT, PERRL, no JVD Cardiovascular:  RRR, no MRG Lungs:  L lung with coarse rhonci no changes Abdomen:  Soft, non-tender, non-distended Musculoskeletal:  No acute deformity or ROM limitation Skin:  Grossly intact   Recent Labs Lab 12/01/14 2044 12/02/14 0820 12/03/14 0250  NA  --  138 138  K  --  3.7 3.3*  CL  --  97 97  CO2  --  29 28  BUN  --  20 18  CREATININE 0.91 0.84 0.71  GLUCOSE  --  151* 132*    Recent Labs Lab 12/01/14 1639 12/02/14 0820 12/03/14 0250  HGB 12.3* 12.0* 11.5*  HCT 39.8 37.9* 36.5*  WBC 11.4* 6.5 10.7*  PLT 185 185 207   Dg Chest 2 View (if Patient Has Fever And/or Copd)  12/01/2014   CLINICAL DATA:  81 year old male with respiratory distress for 1 day. Initial encounter. Current history of chronic left lung bronchiectasis and collapse.  EXAM: CHEST  2 VIEW  COMPARISON:  Chest radiograph 05/14/2013 and earlier, including chest CT 09/28/2011.  FINDINGS: Chronic left lung volume loss with leftward shift  of the mediastinum and extension of aerated right lung to the left of midline again noted. Since 2014, the relatively poor ventilation in the left lung has not significantly changed. No superimposed pneumothorax or pulmonary edema identified. No right pleural effusion. However, there is suggestion of subtle increased reticular nodular density in the right lung compared to prior studies. The tracheal air column appears stable. Cardiac and mediastinal contours are largely obscured as before. No acute osseous abnormality identified.  IMPRESSION: Chronic severe left lung disease with bronchiectasis and poor ventilation of that lung, not significantly changed since 2014. Suggestion of superimposed reticulonodular opacity in the right lung such that acute viral/atypical right lung infection is possible.   Electronically Signed   By: Augusto GambleLee  Hall M.D.   On: 12/01/2014 17:48   Ct Head Wo Contrast  12/01/2014   CLINICAL DATA:  Altered mental status.  Initial encounter.  EXAM: CT HEAD WITHOUT CONTRAST  TECHNIQUE: Contiguous axial images were obtained from the base of the skull through the vertex without intravenous contrast.  COMPARISON:  11/02/2011.  FINDINGS: The study was repeated 3 times due to motion during the scan. The study is diagnostic. No mass lesion, mass effect, midline shift, hydrocephalus, hemorrhage. No acute territorial cortical ischemia/infarct. Atrophy and chronic ischemic white matter disease is present. The calvarium appears intact. Grossly, the visible paranasal sinuses appear  within normal limits. Dense intracranial atherosclerosis is present.  IMPRESSION: 1. Mildly motion degraded study. 2. Atrophy and chronic ischemic white matter disease without acute intracranial abnormality.   Electronically Signed   By: Andreas Newport M.D.   On: 12/01/2014 19:41   Ct Chest Wo Contrast  12/02/2014   CLINICAL DATA:  81 year old male with abnormal chest x-ray.  EXAM: CT CHEST WITHOUT CONTRAST  TECHNIQUE:  Multidetector CT imaging of the chest was performed following the standard protocol without IV contrast.  COMPARISON:  Chest x-ray 12/01/2014.  Chest CT 09/28/2011.  FINDINGS: Mediastinum/Lymph Nodes: Shift of cardiomediastinal structures into the left hemithorax secondary to severe chronic left-sided volume loss. Heart size is normal. There is no significant pericardial fluid, thickening or pericardial calcification. There is atherosclerosis of the thoracic aorta, the great vessels of the mediastinum and the coronary arteries, including calcified atherosclerotic plaque in the left main, left anterior descending, left circumflex and right coronary arteries. Dilatation of the pulmonic trunk (3.6 cm in diameter), suggestive of pulmonary arterial hypertension. Severe calcifications of the aortic valve. Multiple borderline enlarged and mildly enlarged mediastinal lymph nodes, measuring up to 11 mm in short axis in the low left paratracheal station. Esophagus is unremarkable in appearance.  Lungs/Pleura: Bronchiectatic changes in the left lung noted on the prior study have significantly increased, now with widespread varicose and cystic bronchiectasis throughout the entire left lung, which appears nearly completely chronically collapsed and consolidated. This is associated with extensive left-sided volume loss. To a much lesser extent there is diffuse bronchial wall thickening and mild cylindrical bronchiectasis throughout the right lung, with some areas of mild varicose bronchiectasis in the right lower lobe. This appears to be associated with significant air trapping, particularly in the right lower lobe. Extensive peribronchovascular micronodularity is noted throughout the right lung, presumably secondary to areas of mucoid impaction within terminal bronchioles. No confluent consolidative airspace disease in the right lung. No pleural effusions.  Musculoskeletal/Soft Tissues: There are no aggressive appearing lytic or  blastic lesions noted in the visualized portions of the skeleton.  Upper Abdomen: Extensive atherosclerosis. Multiple calcified granulomas in the liver and spleen.  IMPRESSION: 1. Compared to the prior study there has been progressively worsened widespread bronchiectasis in the lungs bilaterally. This is most severe on the left side where there is extensive varicose and cystic bronchiectasis with complete chronic collapse and consolidation of the left lung. Findings are presumably related to a chronic infection such as MAI (mycobacterium avium intracellulare) or other atypical organism. 2. Dilatation of the pulmonic trunk, suggesting associated pulmonary arterial hypertension. 3. Atherosclerosis, including left main and 3 vessel coronary artery disease. 4. There are calcifications of the aortic valve. Echocardiographic correlation for evaluation of potential valvular dysfunction may be warranted if clinically indicated.   Electronically Signed   By: Trudie Reed M.D.   On: 12/02/2014 19:48    ASSESSMENT / PLAN:  Acute on chronic hypercarbic respiratory failure (on home O2) Cylindrical bronchiectasis progressive (may have been MAI in 2009 as thid has progressed) L lung collapse / fibrosis, bronchiectasis COPD +/- acute exacerbation -Supplemental O2 to maintain SpO2 > 90% -PRN BiPAP -should address code status and consider DNR DNI = poor prgnosis -CT chest reviewed, no treatment for MAI or bronch recommended as left lung too late, damage extensive and chronic -Continue ABX for now (ceftriaxone, azithro)- have called ID as he is SO high risk for super reoccurrent infections - question is does he need suppressive abx long term given his risk -Nebs -  Systemic steroids, rapid wean to off, no benefit -PCT reassuring and some improved clinical status -consider transfer to floor  I do not see MAi on bronch last done by Dr Mylo Red. Tyson Alias, MD, FACP Pgr: [phone] [facility] 12/03/2014 1:52 PM

## 2014-12-03 NOTE — Progress Notes (Signed)
[facility] TEAM 1 - Stepdown/ICU TEAM Progress Note  [name] NWG:[id number] DOB: 07/21/1932 DOA: 12/01/2014 PCP: Thora Lance, MD  Admit HPI / Brief Narrative: 81 yo male with PMH of COPD (on home O2 - pt unsure how much), bronchiectasis, prior L lung collapse, dementia, and anxiety who was brought to the ED by family for shortness of breath and altered mental status. In ED, pt was felt be experiencing a COPD exacerbation; CXR noted chronic bronchiectasis and R lobe opacity ; ABG with evidence of hypercarbic respiratory failure. Patient was admitted to SDU, placed on BiPAP, and started on IV abx. PCCM was consulted.   HPI/Subjective: Pt with episode of increased agitation overnight, requiring restraints. Restraints removed, sitter at bedside.    Assessment/Plan:  Acute on chronic Hypoxic Respiratory failure with hypercarbia secondary to COPD exacerbation and chronic L lung collapse - chronic bronchiectasis  -Pt on 4L [place] with 100% O2 sats -Tansitioned to oral prednisone from IV solumedrol w/ intent to rapidly taper  -Continue Duoneb, Pulmicort -PCCM consulted for collapsed left lung- no tx for MAI or bronchiectasis recommended as there is extensive chronic damage to left lung. CCM consulted ID for question of possible need for long term suppressive abx with risk of recurrent infection -Continue supplemental O2 PRN, BiPAP PRN  CAP -CXR with opacity in R lung with possibility of viral/atypical lung infection -Placed on IV Azithromycin and Rocephin -Influenza negative; Legionella/ Strep pneumo urine antigen negative -blood/sputum cxs no growth to date -procalcitonin suggests no evidence of active pulm infxn - will cont abx at suggestion of PCCM for now and await ID opinion   Altered mental status - acute encephalopathy  -Pt alert yet confused-likely worsened by respiratory failure - has required restraints and sitter to maintain his safety -CT head with no acute  abnormalities  Dementia -Continue Aricept  Mildly elevated Troponin -Trop elevated, downtrended - likely secondary to demand ischemia - would not be a candidate for interventional tx nonetheless due to end-stage lung process -Pt with Hx of CAD, s/p cardiac cath -Continue ASA 81 daily  HTN -Continue Hydrodiuril  Pulmonary arterial hypertension -CT chest 12/02/13- dilated pulmonic trunk -FU with PCP  Goals of Care Spoke with Mrs. Luan Pulling regarding [name] progressive lung disease/ collapsed L lung  and informed her of chances of recurrent infections, and thus poor prognosis.  She stated that in the past, [name] has expressed his wishes about being a DNR status. Will further revisit this with [name] and family, and attempt to transition to DNR status if family agress.  Code Status: FULL Family Communication: PA spoke with Mrs. Fulgham 1/27 - no family present at time of MD visit  Disposition Plan: Transfer to floor once calm - presently too agitated to be cared for safely outside of SDU   Consultants: PCCM  Antibiotics: azithromycin 1/25 > Rocephin 1/25 >  DVT prophylaxis: SQ Heparin  Objective: Blood pressure 145/67, pulse 71, temperature 97.7 F (36.5 C), temperature source Axillary, resp. rate 16, height  (1.803 m), weight 71.2 kg (156 lb 15.5 oz), SpO2 100 %.  Intake/Output Summary (Last 24 hours) at 12/03/14 1326 Last data filed at 12/03/14 1100  Gross per 24 hour  Intake   1596 ml  Output    650 ml  Net    946 ml   Exam: General: Alert, confused male, in NAD. On O2 [place] Lungs: limited air movement on L lung field with coarse rhonci th/o R  Cardiovascular: Regular rate and  rhythm without murmur gallop or rub normal S1 and S2 Abdomen: Nontender, nondistended, soft, bowel sounds positive, no rebound, no ascites, no appreciable mass Extremities: No significant cyanosis, clubbing, or edema bilateral lower extremities  Data Reviewed: Basic Metabolic  Panel:  Recent Labs Lab 12/01/14 2044 12/02/14 0820 12/03/14 0250  NA  --  138 138  K  --  3.7 3.3*  CL  --  97 97  CO2  --  29 28  GLUCOSE  --  151* 132*  BUN  --  20 18  CREATININE 0.91 0.84 0.71  CALCIUM  --  8.6 8.7  MG  --   --  2.5   Liver Function Tests:  Recent Labs Lab 12/02/14 0820  AST 127*  ALT 30  ALKPHOS 65  BILITOT 0.4  PROT 6.9  ALBUMIN 3.3*   CBC:  Recent Labs Lab 12/01/14 1639 12/02/14 0820 12/03/14 0250  WBC 11.4* 6.5 10.7*  HGB 12.3* 12.0* 11.5*  HCT 39.8 37.9* 36.5*  MCV 84.7 84.6 83.7  PLT 185 185 207   Cardiac Enzymes:  Recent Labs Lab 12/01/14 2044 12/02/14 0323 12/02/14 0820  TROPONINI 0.39* 0.26* 0.19*   CBG:  Recent Labs Lab 12/02/14 0637 12/02/14 0809 12/02/14 1610 12/03/14 0814  GLUCAP 166* 120* 130* 98    Recent Results (from the past 240 hour(s))  Blood culture (routine x 2)     Status: None (Preliminary result)   Collection Time: 12/01/14  6:00 PM  Result Value Ref Range Status   Specimen Description BLOOD ARM LEFT  Final   Special Requests BOTTLES DRAWN AEROBIC AND ANAEROBIC 10CC  Final   Culture   Final           BLOOD CULTURE RECEIVED NO GROWTH TO DATE CULTURE WILL BE HELD FOR 5 DAYS BEFORE ISSUING A FINAL NEGATIVE REPORT Performed at [facility]    Report Status PENDING  Incomplete  Blood culture (routine x 2)     Status: None (Preliminary result)   Collection Time: 12/01/14  6:15 PM  Result Value Ref Range Status   Specimen Description BLOOD HAND LEFT  Final   Special Requests BOTTLES DRAWN AEROBIC AND ANAEROBIC 10CC  Final   Culture   Final           BLOOD CULTURE RECEIVED NO GROWTH TO DATE CULTURE WILL BE HELD FOR 5 DAYS BEFORE ISSUING A FINAL NEGATIVE REPORT Performed at [facility]    Report Status PENDING  Incomplete  MRSA PCR Screening     Status: None   Collection Time: 12/01/14  8:16 PM  Result Value Ref Range Status   MRSA by PCR NEGATIVE NEGATIVE Final    Comment:         The GeneXpert MRSA Assay (FDA approved for NASAL specimens only), is one component of a comprehensive MRSA colonization surveillance program. It is not intended to diagnose MRSA infection nor to guide or monitor treatment for MRSA infections.   Clostridium Difficile by PCR     Status: None   Collection Time: 12/02/14  6:45 AM  Result Value Ref Range Status   C difficile by pcr NEGATIVE NEGATIVE Final     Studies:  Recent x-ray studies have been reviewed in detail by the Attending Physician  Scheduled Meds:  Scheduled Meds: . aspirin  81 mg Oral Daily  . azithromycin  500 mg Intravenous Q24H  . budesonide  0.25 mg Nebulization BID  . cefTRIAXone (ROCEPHIN)  IV  1 g Intravenous  Q24H  . docusate sodium  100 mg Oral BID  . donepezil  5 mg Oral QHS  . folic acid  1 mg Oral Daily  . heparin  5,000 Units Subcutaneous 3 times per day  . hydrochlorothiazide  25 mg Oral Daily  . ipratropium-albuterol  3 mL Nebulization BID  . methylPREDNISolone (SOLU-MEDROL) injection  60 mg Intravenous Q12H  . multivitamin with minerals  1 tablet Oral Daily  . QUEtiapine  100 mg Oral QHS  . sodium chloride  3 mL Intravenous Q12H  . thiamine  100 mg Oral Daily    Time spent on care of this patient: 35 mins   Illa LevelOsman, Sahar , [facility]  [facility] Office  [phone] Pager - [phone]  On-Call/Text Page:      [email or web address]      password [email or web address]  If 7PM-7AM, please contact night-coverage [email or web address] Password [email or web address] 12/03/2014, 1:26 PM   LOS: 2 days   I have personally examined this patient and reviewed the entire database. I have reviewed the above note, made any necessary editorial changes, and agree with its content.  Lonia BloodJeffrey T. McClung, MD [facility]

## 2014-12-04 DIAGNOSIS — I270 Primary pulmonary hypertension: Secondary | ICD-10-CM

## 2014-12-04 LAB — CBC
HEMATOCRIT: 34.3 % — AB (ref 39.0–52.0)
Hemoglobin: 10.6 g/dL — ABNORMAL LOW (ref 13.0–17.0)
MCH: 26.8 pg (ref 26.0–34.0)
MCHC: 30.9 g/dL (ref 30.0–36.0)
MCV: 86.6 fL (ref 78.0–100.0)
Platelets: 210 10*3/uL (ref 150–400)
RBC: 3.96 MIL/uL — AB (ref 4.22–5.81)
RDW: 14.2 % (ref 11.5–15.5)
WBC: 9.9 10*3/uL (ref 4.0–10.5)

## 2014-12-04 LAB — GLUCOSE, CAPILLARY
Glucose-Capillary: 101 mg/dL — ABNORMAL HIGH (ref 70–99)
Glucose-Capillary: 99 mg/dL (ref 70–99)

## 2014-12-04 LAB — BASIC METABOLIC PANEL
Anion gap: 5 (ref 5–15)
BUN: 16 mg/dL (ref 6–23)
CHLORIDE: 98 mmol/L (ref 96–112)
CO2: 36 mmol/L — AB (ref 19–32)
Calcium: 8.3 mg/dL — ABNORMAL LOW (ref 8.4–10.5)
Creatinine, Ser: 0.64 mg/dL (ref 0.50–1.35)
GFR calc Af Amer: >90 mL/min (ref >90)
GFR calc non Af Amer: >90 mL/min (ref >90)
Glucose, Bld: 120 mg/dL — ABNORMAL HIGH (ref 70–99)
Potassium: 3.9 mmol/L (ref 3.5–5.1)
SODIUM: 139 mmol/L (ref 135–145)

## 2014-12-04 LAB — PROCALCITONIN

## 2014-12-04 NOTE — Progress Notes (Addendum)
[name] TEAM 1 - Stepdown/ICU TEAM Progress Note  [name] TLX:[id number] DOB: [birth year] DOA: 12/01/2014 PCP: Simona Huh, MD  Admit HPI / Brief Narrative: 81 yo WM PMHx COPD (on home O2 - pt unsure how much), bronchiectasis, prior L lung collapse, dementia, and anxiety who was brought to the ED by family for shortness of breath and altered mental status. In ED, pt was felt be experiencing a COPD exacerbation; CXR noted chronic bronchiectasis and R lobe opacity;  ABG with evidence of hypercarbic respiratory failure. Patient was admitted to SDU, placed on BiPAP, and started on IV abx. Pt has been transitioned to [place].PCCM and ID have been consulted for collapsed left lung, with no further recommendations due to extensive chronic lung disease.  HPI/Subjective: Alert, confused. On 2L [place]  Assessment/Plan:  Acute on chronic Hypoxic Respiratory failure with hypercarbia secondary to COPD exacerbation and chronic L lung collapse - chronic bronchiectasis  -Pt on 2L [place] with 100% O2 sats -Tapered off steroids -Continue Duoneb, Pulmicort -PCCM consulted for collapsed left lung- no further tx- left lung with extensive chronic damage.  -ID- NO recs for chronic abx suppression - [employer] sputum cx  pending -Continue supplemental O2 PRN, BiPAP PRN   CAP -CXR with opacity in R lung with possibility of viral/atypical lung infection -Placed on IV Azithromycin and Rocephin -Influenza negative; Legionella/ Strep pneumo urine antigen negative -blood/sputum cxs no growth to date -procalcitonin with no evidence of active pulm infection   Altered mental status - acute encephalopathy  -Pt alert yet confused-likely worsened by respiratory failure - Pt with safety mittens and sitter at bedside -CT head with no acute abnormalities  Dementia -Continue Aricept -placed psychiatry consult for help with optimizing medication for his dementia.  Mildly elevated Troponin -Trop elevated,  downtrended - likely secondary to demand ischemia. -Not candidate for any intervention due to end stage lung dz -Pt with Hx of CAD, s/p cardiac cath -Continue ASA 81 daily  HTN -Continue Hydrodiuril  Pulmonary arterial hypertension -CT chest 12/02/13- dilated pulmonic trunk -FU with PCP  Goals of Care 12/04/13-Met with wife and daughter and discussed goals of care and Mr. Speros prognosis. Family oriented about medical status, and are considering DNR. Per wife, she is healthcare POA.   Code Status: FULL Family Communication: PA met with wife and daughter 1/28 Disposition Plan: Transfer to floor.   Consultants: ID-[facility] PCCM- Titus Mould  Procedure/Significant Events: None  Culture Blood-no growth to date Urine- no growth Sputum-pending  Antibiotics: IV Azithromycin 1/25>> IV Rocephin 1/25 >>  DVT prophylaxis: SQ Heparin   Devices None   LINES / TUBES:  None    Continuous Infusions: . sodium chloride 50 mL/hr at 12/03/14 0800    Objective: VITAL SIGNS: Temp: 98.3 F (36.8 C) (01/28 1602) Temp Source: Oral (01/28 1602) BP: 134/66 mmHg (01/28 1602) Pulse Rate: 58 (01/28 1602) SPO2; FIO2:   Intake/Output Summary (Last 24 hours) at 12/04/14 1701 Last data filed at 12/04/14 0900  Gross per 24 hour  Intake    240 ml  Output    500 ml  Net   -260 ml     Exam: General: Alert confused [name] in NAD. On [place] O2 Lungs: limited air movement on L lung with coarse rhonchi Cardiovascular: Regular rate and rhythm without murmur gallop or rub normal S1 and S2 Abdomen: Nontender, nondistended, soft, bowel sounds positive, no rebound, no ascites, no appreciable mass Extremities: No significant cyanosis, clubbing, or edema bilateral lower extremities  Data Reviewed:  Basic Metabolic Panel:  Recent Labs Lab 12/01/14 2044 12/02/14 0820 12/03/14 0250 12/04/14 0522  NA  --  138 138 139  K  --  3.7 3.3* 3.9  CL  --  97 97 98  CO2  --  29 28 36*    GLUCOSE  --  151* 132* 120*  BUN  --  $R'20 18 16  'sn$ CREATININE 0.91 0.84 0.71 0.64  CALCIUM  --  8.6 8.7 8.3*  MG  --   --  2.5  --    Liver Function Tests:  Recent Labs Lab 12/02/14 0820  AST 127*  ALT 30  ALKPHOS 65  BILITOT 0.4  PROT 6.9  ALBUMIN 3.3*   No results for input(s): LIPASE, AMYLASE in the last 168 hours. No results for input(s): AMMONIA in the last 168 hours. CBC:  Recent Labs Lab 12/01/14 1639 12/02/14 0820 12/03/14 0250 12/04/14 0522  WBC 11.4* 6.5 10.7* 9.9  HGB 12.3* 12.0* 11.5* 10.6*  HCT 39.8 37.9* 36.5* 34.3*  MCV 84.7 84.6 83.7 86.6  PLT 185 185 207 210   Cardiac Enzymes:  Recent Labs Lab 12/01/14 2044 12/02/14 0323 12/02/14 0820  TROPONINI 0.39* 0.26* 0.19*   BNP (last 3 results) No results for input(s): PROBNP in the last 8760 hours. CBG:  Recent Labs Lab 12/02/14 0809 12/02/14 1610 12/03/14 0814 12/04/14 0737 12/04/14 1126  GLUCAP 120* 130* 98 101* 99    Recent Results (from the past 240 hour(s))  Blood culture (routine x 2)     Status: None (Preliminary result)   Collection Time: 12/01/14  6:00 PM  Result Value Ref Range Status   Specimen Description BLOOD ARM LEFT  Final   Special Requests BOTTLES DRAWN AEROBIC AND ANAEROBIC 10CC  Final   Culture   Final           BLOOD CULTURE RECEIVED NO GROWTH TO DATE CULTURE WILL BE HELD FOR 5 DAYS BEFORE ISSUING A FINAL NEGATIVE REPORT Performed at [facility]    Report Status PENDING  Incomplete  Blood culture (routine x 2)     Status: None (Preliminary result)   Collection Time: 12/01/14  6:15 PM  Result Value Ref Range Status   Specimen Description BLOOD HAND LEFT  Final   Special Requests BOTTLES DRAWN AEROBIC AND ANAEROBIC 10CC  Final   Culture   Final           BLOOD CULTURE RECEIVED NO GROWTH TO DATE CULTURE WILL BE HELD FOR 5 DAYS BEFORE ISSUING A FINAL NEGATIVE REPORT Performed at [facility]    Report Status PENDING  Incomplete  MRSA PCR Screening      Status: None   Collection Time: 12/01/14  8:16 PM  Result Value Ref Range Status   MRSA by PCR NEGATIVE NEGATIVE Final    Comment:        The GeneXpert MRSA Assay (FDA approved for NASAL specimens only), is one component of a comprehensive MRSA colonization surveillance program. It is not intended to diagnose MRSA infection nor to guide or monitor treatment for MRSA infections.   Clostridium Difficile by PCR     Status: None   Collection Time: 12/02/14  6:45 AM  Result Value Ref Range Status   C difficile by pcr NEGATIVE NEGATIVE Final     Studies:  Recent x-ray studies have been reviewed in detail by the Attending Physician  Scheduled Meds:  Scheduled Meds: . aspirin  81 mg Oral Daily  . azithromycin  500 mg Intravenous Q24H  . budesonide  0.25 mg Nebulization BID  . cefTRIAXone (ROCEPHIN)  IV  1 g Intravenous Q24H  . docusate sodium  100 mg Oral BID  . donepezil  5 mg Oral QHS  . folic acid  1 mg Oral Daily  . heparin  5,000 Units Subcutaneous 3 times per day  . ipratropium-albuterol  3 mL Nebulization QID  . multivitamin with minerals  1 tablet Oral Daily  . nicotine  14 mg Transdermal Daily  . QUEtiapine  100 mg Oral QHS  . thiamine  100 mg Oral Daily    Time spent on care of this patient: 40 mins   Lacy Duverney , [facility]  [facility] Office  [phone] Pager - [phone]  On-Call/Text Page:      [email or web address]      password [email or web address]  If 7PM-7AM, please contact night-coverage [email or web address] Password [email or web address] 12/04/2014, 5:01 PM   LOS: 3 days     Examined Patient and discussed A&P with PA Sahar and agree with above plan.  I have reviewed the entire database. I have made any necessary editorial changes, and agree with its content. I have reviewed this patient's available data, including medical history, events of note, physical examination, radiology studies and test results as part of my evaluation Pt with Multiple Complex medical problems> 35 min  spent in direct Pt care   Dia Crawford, MD [facility] [phone] pager

## 2014-12-05 DIAGNOSIS — J9622 Acute and chronic respiratory failure with hypercapnia: Secondary | ICD-10-CM | POA: Diagnosis present

## 2014-12-05 DIAGNOSIS — J9600 Acute respiratory failure, unspecified whether with hypoxia or hypercapnia: Secondary | ICD-10-CM

## 2014-12-05 DIAGNOSIS — F0391 Unspecified dementia with behavioral disturbance: Secondary | ICD-10-CM

## 2014-12-05 DIAGNOSIS — G4700 Insomnia, unspecified: Secondary | ICD-10-CM | POA: Diagnosis present

## 2014-12-05 DIAGNOSIS — I272 Pulmonary hypertension, unspecified: Secondary | ICD-10-CM | POA: Diagnosis present

## 2014-12-05 LAB — BASIC METABOLIC PANEL
Anion gap: 1 — ABNORMAL LOW (ref 5–15)
BUN: 16 mg/dL (ref 6–23)
CALCIUM: 8 mg/dL — AB (ref 8.4–10.5)
CO2: 40 mmol/L — AB (ref 19–32)
CREATININE: 0.64 mg/dL (ref 0.50–1.35)
Chloride: 100 mmol/L (ref 96–112)
GFR calc Af Amer: >90 mL/min (ref >90)
GLUCOSE: 91 mg/dL (ref 70–99)
Potassium: 3.4 mmol/L — ABNORMAL LOW (ref 3.5–5.1)
Sodium: 141 mmol/L (ref 135–145)

## 2014-12-05 LAB — CBC
HCT: 32 % — ABNORMAL LOW (ref 39.0–52.0)
Hemoglobin: 9.9 g/dL — ABNORMAL LOW (ref 13.0–17.0)
MCH: 26.5 pg (ref 26.0–34.0)
MCHC: 30.9 g/dL (ref 30.0–36.0)
MCV: 85.6 fL (ref 78.0–100.0)
Platelets: 204 10*3/uL (ref 150–400)
RBC: 3.74 MIL/uL — AB (ref 4.22–5.81)
RDW: 14.4 % (ref 11.5–15.5)
WBC: 6.5 10*3/uL (ref 4.0–10.5)

## 2014-12-05 LAB — BLOOD GAS, ARTERIAL
Acid-Base Excess: 9.2 mmol/L — ABNORMAL HIGH (ref 0.0–2.0)
Bicarbonate: 33.6 mEq/L — ABNORMAL HIGH (ref 20.0–24.0)
Drawn by: 246861
O2 CONTENT: 2 L/min
O2 Saturation: 99.2 %
PO2 ART: 120 mmHg — AB (ref 80.0–100.0)
Patient temperature: 98.6
TCO2: 35.1 mmol/L (ref 0–100)
pCO2 arterial: 49.3 mmHg — ABNORMAL HIGH (ref 35.0–45.0)
pH, Arterial: 7.449 (ref 7.350–7.450)

## 2014-12-05 LAB — GLUCOSE, CAPILLARY: Glucose-Capillary: 78 mg/dL (ref 70–99)

## 2014-12-05 MED ORDER — QUETIAPINE FUMARATE 25 MG PO TABS
25.0000 mg | ORAL_TABLET | Freq: Two times a day (BID) | ORAL | Status: DC
Start: 2014-12-05 — End: 2014-12-09
  Administered 2014-12-05 – 2014-12-08 (×8): 25 mg via ORAL
  Filled 2014-12-05 (×11): qty 1

## 2014-12-05 NOTE — Consult Note (Signed)
   Name: [name] MRN: [id number] DOB: 09-09-1932    ADMISSION DATE:  12/01/2014 CONSULTATION DATE:  12/02/2014  REFERRING MD :  Joseph ArtWoods  CHIEF COMPLAINT: AMS  BRIEF PATIENT DESCRIPTION: 81 year old male [facility] with AMS. Also with cough and wheeze of exam. Admitted to [facility] and placed on BiPAP. CXR shows L lung collapse.  SIGNIFICANT EVENTS    STUDIES:  1/25 CT head > negative 1/25 CXR > bronchiectasis with L lung collapse 1/26 CT chest- progressive bronchiectasis and atx left lung  SUBJECTIVE: no distress, confused  VITAL SIGNS: Temp:  [97.6 F (36.4 C)-98.3 F (36.8 C)] 98.3 F (36.8 C) (01/29 0400) Pulse Rate:  [59-74] 66 (01/29 0400) Resp:  [13-21] 15 (01/29 0400) BP: (107-155)/(29-66) 107/29 mmHg (01/29 0400) SpO2:  [92 %-100 %] 100 % (01/29 1545) FiO2 (%):  [28 %] 28 % (01/29 1545)  PHYSICAL EXAMINATION: General:  Elderly male in NAD Neuro:  Awake, nonfocal, smiled when I woke him up HEENT:  [place]/AT, PERRL, wnl JVD Cardiovascular:  RRR, no MRG Lungs: les ronchi left Abdomen:  Soft, non-tender, non-distended Musculoskeletal:  No acute deformity or ROM limitation Skin:  Grossly intact   Recent Labs Lab 12/03/14 0250 12/04/14 0522 12/05/14 0422  NA 138 139 141  K 3.3* 3.9 3.4*  CL 97 98 100  CO2 28 36* 40*  BUN 18 16 16   CREATININE 0.71 0.64 0.64  GLUCOSE 132* 120* 91    Recent Labs Lab 12/03/14 0250 12/04/14 0522 12/05/14 0422  HGB 11.5* 10.6* 9.9*  HCT 36.5* 34.3* 32.0*  WBC 10.7* 9.9 6.5  PLT 207 210 204   No results found.  ASSESSMENT / PLAN:  Acute on chronic hypercarbic respiratory failure (on home O2) Cylindrical bronchiectasis progressive (may have been MAI in 2009 as thid has progressed) L lung collapse / fibrosis, bronchiectasis COPD +/- acute exacerbation -DNR noted, appropriate, would MOVE him to floor -ID note and recs appreciated, no role suppressive therapy -reasonable to treat PNA, ID has spelled out  durations -his lung fxn left will not recover and prognosis is awful -can follow up with Dr Delford FieldWright [phone] post DC from hospital -consider hospice evaluation -no role pcxr follow up for resolution PNA, with such chronic changes -if distress, comfort care, BIPAP would not be effective  Will sign off, call if needed Follow up Dr Mylo RedWright  Daniel J. Tyson AliasFeinstein, MD, FACP Pgr: [phone] [facility] 12/05/2014 5:08 PM

## 2014-12-05 NOTE — Progress Notes (Addendum)
CRITICAL VALUE ALERT  Critical value received:  CO2 40  Date of notification:  12/05/2014  Time of notification:  0600  Critical value read back: yes  Nurse who received alert:  Tonette LedererGrace Halana Deisher, RN  MD notified (1st page): Kirtland BouchardK. Schorr  Time of first page:  [phone]  Responding MD:    Time MD responded:

## 2014-12-05 NOTE — Progress Notes (Signed)
Spoke with Dr Ivar Drape Snider, Infectious Disease, and verified that patient is not being ruled out for TB and does not require airborne isolation.  Diannia RuderKara, RN notified.
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
## 2014-12-05 NOTE — Progress Notes (Signed)
TEAM 1 - Stepdown/ICU TEAM Progress Note  [name] ZOX:[id number] DOB: 08/04/1932 DOA: 12/01/2014 PCP: Thora Lance, MD  Admit HPI / Brief Narrative: 81 yo WM PMHx COPD (on home O2 - pt unsure how much), bronchiectasis, prior L lung collapse, dementia, and anxiety who was brought to the ED by family for shortness of breath and altered mental status. In ED, pt was felt be experiencing a COPD exacerbation; CXR noted chronic bronchiectasis and R lobe opacity; ABG with evidence of hypercarbic respiratory failure. Patient was admitted to SDU, placed on BiPAP, and started on IV abx. Pt has been transitioned to [place].PCCM and ID have been consulted for collapsed left lung, with no further recommendations due to extensive chronic lung disease.  HPI/Subjective: 1/29  A/O 1 (does not know where, when, why), however does recognize his daughter and granddaughter  Assessment/Plan: Acute on chronic Hypoxic Respiratory failure with hypercarbia secondary to COPD exacerbation and chronic L lung collapse - chronic bronchiectasis  -Pt on 2L [place] with 100% O2 sats -Tapered off steroids -Continue Duoneb, Pulmicort -Flutter valve -PCCM consulted for collapsed left lung- no further tx- left lung with extensive chronic damage.  -ID- NO recs for chronic abx suppression - [employer] sputum cx pending -Continue supplemental O2 PRN, BiPAP PRN  CAP -CXR with opacity in R lung with possibility of viral/atypical lung infection -Continue  IV Azithromycin and Rocephin for full ten-day course of antibiotic -Influenza negative; Legionella/ Strep pneumo urine antigen negative -blood/sputum cxs no growth to date -ABG pending  Altered mental status - acute encephalopathy  -Pt alert yet confused-likely worsened by respiratory failure, Pt with safety mittens and sitter at bedside -CT head with no acute abnormalities  Dementia -Continue Aricept -PT/OT consult pending  Mildly elevated  Troponin -Trop elevated, downtrended - likely secondary to demand ischemia. -Not candidate for any intervention due to end stage lung dz -Pt with Hx of CAD, s/p cardiac cath -Continue ASA 81 daily  HTN -Hold all BP medication; patient hypotensive for a 81 yo   Pulmonary arterial hypertension -CT chest 12/02/13- dilated pulmonic trunk -FU with PCP    Code Status: FULL Family Communication: no family present at time of exam Disposition Plan: SNF    Consultants: ID-Dr. Judyann Munson PCCM-Dr. Rory Percy  Procedure/Significant Events: 1/25 CT head without contrast;Atrophy and chronic ischemic white matter disease without acute intracranial abnormality. 1/26 CT chest without contrast;-progressively worsened widespread bronchiectasis in the lungs bilaterally. Most severe on the left side where there is extensive varicose and cystic bronchiectasis with complete chronic collapse and consolidation of the left lung. F -Findings  presumably related to a chronic infection such as MAI (mycobacterium avium intracellulare) or other atypical organism.    Culture 1/26 C. difficile by PCR negative Blood-no growth to date Urine- no growth Sputum-pending  Antibiotics: IV Azithromycin 1/25>> IV Rocephin 1/25 >>  DVT prophylaxis: SQ Heparin   Devices NA   LINES / TUBES:      Continuous Infusions: . sodium chloride 50 mL/hr at 12/03/14 0800    Objective: VITAL SIGNS: Temp: 98.3 F (36.8 C) (01/29 0400) Temp Source: Axillary (01/29 0400) BP: 107/29 mmHg (01/29 0400) Pulse Rate: 66 (01/29 0400) SPO2; FIO2:   Intake/Output Summary (Last 24 hours) at 12/05/14 1000 Last data filed at 12/05/14 0909  Gross per 24 hour  Intake    900 ml  Output    450 ml  Net    450 ml     Exam: General: A/O 1 (does  not know where, when, why), however does recognize his daughter and granddaughter. NAD, No acute respiratory distress Lungs: left lung diffusely rhonchorous with  decreased air movement, right lung fields decreased air movement with expiratory wheezing, negative crackles Cardiovascular: Regular rate and rhythm without murmur gallop or rub normal S1 and S2 Abdomen: Nontender, nondistended, soft, bowel sounds positive, no rebound, no ascites, no appreciable mass Extremities: No significant cyanosis, clubbing, or edema bilateral lower extremities  Data Reviewed: Basic Metabolic Panel:  Recent Labs Lab 12/01/14 2044 12/02/14 0820 12/03/14 0250 12/04/14 0522 12/05/14 0422  NA  --  138 138 139 141  K  --  3.7 3.3* 3.9 3.4*  CL  --  97 97 98 100  CO2  --  29 28 36* 40*  GLUCOSE  --  151* 132* 120* 91  BUN  --  CREATININE 0.91 0.84 0.71 0.64 0.64  CALCIUM  --  8.6 8.7 8.3* 8.0*  MG  --   --  2.5  --   --    Liver Function Tests:  Recent Labs Lab 12/02/14 0820  AST 127*  ALT 30  ALKPHOS 65  BILITOT 0.4  PROT 6.9  ALBUMIN 3.3*   No results for input(s): LIPASE, AMYLASE in the last 168 hours. No results for input(s): AMMONIA in the last 168 hours. CBC:  Recent Labs Lab 12/01/14 1639 12/02/14 0820 12/03/14 0250 12/04/14 0522 12/05/14 0422  WBC 11.4* 6.5 10.7* 9.9 6.5  HGB 12.3* 12.0* 11.5* 10.6* 9.9*  HCT 39.8 37.9* 36.5* 34.3* 32.0*  MCV 84.7 84.6 83.7 86.6 85.6  PLT 185 185 207 210 204   Cardiac Enzymes:  Recent Labs Lab 12/01/14 2044 12/02/14 0323 12/02/14 0820  TROPONINI 0.39* 0.26* 0.19*   BNP (last 3 results) No results for input(s): PROBNP in the last 8760 hours. CBG:  Recent Labs Lab 12/02/14 1610 12/03/14 0814 12/04/14 0737 12/04/14 1126 12/05/14 0823  GLUCAP 130* 98 101* 99 78    Recent Results (from the past 240 hour(s))  Blood culture (routine x 2)     Status: None (Preliminary result)   Collection Time: 12/01/14  6:00 PM  Result Value Ref Range Status   Specimen Description BLOOD ARM LEFT  Final   Special Requests BOTTLES DRAWN AEROBIC AND ANAEROBIC 10CC  Final   Culture   Final            BLOOD CULTURE RECEIVED NO GROWTH TO DATE CULTURE WILL BE HELD FOR 5 DAYS BEFORE ISSUING A FINAL NEGATIVE REPORT Performed at [facility]    Report Status PENDING  Incomplete  Blood culture (routine x 2)     Status: None (Preliminary result)   Collection Time: 12/01/14  6:15 PM  Result Value Ref Range Status   Specimen Description BLOOD HAND LEFT  Final   Special Requests BOTTLES DRAWN AEROBIC AND ANAEROBIC 10CC  Final   Culture   Final           BLOOD CULTURE RECEIVED NO GROWTH TO DATE CULTURE WILL BE HELD FOR 5 DAYS BEFORE ISSUING A FINAL NEGATIVE REPORT Performed at [facility]    Report Status PENDING  Incomplete  MRSA PCR Screening     Status: None   Collection Time: 12/01/14  8:16 PM  Result Value Ref Range Status   MRSA by PCR NEGATIVE NEGATIVE Final    Comment:        The GeneXpert MRSA Assay (FDA approved for NASAL specimens only),  is one component of a comprehensive MRSA colonization surveillance program. It is not intended to diagnose MRSA infection nor to guide or monitor treatment for MRSA infections.   Clostridium Difficile by PCR     Status: None   Collection Time: 12/02/14  6:45 AM  Result Value Ref Range Status   C difficile by pcr NEGATIVE NEGATIVE Final     Studies:  Recent x-ray studies have been reviewed in detail by the Attending Physician  Scheduled Meds:  Scheduled Meds: . aspirin  81 mg Oral Daily  . azithromycin  500 mg Intravenous Q24H  . budesonide  0.25 mg Nebulization BID  . cefTRIAXone (ROCEPHIN)  IV  1 g Intravenous Q24H  . docusate sodium  100 mg Oral BID  . donepezil  5 mg Oral QHS  . folic acid  1 mg Oral Daily  . heparin  5,000 Units Subcutaneous 3 times per day  . ipratropium-albuterol  3 mL Nebulization QID  . multivitamin with minerals  1 tablet Oral Daily  . nicotine  14 mg Transdermal Daily  . QUEtiapine  100 mg Oral QHS  . thiamine  100 mg Oral Daily    Time spent on care of this patient: 40  mins   Drema DallasWOODS, Crystol Walpole, J , [facility]  [facility] Office  [phone] Pager - [phone]  On-Call/Text Page:      [email or web address]      password [email or web address]  If 7PM-7AM, please contact night-coverage [email or web address] Password [email or web address] 12/05/2014, 10:00 AM   LOS: 4 days   Care during the described time interval was provided by me .  I have reviewed this patient's available data, including medical history, events of note, physical examination, radiology studies and test results as part of my evaluation  Carolyne Littlesurtis Dorinda Stehr, MD [phone] Pager
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
## 2014-12-05 NOTE — Progress Notes (Signed)
Patient is in no respiratory distress and BiPAP is not needed at this time. 

## 2014-12-05 NOTE — Clinical Social Work Note (Signed)
CSW met with patients wife and daughter to discuss resources for patient: SNF, ALF, memory care, medicaid etc.  CSW provided patients dtr with resources for the pt- family to initiate medicaid application.  CSW will continue to follow.  Domenica Reamer, [place] Social Worker [phone]

## 2014-12-06 LAB — PROCALCITONIN

## 2014-12-06 NOTE — Progress Notes (Signed)
BiPAP Vision pulled from room since not used since 12/02/2014, RT to monitor.

## 2014-12-07 LAB — CBC WITH DIFFERENTIAL/PLATELET
Basophils Absolute: 0 10*3/uL (ref 0.0–0.1)
Basophils Relative: 0 % (ref 0–1)
Eosinophils Absolute: 0.4 10*3/uL (ref 0.0–0.7)
Eosinophils Relative: 3 % (ref 0–5)
HEMATOCRIT: 32.2 % — AB (ref 39.0–52.0)
Hemoglobin: 10.4 g/dL — ABNORMAL LOW (ref 13.0–17.0)
LYMPHS PCT: 11 % — AB (ref 12–46)
Lymphs Abs: 1.2 10*3/uL (ref 0.7–4.0)
MCH: 27 pg (ref 26.0–34.0)
MCHC: 32.3 g/dL (ref 30.0–36.0)
MCV: 83.6 fL (ref 78.0–100.0)
Monocytes Absolute: 0.8 10*3/uL (ref 0.1–1.0)
Monocytes Relative: 7 % (ref 3–12)
Neutro Abs: 8.7 10*3/uL — ABNORMAL HIGH (ref 1.7–7.7)
Neutrophils Relative %: 79 % — ABNORMAL HIGH (ref 43–77)
PLATELETS: 194 10*3/uL (ref 150–400)
RBC: 3.85 MIL/uL — ABNORMAL LOW (ref 4.22–5.81)
RDW: 14.5 % (ref 11.5–15.5)
WBC: 11 10*3/uL — AB (ref 4.0–10.5)

## 2014-12-07 LAB — COMPREHENSIVE METABOLIC PANEL
ALT: 31 U/L (ref 0–53)
AST: 90 U/L — ABNORMAL HIGH (ref 0–37)
Albumin: 2.5 g/dL — ABNORMAL LOW (ref 3.5–5.2)
Alkaline Phosphatase: 56 U/L (ref 39–117)
Anion gap: 6 (ref 5–15)
BILIRUBIN TOTAL: 0.5 mg/dL (ref 0.3–1.2)
BUN: 7 mg/dL (ref 6–23)
CO2: 31 mmol/L (ref 19–32)
CREATININE: 0.65 mg/dL (ref 0.50–1.35)
Calcium: 7.9 mg/dL — ABNORMAL LOW (ref 8.4–10.5)
Chloride: 99 mmol/L (ref 96–112)
GFR calc non Af Amer: 89 mL/min — ABNORMAL LOW (ref >90)
Glucose, Bld: 94 mg/dL (ref 70–99)
Potassium: 3.4 mmol/L — ABNORMAL LOW (ref 3.5–5.1)
SODIUM: 136 mmol/L (ref 135–145)
Total Protein: 5.8 g/dL — ABNORMAL LOW (ref 6.0–8.3)

## 2014-12-07 LAB — MAGNESIUM: Magnesium: 2.4 mg/dL (ref 1.5–2.5)

## 2014-12-07 MED ORDER — IPRATROPIUM-ALBUTEROL 0.5-2.5 (3) MG/3ML IN SOLN
3.0000 mL | Freq: Three times a day (TID) | RESPIRATORY_TRACT | Status: DC
  Administered 2014-12-07 – 2014-12-08 (×2): 3 mL via RESPIRATORY_TRACT
  Filled 2014-12-07 (×2): qty 3

## 2014-12-07 NOTE — Progress Notes (Signed)
[facility] TEAM 1 - Stepdown/ICU TEAM Progress Note  [name] ZOX:[id number] DOB: 13-May-1932 DOA: 12/01/2014 PCP: Thora Lance, MD  Admit HPI / Brief Narrative: 81 yo WM PMHx COPD (on home O2 - pt unsure how much), bronchiectasis, prior L lung collapse, dementia, and anxiety who was brought to the ED by family for shortness of breath and altered mental status. In ED, pt was felt be experiencing a COPD exacerbation; CXR noted chronic bronchiectasis and R lobe opacity; ABG with evidence of hypercarbic respiratory failure. Patient was admitted to SDU, placed on BiPAP, and started on IV abx. Pt has been transitioned to [place].PCCM and ID have been consulted for collapsed left lung, with no further recommendations due to extensive chronic lung disease.  HPI/Subjective: 1/31 A/O 1 (does not know where, when, why). Pleasant, follows all commands.  Assessment/Plan: Acute on chronic Hypoxic Respiratory failure with hypercarbia secondary to COPD exacerbation and chronic L lung collapse - chronic bronchiectasis  -Pt on 2L [place] with 100% O2 sats -Tapered off steroids -Continue Duoneb, Pulmicort -Flutter valve -PCCM consulted for collapsed left lung- no further tx- left lung with extensive chronic damage.  -ID- NO recs for chronic abx suppression - [employer] sputum cx pending -Continue supplemental O2 PRN, BiPAP PRN -PT recommends SNF -Occupational therapy pending -CSW aware of patient and her working on SNF placement  CAP -CXR with opacity in R lung with possibility of viral/atypical lung infection -Continue IV Azithromycin and Rocephin for full ten-day course of antibiotic -Influenza negative; Legionella/ Strep pneumo urine antigen negative -blood/sputum cxs no growth to date   Altered mental status - acute encephalopathy  -Was likely multifactorial to include to encephalopathy, CAP, dementia   Pt with safety mittens and sitter at bedside -CT head with no acute  abnormalities  Dementia -Continue Aricept -Continue circumflex well 25 mg BID , and 100 mg QHS   Mildly elevated Troponin -Trop elevated, downtrended - likely secondary to demand ischemia. -Not candidate for any intervention due to end stage lung dz -Pt with Hx of CAD, s/p cardiac cath -Continue ASA 81 daily  HTN -Hold all BP medication; patient hypotensive for a 81 yo   Pulmonary arterial hypertension -CT chest 12/02/13- dilated pulmonic trunk -FU with PCP   Code Status: FULL Family Communication: no family present at time of exam Disposition Plan: SNF    Consultants: ID-Dr. Judyann Munson PCCM-Dr. Rory Percy   Procedure/Significant Events: 1/25 CT head without contrast;Atrophy and chronic ischemic white matter disease without acute intracranial abnormality. 1/26 CT chest without contrast;-progressively worsened widespread bronchiectasis in the lungs bilaterally. Most severe on the left side where there is extensive varicose and cystic bronchiectasis with complete chronic collapse and consolidation of the left lung. F -Findings presumably related to a chronic infection such as MAI (mycobacterium avium intracellulare) or other atypical organism.    Culture 1/26 C. difficile by PCR negative 1/26 Legionella urine AG negative 1/26 strep pneumo urine AG negative 1/26 C. difficile negative Blood-no growth to date Urine- no growth Sputum-pending 1/29 QuantiFERON pending   Antibiotics: IV Azithromycin 1/25>> IV Rocephin 1/25 >>  DVT prophylaxis: SQ Heparin   Devices NA  LINES / TUBES:    Continuous Infusions: . sodium chloride 50 mL/hr at 12/07/14 0400    Objective: VITAL SIGNS: Temp: 98.5 F (36.9 C) (01/31 0801) Temp Source: Oral (01/31 0801) BP: 137/54 mmHg (01/31 0800) Pulse Rate: 65 (01/31 0800) SPO2; FIO2:   Intake/Output Summary (Last 24 hours) at 12/07/14 8119 Last data filed at  12/07/14 0640  Gross per 24 hour   Intake 1295.83 ml  Output    700 ml  Net 595.83 ml     Exam: General: A/O 1 (does not know where, when, why). NAD, No acute respiratory distress Lungs: left lung diffusely rhonchorous with decreased air movement, right lung fields decreased air movement with mild expiratory wheezing, negative crackles Cardiovascular: Regular rate and rhythm without murmur gallop or rub normal S1 and S2 Abdomen: Nontender, nondistended, soft, bowel sounds positive, no rebound, no ascites, no appreciable mass Extremities: No significant cyanosis, clubbing, or edema bilateral lower extremities  Data Reviewed: Basic Metabolic Panel:  Recent Labs Lab 12/02/14 0820 12/03/14 0250 12/04/14 0522 12/05/14 0422 12/07/14 0300  NA 138 138 139 141 136  K 3.7 3.3* 3.9 3.4* 3.4*  CL 97 97 98 100 99  CO2 29 28 36* 40* 31  GLUCOSE 151* 132* 120* 91 94  BUN 20 18 16 16 7   CREATININE 0.84 0.71 0.64 0.64 0.65  CALCIUM 8.6 8.7 8.3* 8.0* 7.9*  MG  --  2.5  --   --  2.4   Liver Function Tests:  Recent Labs Lab 12/02/14 0820 12/07/14 0300  AST 127* 90*  ALT 30 31  ALKPHOS 65 56  BILITOT 0.4 0.5  PROT 6.9 5.8*  ALBUMIN 3.3* 2.5*   No results for input(s): LIPASE, AMYLASE in the last 168 hours. No results for input(s): AMMONIA in the last 168 hours. CBC:  Recent Labs Lab 12/02/14 0820 12/03/14 0250 12/04/14 0522 12/05/14 0422 12/07/14 0300  WBC 6.5 10.7* 9.9 6.5 11.0*  NEUTROABS  --   --   --   --  8.7*  HGB 12.0* 11.5* 10.6* 9.9* 10.4*  HCT 37.9* 36.5* 34.3* 32.0* 32.2*  MCV 84.6 83.7 86.6 85.6 83.6  PLT 185 207 210 204 194   Cardiac Enzymes:  Recent Labs Lab 12/01/14 2044 12/02/14 0323 12/02/14 0820  TROPONINI 0.39* 0.26* 0.19*   BNP (last 3 results) No results for input(s): PROBNP in the last 8760 hours. CBG:  Recent Labs Lab 12/02/14 1610 12/03/14 0814 12/04/14 0737 12/04/14 1126 12/05/14 0823  GLUCAP 130* 98 101* 99 78    Recent Results (from the past 240 hour(s))   Blood culture (routine x 2)     Status: None (Preliminary result)   Collection Time: 12/01/14  6:00 PM  Result Value Ref Range Status   Specimen Description BLOOD ARM LEFT  Final   Special Requests BOTTLES DRAWN AEROBIC AND ANAEROBIC 10CC  Final   Culture   Final           BLOOD CULTURE RECEIVED NO GROWTH TO DATE CULTURE WILL BE HELD FOR 5 DAYS BEFORE ISSUING A FINAL NEGATIVE REPORT [facility]    Report Status PENDING  Incomplete  Blood culture (routine x 2)     Status: None (Preliminary result)   Collection Time: 12/01/14  6:15 PM  Result Value Ref Range Status   Specimen Description BLOOD HAND LEFT  Final   Special Requests BOTTLES DRAWN AEROBIC AND ANAEROBIC 10CC  Final   Culture   Final           BLOOD CULTURE RECEIVED NO GROWTH TO DATE CULTURE WILL BE HELD FOR 5 DAYS BEFORE ISSUING A FINAL NEGATIVE REPORT Performed at [facility]    Report Status PENDING  Incomplete  MRSA PCR Screening     Status: None   Collection Time: 12/01/14  8:16 PM  Result Value  Ref Range Status   MRSA by PCR NEGATIVE NEGATIVE Final    Comment:        The GeneXpert MRSA Assay (FDA approved for NASAL specimens only), is one component of a comprehensive MRSA colonization surveillance program. It is not intended to diagnose MRSA infection nor to guide or monitor treatment for MRSA infections.   Clostridium Difficile by PCR     Status: None   Collection Time: 12/02/14  6:45 AM  Result Value Ref Range Status   C difficile by pcr NEGATIVE NEGATIVE Final     Studies:  Recent x-ray studies have been reviewed in detail by the Attending Physician  Scheduled Meds:  Scheduled Meds: . aspirin  81 mg Oral Daily  . azithromycin  500 mg Intravenous Q24H  . budesonide  0.25 mg Nebulization BID  . cefTRIAXone (ROCEPHIN)  IV  1 g Intravenous Q24H  . docusate sodium  100 mg Oral BID  . donepezil  5 mg Oral QHS  . folic acid  1 mg Oral Daily  . heparin  5,000 Units  Subcutaneous 3 times per day  . ipratropium-albuterol  3 mL Nebulization QID  . multivitamin with minerals  1 tablet Oral Daily  . nicotine  14 mg Transdermal Daily  . QUEtiapine  100 mg Oral QHS  . QUEtiapine  25 mg Oral BID  . thiamine  100 mg Oral Daily    Time spent on care of this patient: 40 mins   Dre[facility]  [facility] Office  [phone] Pager - [phone]  On-Call/Text Page:      [email or web address]      password [email or web address]  If 7PM-7AM, please contact night-coverage [email or web address] Password [email or web address] 12/07/2014, 9:24 AM   LOS: 6 days   Care during the described time interval was provided by me .  I have reviewed this patient's available data, including medical history, events of note, physical examination, radiology studies and test results as part of my evaluation  Carolyne Littles, MD [phone] Pager

## 2014-12-07 NOTE — Progress Notes (Signed)
OT Cancellation Note  Patient Details Name: [name] MRN: [id number] DOB: 1932/02/27   Cancelled Treatment:    Reason Eval/Treat Not Completed: Other (comment). Pt eating dinner. Plan to evaluate tomorrow.  Earlie RavelingStraub, Farrie Sann L OTR/L [phone] 12/07/2014, 4:50 PM

## 2014-12-07 NOTE — Progress Notes (Signed)
[place] TEAM 1 - Stepdown/ICU TEAM Progress Note  [name] ZOX:[id number] DOB: 1932/04/29 DOA: 12/01/2014 PCP: Thora Lance, MD  Admit HPI / Brief Narrative: 81 yo WM PMHx COPD (on home O2 - pt unsure how much), bronchiectasis, prior L lung collapse, dementia, and anxiety who was brought to the ED by family for shortness of breath and altered mental status. In ED, pt was felt be experiencing a COPD exacerbation; CXR noted chronic bronchiectasis and R lobe opacity; ABG with evidence of hypercarbic respiratory failure. Patient was admitted to SDU, placed on BiPAP, and started on IV abx. Pt has been transitioned to [place].PCCM and ID have been consulted for collapsed left lung, with no further recommendations due to extensive chronic lung disease.  HPI/Subjective: 1/30 A/O 1 (does not know where, when, why), however does recognize his daughter and granddaughter  Assessment/Plan: Acute on chronic Hypoxic Respiratory failure with hypercarbia secondary to COPD exacerbation and chronic L lung collapse - chronic bronchiectasis  -Pt on 2L [place] with 100% O2 sats -Tapered off steroids -Continue Duoneb, Pulmicort -Flutter valve -PCCM consulted for collapsed left lung- no further tx- left lung with extensive chronic damage.  -ID- NO recs for chronic abx suppression - [employer] sputum cx pending -Continue supplemental O2 PRN, BiPAP PRN  CAP -CXR with opacity in R lung with possibility of viral/atypical lung infection -Continue IV Azithromycin and Rocephin for full ten-day course of antibiotic -Influenza negative; Legionella/ Strep pneumo urine antigen negative -blood/sputum cxs no growth to date -ABG pending  Altered mental status - acute encephalopathy  -Was likely multifactorial to include to encephalopathy, CAP, dementia   Pt with safety mittens and sitter at bedside -CT head with no acute abnormalities  Dementia -Continue Aricept -PT/OT consult pending  Mildly elevated  Troponin -Trop elevated, downtrended - likely secondary to demand ischemia. -Not candidate for any intervention due to end stage lung dz -Pt with Hx of CAD, s/p cardiac cath -Continue ASA 81 daily  HTN -Hold all BP medication; patient hypotensive for a 81 yo   Pulmonary arterial hypertension -CT chest 12/02/13- dilated pulmonic trunk -FU with PCP   Code Status: FULL Family Communication: no family present at time of exam Disposition Plan: ??    Consultants: ID-Dr. Judyann Munson PCCM-Dr. Rory Percy   Procedure/Significant Events: 1/25 CT head without contrast;Atrophy and chronic ischemic white matter disease without acute intracranial abnormality. 1/26 CT chest without contrast;-progressively worsened widespread bronchiectasis in the lungs bilaterally. Most severe on the left side where there is extensive varicose and cystic bronchiectasis with complete chronic collapse and consolidation of the left lung. F -Findings presumably related to a chronic infection such as MAI (mycobacterium avium intracellulare) or other atypical organism.    Culture 1/26 C. difficile by PCR negative Blood-no growth to date Urine- no growth Sputum-pending  Antibiotics: IV Azithromycin 1/25>> IV Rocephin 1/25 >>  DVT prophylaxis: SQ Heparin   Devices NA  LINES / TUBES:    Continuous Infusions: . sodium chloride 50 mL/hr at 12/07/14 0400    Objective: VITAL SIGNS: Temp: 98.5 F (36.9 C) (01/31 0801) Temp Source: Oral (01/31 0801) BP: 137/54 mmHg (01/31 0800) Pulse Rate: 65 (01/31 0800) SPO2; FIO2:   Intake/Output Summary (Last 24 hours) at 12/07/14 0832 Last data filed at 12/07/14 0640  Gross per 24 hour  Intake 1295.83 ml  Output    900 ml  Net 395.83 ml     Exam: General: A/O 1 (does not know where, when, why), however does recognize his  daughter and granddaughter. NAD, No acute respiratory distress Lungs: left lung diffusely rhonchorous with  decreased air movement, right lung fields decreased air movement with expiratory wheezing, negative crackles Cardiovascular: Regular rate and rhythm without murmur gallop or rub normal S1 and S2 Abdomen: Nontender, nondistended, soft, bowel sounds positive, no rebound, no ascites, no appreciable mass Extremities: No significant cyanosis, clubbing, or edema bilateral lower extremities  Data Reviewed: Basic Metabolic Panel:  Recent Labs Lab 12/02/14 0820 12/03/14 0250 12/04/14 0522 12/05/14 0422 12/07/14 0300  NA 138 138 139 141 136  K 3.7 3.3* 3.9 3.4* 3.4*  CL 97 97 98 100 99  CO2 29 28 36* 40* 31  GLUCOSE 151* 132* 120* 91 94  BUN CREATININE 0.84 0.71 0.64 0.64 0.65  CALCIUM 8.6 8.7 8.3* 8.0* 7.9*  MG  --  2.5  --   --  2.4   Liver Function Tests:  Recent Labs Lab 12/02/14 0820 12/07/14 0300  AST 127* 90*  ALT 30 31  ALKPHOS 65 56  BILITOT 0.4 0.5  PROT 6.9 5.8*  ALBUMIN 3.3* 2.5*   No results for input(s): LIPASE, AMYLASE in the last 168 hours. No results for input(s): AMMONIA in the last 168 hours. CBC:  Recent Labs Lab 12/02/14 0820 12/03/14 0250 12/04/14 0522 12/05/14 0422 12/07/14 0300  WBC 6.5 10.7* 9.9 6.5 11.0*  NEUTROABS  --   --   --   --  8.7*  HGB 12.0* 11.5* 10.6* 9.9* 10.4*  HCT 37.9* 36.5* 34.3* 32.0* 32.2*  MCV 84.6 83.7 86.6 85.6 83.6  PLT 185 207 210 204 194   Cardiac Enzymes:  Recent Labs Lab 12/01/14 2044 12/02/14 0323 12/02/14 0820  TROPONINI 0.39* 0.26* 0.19*   BNP (last 3 results) No results for input(s): PROBNP in the last 8760 hours. CBG:  Recent Labs Lab 12/02/14 1610 12/03/14 0814 12/04/14 0737 12/04/14 1126 12/05/14 0823  GLUCAP 130* 98 101* 99 78    Recent Results (from the past 240 hour(s))  Blood culture (routine x 2)     Status: None (Preliminary result)   Collection Time: 12/01/14  6:00 PM  Result Value Ref Range Status   Specimen Description BLOOD ARM LEFT  Final   Special Requests  BOTTLES DRAWN AEROBIC AND ANAEROBIC 10CC  Final   Culture   Final           BLOOD CULTURE RECEIVED NO GROWTH TO DATE CULTURE WILL BE HELD FOR 5 DAYS BEFORE ISSUING A FINAL NEGATIVE REPORT Performed at [facility]    Report Status PENDING  Incomplete  Blood culture (routine x 2)     Status: None (Preliminary result)   Collection Time: 12/01/14  6:15 PM  Result Value Ref Range Status   Specimen Description BLOOD HAND LEFT  Final   Special Requests BOTTLES DRAWN AEROBIC AND ANAEROBIC 10CC  Final   Culture   Final           BLOOD CULTURE RECEIVED NO GROWTH TO DATE CULTURE WILL BE HELD FOR 5 DAYS BEFORE ISSUING A FINAL NEGATIVE REPORT Performed at [facility]    Report Status PENDING  Incomplete  MRSA PCR Screening     Status: None   Collection Time: 12/01/14  8:16 PM  Result Value Ref Range Status   MRSA by PCR NEGATIVE NEGATIVE Final    Comment:        The GeneXpert MRSA Assay (FDA approved for NASAL specimens only), is one  component of a comprehensive MRSA colonization surveillance program. It is not intended to diagnose MRSA infection nor to guide or monitor treatment for MRSA infections.   Clostridium Difficile by PCR     Status: None   Collection Time: 12/02/14  6:45 AM  Result Value Ref Range Status   C difficile by pcr NEGATIVE NEGATIVE Final     Studies:  Recent x-ray studies have been reviewed in detail by the Attending Physician  Scheduled Meds:  Scheduled Meds: . aspirin  81 mg Oral Daily  . azithromycin  500 mg Intravenous Q24H  . budesonide  0.25 mg Nebulization BID  . cefTRIAXone (ROCEPHIN)  IV  1 g Intravenous Q24H  . docusate sodium  100 mg Oral BID  . donepezil  5 mg Oral QHS  . folic acid  1 mg Oral Daily  . heparin  5,000 Units Subcutaneous 3 times per day  . ipratropium-albuterol  3 mL Nebulization QID  . multivitamin with minerals  1 tablet Oral Daily  . nicotine  14 mg Transdermal Daily  . QUEtiapine  100 mg Oral QHS  .  QUEtiapine  25 mg Oral BID  . thiamine  100 mg Oral Daily    Time spent on care of this patient: 40 mins   Drema DallasWOODS, CURTIS, J , [facility]  [facility] Office  [phone] Pager - [phone]  On-Call/Text Page:      [email or web address]      password [email or web address]  If 7PM-7AM, please contact night-coverage [email or web address] Password [email or web address] 12/07/2014, 8:32 AM   LOS: 6 days   Care during the described time interval was provided by me .  I have reviewed this patient's available data, including medical history, events of note, physical examination, radiology studies and test results as part of my evaluation  Carolyne Littlesurtis Woods, MD [phone] Pager

## 2014-12-08 DIAGNOSIS — G9340 Encephalopathy, unspecified: Secondary | ICD-10-CM

## 2014-12-08 LAB — CBC WITH DIFFERENTIAL/PLATELET
BASOS PCT: 0 % (ref 0–1)
Basophils Absolute: 0 10*3/uL (ref 0.0–0.1)
EOS ABS: 0.5 10*3/uL (ref 0.0–0.7)
Eosinophils Relative: 6 % — ABNORMAL HIGH (ref 0–5)
HCT: 31 % — ABNORMAL LOW (ref 39.0–52.0)
Hemoglobin: 9.9 g/dL — ABNORMAL LOW (ref 13.0–17.0)
LYMPHS PCT: 10 % — AB (ref 12–46)
Lymphs Abs: 0.9 10*3/uL (ref 0.7–4.0)
MCH: 26.4 pg (ref 26.0–34.0)
MCHC: 31.9 g/dL (ref 30.0–36.0)
MCV: 82.7 fL (ref 78.0–100.0)
MONO ABS: 1 10*3/uL (ref 0.1–1.0)
MONOS PCT: 11 % (ref 3–12)
NEUTROS ABS: 7 10*3/uL (ref 1.7–7.7)
NEUTROS PCT: 73 % (ref 43–77)
PLATELETS: 220 10*3/uL (ref 150–400)
RBC: 3.75 MIL/uL — AB (ref 4.22–5.81)
RDW: 14.9 % (ref 11.5–15.5)
WBC: 9.5 10*3/uL (ref 4.0–10.5)

## 2014-12-08 LAB — LEGIONELLA ANTIGEN, URINE

## 2014-12-08 LAB — CULTURE, BLOOD (ROUTINE X 2)
CULTURE: NO GROWTH
Culture: NO GROWTH

## 2014-12-08 LAB — COMPREHENSIVE METABOLIC PANEL
ALT: 27 U/L (ref 0–53)
AST: 78 U/L — AB (ref 0–37)
Albumin: 2.4 g/dL — ABNORMAL LOW (ref 3.5–5.2)
Alkaline Phosphatase: 58 U/L (ref 39–117)
Anion gap: 5 (ref 5–15)
BUN: 7 mg/dL (ref 6–23)
CO2: 32 mmol/L (ref 19–32)
CREATININE: 0.6 mg/dL (ref 0.50–1.35)
Calcium: 7.9 mg/dL — ABNORMAL LOW (ref 8.4–10.5)
Chloride: 100 mmol/L (ref 96–112)
GFR calc Af Amer: >90 mL/min (ref >90)
Glucose, Bld: 90 mg/dL (ref 70–99)
POTASSIUM: 3.5 mmol/L (ref 3.5–5.1)
Sodium: 137 mmol/L (ref 135–145)
Total Bilirubin: 0.4 mg/dL (ref 0.3–1.2)
Total Protein: 5.7 g/dL — ABNORMAL LOW (ref 6.0–8.3)

## 2014-12-08 LAB — MAGNESIUM: Magnesium: 2.5 mg/dL (ref 1.5–2.5)

## 2014-12-08 LAB — STREP PNEUMONIAE URINARY ANTIGEN: Strep Pneumo Urinary Antigen: NEGATIVE

## 2014-12-08 MED ORDER — AMOXICILLIN-POT CLAVULANATE 875-125 MG PO TABS
1.0000 | ORAL_TABLET | Freq: Two times a day (BID) | ORAL | Status: DC
  Administered 2014-12-08 – 2014-12-09 (×3): 1 via ORAL
  Filled 2014-12-08 (×4): qty 1

## 2014-12-08 MED ORDER — IPRATROPIUM-ALBUTEROL 0.5-2.5 (3) MG/3ML IN SOLN: 3.0000 mL | RESPIRATORY_TRACT | Status: DC | PRN

## 2014-12-08 NOTE — Progress Notes (Addendum)
Pt has not had a sitter this evening. He has been pleasant and has tried to get out of the bed a few times but has generally been cooperative. Daughter, [name],  called and requested we continue to care for him without a sitter so he can go to SNF tomorrow. Has been incontinent of urine several times. Sat in chair while bed changed. Pt was very unsteady while standing and was able to take only two steps.

## 2014-12-08 NOTE — Progress Notes (Signed)
OT Cancellation Note  Patient Details Name: Lowell GuitarMack Galvan MRN: [id number] DOB: Jan 17, 1932   Cancelled Treatment:    Reason Eval/Treat Not Completed: Other (comment) Pt is Medicare and current D/C plan is SNF. No apparent immediate acute care OT needs, therefore will defer OT to SNF. If OT eval is needed please call Acute Rehab Dept. at [phone] or text page OT at [phone].    Evette GeorgesLeonard, Keeara Frees Eva [phone] 12/08/2014, 7:26 AM
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
## 2014-12-08 NOTE — Clinical Social Work Placement (Addendum)
Clinical Social Work Department CLINICAL SOCIAL WORK PLACEMENT NOTE 12/08/2014  Patient:  [name],[name]  Account Number:  [id number] Admit date:  12/01/2014  Clinical Social Worker:  Merlyn LotJENNA HOLOMAN, CLINICAL SOCIAL WORKER  Date/time:  12/08/2014 08:40 AM  Clinical Social Work is seeking post-discharge placement for this patient at the following level of care:   SKILLED NURSING   (*CSW will update this form in Epic as items are completed)   12/08/2014  [facility]/family provided with [facility]'s list of facilities offering this level of care within the geographic area requested by the patient (or if unable, by the patient's family).  12/08/2014  Patient/family informed of their freedom to choose among providers that offer the needed level of care, that participate in Medicare, Medicaid or managed care program needed by the patient, have an available bed and are willing to accept the patient.  12/08/2014  Patient/family informed of [facility]' ownership interest in [facility], as well as of the fact that they are under no obligation to receive care at this facility.  PASARR submitted to EDS on 12/08/2014 PASARR number received on 12/08/2014  FL2 transmitted to all facilities in geographic area requested by pt/family on  12/08/2014 FL2 transmitted to all facilities within larger geographic area on   Patient informed that his/her managed care company has contracts with or will negotiate with  certain facilities, including the following:     Patient/family informed of bed offers received:  12/09/2014 Marcelline Deist(Jacoya Bauman, Theresia MajorsLCSWA) Patient chooses bed at [facility] Marcelline Deist(Jamai Dolce, [place]) Physician recommends and patient chooses bed at    Patient to be transferred to  [facility] on  12/09/2014 Marcelline Deist(Evellyn Tuff, [facility]) Patient to be transferred to facility by [facility] Marcelline Deist(Javana Schey, LCSWA) Patient and family notified of transfer on   12/09/2014 Marcelline Deist(Braley Luckenbaugh, Theresia MajorsLCSWA) Name of family member notified:  Patient's daughter, Inetta Fermoina, update regarding discharge. Lily Kocher(Kimothy Kishimoto, LCSWA)  The following physician request were entered in Epic:   Additional Comments: Merlyn LotJenna Holoman, [facility] LPCSWA Clinical Social Worker [phone]  Marcelline Deistmily Cayce Quezada, [place] [phone]) Licensed Clinical Social Worker Orthopedics [phone]([id number]) and Surgical [phone]([id number])

## 2014-12-08 NOTE — Progress Notes (Signed)
[facility] PROGRESS NOTE  [name] ZOX:[id number] DOB: [birth year] DOA: 12/01/2014 PCP: Thora LanceEHINGER,ROBERT R, MD   Brief narrative 81 year old male with history of COPD on home O2, bronchiectasis, history of left lung collapse, dementia and anxiety was brought to the ED by family for altered mental status and shortness of breath. In the ED chest x-ray was noted for bronchiectasis and right lobe opacity, ABG showing hypercarbic respiratory failure. Patient was thought to have acute COPD exacerbation and was admitted to stepdown unit and placed on BiPAP. He was started on empiric IV antibiotics. PCCM and ID consulted for left lung collapse . Pt transferred to medical floor.   Assessment/Plan: Acute on chronic hypoxic and hypercarbic respiratory failure Secondary to COPD exacerbation, CAP and chronic left lung collapse with underlying bronchiectasis Patient maintaining O2 sat on 2 L via nasal cannula -Continue DuoNeb, Pulmicort and flutter valve -No further recommendations per PC CM for collapse left lung. Extensive chronic changes. -No  recommendations for chronic antibiotic suppression per ID -Quantiferon test pending. -Skilled nursing facility for PT. Possibly discharge tomorrow.  Community acquired pneumonia Right lung opacity seen on chest x-ray. On empiric Rocephin and azithromycin. Switch to oral Augmentin to complete ten-day course. ( last day of abx 2/3). Cultures have been negative. Urine for strep and Legionella antigen negative. Flu PCR negative. Patient remains afebrile and maintaining O2 sat.  Acute encephalopathy Multifactorial in the setting of pneumonia/COPD exacerbation and worsening dementia. She is still very confused and agitated at times , hallucinating and talking to people not in the room. CT head unremarkable. On seroquel already. -Continue Aricept. Continue when necessary Klonopin for anxiety.  Mild troponin elevation Secondary to demand ischemia. History of  cardiac cath status with underlying CAD New daily aspirin.  Hypertension Blood pressure medications held as patient hypertensive.   Pulmonary artery hypertension Follow-up as outpatient  DVT prophylaxis: Subcutaneous heparin   Diet: Dysphagia level II  Code Status: DO NOT RESUSCITATE Family Communication: None at bedside Disposition Plan: Possibly skilled nursing facility on 2/2   Consultants:  PC CM  ID  Procedures:  None  Antibiotics:  Rocephin and azithromycin since 1/25--2/1  augmentin 2/1-2/3  HPI/Subjective: Pt seen and examined. Has been confused but has not required sitter.  Objective: Filed Vitals:   12/08/14 0630  BP: 148/58  Pulse: 67  Temp: 98.8 F (37.1 C)  Resp: 18    Intake/Output Summary (Last 24 hours) at 12/08/14 1405 Last data filed at 12/08/14 0800  Gross per 24 hour  Intake 2023.33 ml  Output   1100 ml  Net 923.33 ml   Filed Weights   12/01/14 2010 12/03/14 0047 12/07/14 1210  Weight: 70.3 kg (154 lb 15.7 oz) 71.2 kg (156 lb 15.5 oz) 71.2 kg (156 lb 15.5 oz)    Exam:   General:  Elderly male in NAD. Confused  HEENT: No pallor, moist oral mucosa,  Chest: Clear to auscultation bilaterally, no added sounds  Cardiovascular: NS1&S2, no murmurs  Abdomen: soft, NT, ND, BS+  Musculoskeletal: warm, no edema  CNS: AAOX1, confused  Data Reviewed: Basic Metabolic Panel:  Recent Labs Lab 12/03/14 0250 12/04/14 0522 12/05/14 0422 12/07/14 0300 12/08/14 0610  NA 138 139 141 136 137  K 3.3* 3.9 3.4* 3.4* 3.5  CL 97 98 100 99 100  CO2 28 36* 40* 31 32  GLUCOSE 132* 120* 91 94 90  BUN 18 16 16 7 7   CREATININE 0.71 0.64 0.64 0.65 0.60  CALCIUM 8.7 8.3* 8.0* 7.9* 7.9*  MG 2.5  --   --  2.4 2.5   Liver Function Tests:  Recent Labs Lab 12/02/14 0820 12/07/14 0300 12/08/14 0610  AST 127* 90* 78*  ALT ALKPHOS 65 56 58  BILITOT 0.4 0.5 0.4  PROT 6.9 5.8* 5.7*  ALBUMIN 3.3* 2.5* 2.4*   No results for  input(s): LIPASE, AMYLASE in the last 168 hours. No results for input(s): AMMONIA in the last 168 hours. CBC:  Recent Labs Lab 12/03/14 0250 12/04/14 0522 12/05/14 0422 12/07/14 0300 12/08/14 0610  WBC 10.7* 9.9 6.5 11.0* 9.5  NEUTROABS  --   --   --  8.7* 7.0  HGB 11.5* 10.6* 9.9* 10.4* 9.9*  HCT 36.5* 34.3* 32.0* 32.2* 31.0*  MCV 83.7 86.6 85.6 83.6 82.7  PLT 207 210 204 194 220   Cardiac Enzymes:  Recent Labs Lab 12/01/14 2044 12/02/14 0323 12/02/14 0820  TROPONINI 0.39* 0.26* 0.19*   BNP (last 3 results) No results for input(s): PROBNP in the last 8760 hours. CBG:  Recent Labs Lab 12/02/14 1610 12/03/14 0814 12/04/14 0737 12/04/14 1126 12/05/14 0823  GLUCAP 130* 98 101* 99 78    Recent Results (from the past 240 hour(s))  Blood culture (routine x 2)     Status: None   Collection Time: 12/01/14  6:00 PM  Result Value Ref Range Status   Specimen Description BLOOD ARM LEFT  Final   Special Requests BOTTLES DRAWN AEROBIC AND ANAEROBIC 10CC  Final   Culture   Final    NO GROWTH 5 DAYS Performed at [facility]    Report Status 12/08/2014 FINAL  Final  Blood culture (routine x 2)     Status: None   Collection Time: 12/01/14  6:15 PM  Result Value Ref Range Status   Specimen Description BLOOD HAND LEFT  Final   Special Requests BOTTLES DRAWN AEROBIC AND ANAEROBIC 10CC  Final   Culture   Final    NO GROWTH 5 DAYS Performed at [facility]    Report Status 12/08/2014 FINAL  Final  MRSA PCR Screening     Status: None   Collection Time: 12/01/14  8:16 PM  Result Value Ref Range Status   MRSA by PCR NEGATIVE NEGATIVE Final    Comment:        The GeneXpert MRSA Assay (FDA approved for NASAL specimens only), is one component of a comprehensive MRSA colonization surveillance program. It is not intended to diagnose MRSA infection nor to guide or monitor treatment for MRSA infections.   Clostridium Difficile by PCR     Status: None    Collection Time: 12/02/14  6:45 AM  Result Value Ref Range Status   C difficile by pcr NEGATIVE NEGATIVE Final     Studies: No results found.  Scheduled Meds: . aspirin  81 mg Oral Daily  . azithromycin  500 mg Intravenous Q24H  . budesonide  0.25 mg Nebulization BID  . cefTRIAXone (ROCEPHIN)  IV  1 g Intravenous Q24H  . docusate sodium  100 mg Oral BID  . donepezil  5 mg Oral QHS  . folic acid  1 mg Oral Daily  . heparin  5,000 Units Subcutaneous 3 times per day  . multivitamin with minerals  1 tablet Oral Daily  . nicotine  14 mg Transdermal Daily  . QUEtiapine  100 mg Oral QHS  . QUEtiapine  25 mg Oral BID  . thiamine  100 mg Oral Daily   Continuous  Infusions: . sodium chloride 50 mL/hr at 12/08/14 0604      Time spent: 25 minutes    Eddie North  [facility] Pager [phone] If 7PM-7AM, please contact night-coverage at [email or web address], password [facility] 12/08/2014, 2:05 PM  LOS: 7 days

## 2014-12-09 DIAGNOSIS — G9340 Encephalopathy, unspecified: Secondary | ICD-10-CM | POA: Diagnosis present

## 2014-12-09 DIAGNOSIS — F03C0 Unspecified dementia, severe, without behavioral disturbance, psychotic disturbance, mood disturbance, and anxiety: Secondary | ICD-10-CM | POA: Diagnosis present

## 2014-12-09 DIAGNOSIS — F0390 Unspecified dementia without behavioral disturbance: Secondary | ICD-10-CM | POA: Diagnosis present

## 2014-12-09 LAB — CBC WITH DIFFERENTIAL/PLATELET
Basophils Absolute: 0 10*3/uL (ref 0.0–0.1)
Basophils Relative: 0 % (ref 0–1)
Eosinophils Absolute: 0.4 10*3/uL (ref 0.0–0.7)
Eosinophils Relative: 6 % — ABNORMAL HIGH (ref 0–5)
HCT: 32.5 % — ABNORMAL LOW (ref 39.0–52.0)
Hemoglobin: 10.4 g/dL — ABNORMAL LOW (ref 13.0–17.0)
Lymphocytes Relative: 12 % (ref 12–46)
Lymphs Abs: 0.9 10*3/uL (ref 0.7–4.0)
MCH: 26.5 pg (ref 26.0–34.0)
MCHC: 32 g/dL (ref 30.0–36.0)
MCV: 82.7 fL (ref 78.0–100.0)
MONOS PCT: 14 % — AB (ref 3–12)
Monocytes Absolute: 1 10*3/uL (ref 0.1–1.0)
NEUTROS ABS: 4.9 10*3/uL (ref 1.7–7.7)
NEUTROS PCT: 68 % (ref 43–77)
Platelets: 236 10*3/uL (ref 150–400)
RBC: 3.93 MIL/uL — AB (ref 4.22–5.81)
RDW: 14.6 % (ref 11.5–15.5)
WBC: 7.2 10*3/uL (ref 4.0–10.5)

## 2014-12-09 LAB — COMPREHENSIVE METABOLIC PANEL
ALBUMIN: 2.6 g/dL — AB (ref 3.5–5.2)
ALT: 28 U/L (ref 0–53)
AST: 79 U/L — ABNORMAL HIGH (ref 0–37)
Alkaline Phosphatase: 59 U/L (ref 39–117)
Anion gap: 10 (ref 5–15)
BILIRUBIN TOTAL: 0.3 mg/dL (ref 0.3–1.2)
BUN: 6 mg/dL (ref 6–23)
CO2: 25 mmol/L (ref 19–32)
Calcium: 8.1 mg/dL — ABNORMAL LOW (ref 8.4–10.5)
Chloride: 103 mmol/L (ref 96–112)
Creatinine, Ser: 0.61 mg/dL (ref 0.50–1.35)
GFR calc Af Amer: >90 mL/min (ref >90)
Glucose, Bld: 92 mg/dL (ref 70–99)
Potassium: 3.5 mmol/L (ref 3.5–5.1)
Sodium: 138 mmol/L (ref 135–145)
Total Protein: 6.3 g/dL (ref 6.0–8.3)

## 2014-12-09 LAB — MAGNESIUM: Magnesium: 2.4 mg/dL (ref 1.5–2.5)

## 2014-12-09 MED ORDER — CLONAZEPAM 0.5 MG PO TABS: 0.5000 mg | ORAL_TABLET | Freq: Three times a day (TID) | ORAL | Status: AC | PRN

## 2014-12-09 MED ORDER — THIAMINE HCL 100 MG PO TABS: 100.0000 mg | ORAL_TABLET | Freq: Every day | ORAL | Status: AC

## 2014-12-09 MED ORDER — FOLIC ACID 1 MG PO TABS: 1.0000 mg | ORAL_TABLET | Freq: Every day | ORAL | Status: AC

## 2014-12-09 MED ORDER — AMOXICILLIN-POT CLAVULANATE 875-125 MG PO TABS: 1.0000 | ORAL_TABLET | Freq: Two times a day (BID) | ORAL | Status: AC

## 2014-12-09 MED ORDER — DONEPEZIL HCL 10 MG PO TABS: 10.0000 mg | ORAL_TABLET | Freq: Every day | ORAL | Status: AC

## 2014-12-09 MED ORDER — DOCUSATE SODIUM 100 MG PO CAPS: 100.0000 mg | ORAL_CAPSULE | Freq: Two times a day (BID) | ORAL | Status: AC

## 2014-12-09 MED ORDER — QUETIAPINE FUMARATE 100 MG PO TABS: 100.0000 mg | ORAL_TABLET | Freq: Every day | ORAL | Status: DC

## 2014-12-09 MED ORDER — ALPRAZOLAM 0.5 MG PO TABS: 0.5000 mg | ORAL_TABLET | Freq: Every evening | ORAL | Status: AC | PRN

## 2014-12-09 MED ORDER — QUETIAPINE FUMARATE 100 MG PO TABS: 100.0000 mg | ORAL_TABLET | Freq: Every evening | ORAL | Status: AC | PRN

## 2014-12-09 MED ORDER — QUETIAPINE FUMARATE 25 MG PO TABS
25.0000 mg | ORAL_TABLET | Freq: Two times a day (BID) | ORAL | Status: AC
Start: 2014-12-09 — End: ?

## 2014-12-09 NOTE — Discharge Summary (Addendum)
Physician Discharge Summary  [name] ZOX:[id number] DOB: [birth year] DOA: 12/01/2014  PCP: Thora Lance, MD  Admit date: 12/01/2014 Discharge date: 12/09/2014  Time spent: 35 minutes  Recommendations for Outpatient Follow-up:  1. Discharged to skilled nursing facility 2. Completes antibiotic course on 12/10/2014 3. Please make adjustment to medication based on change or improvement his encephalopathy.  4. Please follow result of quantiferon TB assay sent on 12/05/2014.  Discharge Diagnoses:    Principal problem Acute respiratory failure with hypoxemia and hypercapnia  Active Problems:  Bronchiectasis with acute exacerbation Community acquired pneumonia Acute encephalopathy Advanced dementia   Hypertension, essential   Anxiety disorder   Dementia   Collapse of left lung   Elevated troponin   Essential hypertension   Pulmonary hypertension    Discharge Condition: Fair  Diet recommendation:  Dysphagia level II with thin liquid  Code Status: DO NOT RESUSCITATE  Filed Weights   12/01/14 2010 12/03/14 0047 12/07/14 1210  Weight: 70.3 kg (154 lb 15.7 oz) 71.2 kg (156 lb 15.5 oz) 71.2 kg (156 lb 15.5 oz)    History of present illness:  Please refer to admission H&P for details, in brief, 81 year old male with history of COPD on home O2, bronchiectasis, history of left lung collapse, dementia and anxiety was brought to the ED by family for altered mental status and shortness of breath. In the ED chest x-ray was noted for bronchiectasis and right lobe opacity, ABG showing hypercarbic respiratory failure. Patient was thought to have acute COPD exacerbation and was admitted to stepdown unit and placed on BiPAP. He was started on empiric IV antibiotics. PCCM and ID consulted for left lung collapse . Pt transferred to medical floor.  Hospital Course:  Acute on chronic hypoxic and hypercarbic respiratory failure Secondary to acute exacerbation of bronchiectasis, CAP and chronic  left lung collapse . Patient maintaining O2 sat on 2 L via nasal cannula -Continued on DuoNeb, Pulmicort and flutter valve -No further recommendations per PC CM for collapse left lung. Extensive chronic changes seen on imaging. -No recommendations for chronic antibiotic suppression per ID. -Quantiferon test pending and should be followed as outpatient. -Continue nebs as outpatient.  Community acquired pneumonia Right lung opacity seen on chest x-ray. On empiric Rocephin and azithromycin. Switched to oral Augmentin to complete ten-day course. ( last day of abx 2/3). Blood Cultures have been negative. Urine for strep and Legionella antigen negative. Flu PCR negative. Patient remains afebrile and maintaining O2 sat.  Acute encephalopathy with advanced dementia Multifactorial in the setting of pneumonia/COPD exacerbation and advanced dementia. Patient still very confused, occasionally agitated and hallucinating. As per daughter patient has had sundowning at home and his dementia has been progressive.  CT head unremarkable.  -Home dose Seroquel increased (was on  100 mg daily which was increased to 100 mg at night as needed and 25 mg placed daily to control his agitation and confusion. Continue when necessary Klonopin for anxiety and agitation and bedtime low-dose Xanax as needed for sleep. -I will increase his Aricept to 10 mg daily. -Seen by psychiatry consult and recommended to continue Seroquel. No further recommendations.  Mild troponin elevation Secondary to demand ischemia. History of cardiac cath status with underlying CAD Continue daily aspirin.  Hypertension Blood pressure medications held patient was hypotensive. HCTZ resumed upon discharge.   Pulmonary artery hypertension Follow-up as outpatient         Family Communication: spoke with daughter Inetta Fermo at length on the phone. She understands that patient is having progressive  dementia and they have  difficulty taking care of  him at home at present and is happy that he will be going to skilled nursing facility for now. Hospital course and further plan of care discussed with her in detail. She will be monitored at skilled nursing facility.  Disposition Plan: Discharge to skilled nursing facility   Consultants:  PC CM  ID  Psychiatry  Procedures:  None  Antibiotics:  Rocephin and azithromycin since 1/25--2/1  augmentin 2/1-2/3   Discharge Exam: Filed Vitals:   12/08/14 2208  BP: 151/71  Pulse: 73  Resp: 20    General: Elderly male sitting up in chair in NAD. Confused  HEENT: No pallor, moist oral mucosa, supple neck  Chest: Clear to auscultation bilaterally, no added sounds  Cardiovascular: NS1&S2, no murmurs  Gastrointestinal: soft, NT, ND, BS+  Musculoskeletal: warm, no edema  CNS: AAOX1, very confused, nonfocal exam  Discharge Instructions    Current Discharge Medication List    START taking these medications   Details  amoxicillin-clavulanate (AUGMENTIN) 875-125 MG per tablet Take 1 tablet by mouth every 12 (twelve) hours. Qty: 4 tablet, Refills: 0    docusate sodium (COLACE) 100 MG capsule Take 1 capsule (100 mg total) by mouth 2 (two) times daily. Qty: 10 capsule, Refills: 0    folic acid (FOLVITE) 1 MG tablet Take 1 tablet (1 mg total) by mouth daily. Qty: 30 tablet, Refills: 0    thiamine 100 MG tablet Take 1 tablet (100 mg total) by mouth daily. Qty: 30 tablet, Refills: 0      CONTINUE these medications which have CHANGED   Details  ALPRAZolam (XANAX) 0.5 MG tablet Take 1 tablet (0.5 mg total) by mouth at bedtime as needed for sleep. Qty: 30 tablet, Refills: 0    clonazePAM (KLONOPIN) 0.5 MG tablet Take 1 tablet (0.5 mg total) by mouth 3 (three) times daily as needed for anxiety. Qty: 30 tablet, Refills: 0    donepezil (ARICEPT) 10 MG tablet Take 1 tablet (10 mg total) by mouth at bedtime. Qty: 30 tablet, Refills: 0     QUEtiapine (SEROQUEL) 100 MG  tablet Take 1 tablet (100 mg total) by mouth at bedtime as needed (agitation). Qty: 30 tablet, Refills: 0     QUEtiapine (SEROQUEL) 25 MG tablet Take 1 tablet (25 mg total) by mouth 2 (two) times daily. Qty: 30 tablet, Refills: 0        CONTINUE these medications which have NOT CHANGED   Details  aspirin 81 MG tablet Take 81 mg by mouth daily.      GuaiFENesin (MUCUS RELIEF ADULT PO) Take 1 tablet by mouth daily.    hydrochlorothiazide 25 MG tablet Take 25 mg by mouth daily.     ipratropium-albuterol (DUONEB) 0.5-2.5 (3) MG/3ML SOLN Take 3 mLs by nebulization 2 (two) times daily.         No Known Allergies Follow-up Information    Follow up In 1 week.   Why:  MD at SNF       The results of significant diagnostics from this hospitalization (including imaging, microbiology, ancillary and laboratory) are listed below for reference.    Significant Diagnostic Studies: Dg Chest 2 View (if Patient Has Fever And/or Copd)  12/01/2014   CLINICAL DATA:  81 year old male with respiratory distress for 1 day. Initial encounter. Current history of chronic left lung bronchiectasis and collapse.  EXAM: CHEST  2 VIEW  COMPARISON:  Chest radiograph 05/14/2013 and earlier, including chest CT 09/28/2011.  FINDINGS: Chronic left lung volume loss with leftward shift of the mediastinum and extension of aerated right lung to the left of midline again noted. Since 2014, the relatively poor ventilation in the left lung has not significantly changed. No superimposed pneumothorax or pulmonary edema identified. No right pleural effusion. However, there is suggestion of subtle increased reticular nodular density in the right lung compared to prior studies. The tracheal air column appears stable. Cardiac and mediastinal contours are largely obscured as before. No acute osseous abnormality identified.  IMPRESSION: Chronic severe left lung disease with bronchiectasis and poor ventilation of that lung, not  significantly changed since 2014. Suggestion of superimposed reticulonodular opacity in the right lung such that acute viral/atypical right lung infection is possible.   Electronically Signed   By: Augusto Gamble M.D.   On: 12/01/2014 17:48   Ct Head Wo Contrast  12/01/2014   CLINICAL DATA:  Altered mental status.  Initial encounter.  EXAM: CT HEAD WITHOUT CONTRAST  TECHNIQUE: Contiguous axial images were obtained from the base of the skull through the vertex without intravenous contrast.  COMPARISON:  11/02/2011.  FINDINGS: The study was repeated 3 times due to motion during the scan. The study is diagnostic. No mass lesion, mass effect, midline shift, hydrocephalus, hemorrhage. No acute territorial cortical ischemia/infarct. Atrophy and chronic ischemic white matter disease is present. The calvarium appears intact. Grossly, the visible paranasal sinuses appear within normal limits. Dense intracranial atherosclerosis is present.  IMPRESSION: 1. Mildly motion degraded study. 2. Atrophy and chronic ischemic white matter disease without acute intracranial abnormality.   Electronically Signed   By: Andreas Newport M.D.   On: 12/01/2014 19:41   Ct Chest Wo Contrast  12/02/2014   CLINICAL DATA:  81 year old male with abnormal chest x-ray.  EXAM: CT CHEST WITHOUT CONTRAST  TECHNIQUE: Multidetector CT imaging of the chest was performed following the standard protocol without IV contrast.  COMPARISON:  Chest x-ray 12/01/2014.  Chest CT 09/28/2011.  FINDINGS: Mediastinum/Lymph Nodes: Shift of cardiomediastinal structures into the left hemithorax secondary to severe chronic left-sided volume loss. Heart size is normal. There is no significant pericardial fluid, thickening or pericardial calcification. There is atherosclerosis of the thoracic aorta, the great vessels of the mediastinum and the coronary arteries, including calcified atherosclerotic plaque in the left main, left anterior descending, left circumflex and right  coronary arteries. Dilatation of the pulmonic trunk (3.6 cm in diameter), suggestive of pulmonary arterial hypertension. Severe calcifications of the aortic valve. Multiple borderline enlarged and mildly enlarged mediastinal lymph nodes, measuring up to 11 mm in short axis in the low left paratracheal station. Esophagus is unremarkable in appearance.  Lungs/Pleura: Bronchiectatic changes in the left lung noted on the prior study have significantly increased, now with widespread varicose and cystic bronchiectasis throughout the entire left lung, which appears nearly completely chronically collapsed and consolidated. This is associated with extensive left-sided volume loss. To a much lesser extent there is diffuse bronchial wall thickening and mild cylindrical bronchiectasis throughout the right lung, with some areas of mild varicose bronchiectasis in the right lower lobe. This appears to be associated with significant air trapping, particularly in the right lower lobe. Extensive peribronchovascular micronodularity is noted throughout the right lung, presumably secondary to areas of mucoid impaction within terminal bronchioles. No confluent consolidative airspace disease in the right lung. No pleural effusions.  Musculoskeletal/Soft Tissues: There are no aggressive appearing lytic or blastic lesions noted in the visualized portions of the skeleton.  Upper Abdomen: Extensive atherosclerosis. Multiple  calcified granulomas in the liver and spleen.  IMPRESSION: 1. Compared to the prior study there has been progressively worsened widespread bronchiectasis in the lungs bilaterally. This is most severe on the left side where there is extensive varicose and cystic bronchiectasis with complete chronic collapse and consolidation of the left lung. Findings are presumably related to a chronic infection such as MAI (mycobacterium avium intracellulare) or other atypical organism. 2. Dilatation of the pulmonic trunk, suggesting  associated pulmonary arterial hypertension. 3. Atherosclerosis, including left main and 3 vessel coronary artery disease. 4. There are calcifications of the aortic valve. Echocardiographic correlation for evaluation of potential valvular dysfunction may be warranted if clinically indicated.   Electronically Signed   By: Trudie Reed M.D.   On: 12/02/2014 19:48    Microbiology: Recent Results (from the past 240 hour(s))  Blood culture (routine x 2)     Status: None   Collection Time: 12/01/14  6:00 PM  Result Value Ref Range Status   Specimen Description BLOOD ARM LEFT  Final   Special Requests BOTTLES DRAWN AEROBIC AND ANAEROBIC 10CC  Final   Culture   Final    NO GROWTH 5 DAYS Performed at [facility]    Report Status 12/08/2014 FINAL  Final  Blood culture (routine x 2)     Status: None   Collection Time: 12/01/14  6:15 PM  Result Value Ref Range Status   Specimen Description BLOOD HAND LEFT  Final   Special Requests BOTTLES DRAWN AEROBIC AND ANAEROBIC 10CC  Final   Culture   Final    NO GROWTH 5 DAYS Performed at [facility]    Report Status 12/08/2014 FINAL  Final  MRSA PCR Screening     Status: None   Collection Time: 12/01/14  8:16 PM  Result Value Ref Range Status   MRSA by PCR NEGATIVE NEGATIVE Final    Comment:        The GeneXpert MRSA Assay (FDA approved for NASAL specimens only), is one component of a comprehensive MRSA colonization surveillance program. It is not intended to diagnose MRSA infection nor to guide or monitor treatment for MRSA infections.   Clostridium Difficile by PCR     Status: None   Collection Time: 12/02/14  6:45 AM  Result Value Ref Range Status   C difficile by pcr NEGATIVE NEGATIVE Final     Labs: Basic Metabolic Panel:  Recent Labs Lab 12/03/14 0250 12/04/14 0522 12/05/14 0422 12/07/14 0300 12/08/14 0610 12/09/14 0700  NA 138 139 141 136 137 138  K 3.3* 3.9 3.4* 3.4* 3.5 3.5  CL 97 98 100 99 100 103   CO2 28 36* 40* 31 32 25  GLUCOSE 132* 120* 91 94 90 92  BUN CREATININE 0.71 0.64 0.64 0.65 0.60 0.61  CALCIUM 8.7 8.3* 8.0* 7.9* 7.9* 8.1*  MG 2.5  --   --  2.4 2.5 2.4   Liver Function Tests:  Recent Labs Lab 12/07/14 0300 12/08/14 0610 12/09/14 0700  AST 90* 78* 79*  ALT ALKPHOS 56 58 59  BILITOT 0.5 0.4 0.3  PROT 5.8* 5.7* 6.3  ALBUMIN 2.5* 2.4* 2.6*   No results for input(s): LIPASE, AMYLASE in the last 168 hours. No results for input(s): AMMONIA in the last 168 hours. CBC:  Recent Labs Lab 12/04/14 0522 12/05/14 0422 12/07/14 0300 12/08/14 0610 12/09/14 0700  WBC 9.9 6.5 11.0* 9.5 7.2  NEUTROABS  --   --  8.7* 7.0 4.9  HGB 10.6* 9.9* 10.4* 9.9* 10.4*  HCT 34.3* 32.0* 32.2* 31.0* 32.5*  MCV 86.6 85.6 83.6 82.7 82.7  PLT 210 204 194 220 236   Cardiac Enzymes: No results for input(s): CKTOTAL, CKMB, CKMBINDEX, TROPONINI in the last 168 hours. BNP: BNP (last 3 results) No results for input(s): BNP in the last 8760 hours.  ProBNP (last 3 results) No results for input(s): PROBNP in the last 8760 hours.  CBG:  Recent Labs Lab 12/02/14 1610 12/03/14 0814 12/04/14 0737 12/04/14 1126 12/05/14 0823  GLUCAP 130* 98 101* 99 78       Signed:  Pablo Mathurin  [facility] 12/09/2014, 11:23 AM

## 2014-12-09 NOTE — Discharge Planning (Signed)
Patient to be discharged to [facility]. Patient's daughter updated regarding discharge.  Facility: [facility] Report number: [phone] Transportation: EMS ([address])  Marcelline Deistmily Petr Bontempo, LCSWA [phone]) Licensed Clinical Social Worker Orthopedics [phone]([id number]) and Surgical [phone]([id number])

## 2014-12-10 LAB — QUANTIFERON TB GOLD ASSAY (BLOOD)

## 2014-12-10 LAB — QUANTIFERON IN TUBE
QFT TB AG MINUS NIL VALUE: 0.58 [IU]/mL
QUANTIFERON MITOGEN VALUE: 2.52 IU/mL
QUANTIFERON TB AG VALUE: 1.03 [IU]/mL
QUANTIFERON TB GOLD: POSITIVE — AB
Quantiferon Nil Value: 0.45 IU/mL

## 2015-02-06 DEATH — deceased
